# Patient Record
Sex: Male | Born: 1935
Health system: Southern US, Community
[De-identification: ages and names within clinical notes are randomized; demographics above are authoritative.]

## PROBLEM LIST (undated history)

## (undated) DIAGNOSIS — K219 Gastro-esophageal reflux disease without esophagitis: Secondary | ICD-10-CM

## (undated) DIAGNOSIS — R569 Unspecified convulsions: Secondary | ICD-10-CM

## (undated) DIAGNOSIS — M1990 Unspecified osteoarthritis, unspecified site: Secondary | ICD-10-CM

## (undated) DIAGNOSIS — R011 Cardiac murmur, unspecified: Secondary | ICD-10-CM

## (undated) DIAGNOSIS — I4891 Unspecified atrial fibrillation: Secondary | ICD-10-CM

## (undated) DIAGNOSIS — E785 Hyperlipidemia, unspecified: Secondary | ICD-10-CM

## (undated) DIAGNOSIS — I639 Cerebral infarction, unspecified: Secondary | ICD-10-CM

## (undated) HISTORY — PX: CARPAL TUNNEL RELEASE: SHX101

## (undated) HISTORY — DX: Unspecified atrial fibrillation: I48.91

## (undated) HISTORY — PX: TONSILLECTOMY: SUR1361

## (undated) HISTORY — DX: Unspecified convulsions: R56.9

## (undated) HISTORY — DX: Cardiac murmur, unspecified: R01.1

---

## 2000-10-10 ENCOUNTER — Ambulatory Visit (HOSPITAL_COMMUNITY): Admission: RE | Admit: 2000-10-10 | Discharge: 2000-10-10 | Payer: Self-pay | Admitting: *Deleted

## 2001-12-21 ENCOUNTER — Ambulatory Visit (HOSPITAL_COMMUNITY): Admission: RE | Admit: 2001-12-21 | Discharge: 2001-12-21 | Payer: Self-pay | Admitting: Family Medicine

## 2001-12-21 ENCOUNTER — Encounter: Payer: Self-pay | Admitting: Family Medicine

## 2001-12-22 ENCOUNTER — Ambulatory Visit: Admission: RE | Admit: 2001-12-22 | Discharge: 2001-12-22 | Payer: Self-pay | Admitting: Family Medicine

## 2002-01-02 ENCOUNTER — Encounter: Payer: Self-pay | Admitting: Family Medicine

## 2002-01-02 ENCOUNTER — Ambulatory Visit (HOSPITAL_COMMUNITY): Admission: RE | Admit: 2002-01-02 | Discharge: 2002-01-02 | Payer: Self-pay | Admitting: Family Medicine

## 2002-10-10 ENCOUNTER — Ambulatory Visit (HOSPITAL_COMMUNITY): Admission: RE | Admit: 2002-10-10 | Discharge: 2002-10-10 | Payer: Self-pay | Admitting: *Deleted

## 2002-10-10 ENCOUNTER — Encounter: Payer: Self-pay | Admitting: *Deleted

## 2004-02-06 ENCOUNTER — Encounter: Admission: RE | Admit: 2004-02-06 | Discharge: 2004-02-06 | Payer: Self-pay | Admitting: Neurology

## 2007-03-08 ENCOUNTER — Encounter (INDEPENDENT_AMBULATORY_CARE_PROVIDER_SITE_OTHER): Payer: Self-pay | Admitting: Urology

## 2008-04-22 ENCOUNTER — Encounter: Admission: RE | Admit: 2008-04-22 | Discharge: 2008-04-22 | Payer: Self-pay | Admitting: *Deleted

## 2010-12-25 NOTE — Op Note (Signed)
   NAME:  Nicholas Gaines, CORP NO.:  [id number]   MEDICAL RECORD NO.:  [id number]                   PATIENT TYPE:  AMB   LOCATION:  ENDO                                 FACILITY:  [facility]   PHYSICIAN:  Georgiana Spinner, M.D.                 DATE OF BIRTH:  [birth year]   DATE OF PROCEDURE:  10/10/2002  DATE OF DISCHARGE:                                 OPERATIVE REPORT   PROCEDURE:  Upper endoscopy.   ENDOSCOPIST:  Georgiana Spinner, M.D.   ANESTHESIA:  Demerol 40 mg, Versed 4 mg.   DESCRIPTION OF PROCEDURE:  With the patient mildly sedated in the left  lateral decubitus position, we attempted to do an endoscopy but we could get  the endoscope to pass through the upper esophageal sphincter.  Therefore,  after a number of tries we decided to stop the procedure and proceed with  upper GI.                                               Georgiana Spinner, M.D.    GMO/MEDQ  D:  10/10/2002  T:  10/10/2002  Job:  [id number]   cc:   Meredith Staggers, M.D.  [address]  [place]  [place] [address]  Fax: [phone]

## 2010-12-25 NOTE — Procedures (Signed)
[facility]  Patient:    Nicholas Gaines, Nicholas Gaines                       MRN: [id number] Proc. Date: 10/10/00 Adm. Date:  [id number] Attending:  Sabino Gasser CC:         Meredith Staggers, M.D.   Procedure Report  PROCEDURE:  Colonoscopy.  INDICATIONS:  [name] is a patient known to me in the past who is a former patient of Dr. Laurita Quint that is becoming a patient of Dr. Nettie Elm. He came to see me to tell me that he had been told of having recurrent iron deficiency anemia, therefore presumed heme-positive stools and because he has never had a colonoscopy, we suggested that he have one.  ANESTHESIA:  Demerol 40 mg, Versed 4 mg IV in divided doses.  DESCRIPTION OF PROCEDURE:  With the patient mildly sedated in the left lateral decubitus position, the Olympus videoscopic colonoscope was inserted into the rectum and passed under direct vision to the cecum after a normal rectal exam. The cecum was identified the ileocecal valve and appendiceal orifice, both of which were photographed.  We entered into the terminal ileum which also appeared normal and was photographed.  From this point the colonoscope was slowly withdrawn, taking circumferential views of the entire colonic mucosa stopping only in the rectum which appeared normal and the rectum showed internal hemorrhoids on retroflexed view.  The endoscope was straightened and withdrawn.  The patients vital signs and pulse oximeter readings were stable. The patient tolerated the procedure well without apparent complications.  FINDINGS: 1. Diverticulum of the sigmoid colon, mild. 2. Internal hemorrhoids, mild.  PLAN:  Follow-up with me will be as needed and with Dr. Dayton Scrape. DD:  10/10/00 TD:  10/11/00 Job: [id number]

## 2015-05-26 ENCOUNTER — Ambulatory Visit (INDEPENDENT_AMBULATORY_CARE_PROVIDER_SITE_OTHER): Payer: Medicare Other

## 2015-05-26 ENCOUNTER — Encounter: Payer: Self-pay | Admitting: Podiatry

## 2015-05-26 ENCOUNTER — Ambulatory Visit (INDEPENDENT_AMBULATORY_CARE_PROVIDER_SITE_OTHER): Payer: Medicare Other | Admitting: Podiatry

## 2015-05-26 VITALS — BP 102/75 | HR 66 | Resp 14 | Ht 66.0 in | Wt 160.0 lb

## 2015-05-26 DIAGNOSIS — M79671 Pain in right foot: Secondary | ICD-10-CM | POA: Diagnosis not present

## 2015-05-26 DIAGNOSIS — M1990 Unspecified osteoarthritis, unspecified site: Secondary | ICD-10-CM

## 2015-05-26 DIAGNOSIS — M19079 Primary osteoarthritis, unspecified ankle and foot: Secondary | ICD-10-CM

## 2015-05-26 NOTE — Progress Notes (Signed)
   Subjective:    Patient ID: Nicholas Gaines, male    DOB: 05/04/1929, 81 y.o.   MRN: [id number]  HPI  81 year old male presents the office today as referral from his insurance company , primary care doctor to evaluate a mass in the top of his right big toe. He states that he has had gout this toe several years ago after gout he noticed this mass for mallet toe. He states has remained unchanged from approximately 5 years and does not cause any pain. He is able to wear regular shoe without any difficulty. He denies any swelling . He denies any drainage.  No recent injury or trauma.No other complaints at this time.   Review of Systems  Musculoskeletal:       Joint pain Back pain   Hematological: Bruises/bleeds easily.  All other systems reviewed and are negative.      Objective:   Physical Exam General: AAO x3, NAD  Dermatological: Skin is warm, dry and supple bilateral. Nails x 10 are well manicured; remaining integument appears unremarkable at this time. There are no open sores, no preulcerative lesions, no rash or signs of infection present.  Vascular: Dorsalis Pedis artery and Posterior Tibial artery pedal pulses are 2/4 bilateral with immedate capillary fill time. Pedal hair growth present. No varicosities and no lower extremity edema present bilateral. There is no pain with calf compression, swelling, warmth, erythema.   Neruologic: Grossly intact via light touch bilateral. Vibratory intact via tuning fork bilateral. Protective threshold with Semmes Wienstein monofilament intact to all pedal sites bilateral. Patellar and Achilles deep tendon reflexes 2+ bilateral. No Babinski or clonus noted bilateral.   Musculoskeletal:  On the dorsal aspect of the right hallux IPJ there appears to be an arthritic exostosis palpable. There is no range of motion of the hallux IPJ however MPJ range of motion is intact. Hallux sits in the slightly contracted position. To smaller degree there is a small  bump on the left hallux IPJ as well.  There is no pain associated with the lesions. There is no areas of fluctuation or crepitation. No surrounding erythema, ascending cellulitis,  Malodor. No pain, crepitus, or limitation noted with foot and ankle range of motion bilateral. Muscular strength 5/5 in all groups tested bilateral.  Gait: Unassisted, Nonantalgic.       Assessment & Plan:   81 year old male with right hallux IPJ arthritis -X-rays were obtained and reviewed with the patient.  -Treatment options discussed including all alternatives, risks, and complications -Etiology of symptoms were discussed -At this point the lesion is in present for greater than 5 years does not cause any pain. Likely result of arthritis. We'll continue to monitor. If the area becomes more symptomatic or changed to call the office immediately.  Ovid CurdMatthew Ronasia Isola, DPM

## 2015-05-27 ENCOUNTER — Encounter: Payer: Self-pay | Admitting: Podiatry

## 2015-12-04 DIAGNOSIS — K219 Gastro-esophageal reflux disease without esophagitis: Secondary | ICD-10-CM | POA: Diagnosis not present

## 2015-12-04 DIAGNOSIS — E559 Vitamin D deficiency, unspecified: Secondary | ICD-10-CM | POA: Diagnosis not present

## 2015-12-04 DIAGNOSIS — E782 Mixed hyperlipidemia: Secondary | ICD-10-CM | POA: Diagnosis not present

## 2015-12-04 DIAGNOSIS — M1A0711 Idiopathic chronic gout, right ankle and foot, with tophus (tophi): Secondary | ICD-10-CM | POA: Diagnosis not present

## 2015-12-04 DIAGNOSIS — Z1389 Encounter for screening for other disorder: Secondary | ICD-10-CM | POA: Diagnosis not present

## 2015-12-04 DIAGNOSIS — N400 Enlarged prostate without lower urinary tract symptoms: Secondary | ICD-10-CM | POA: Diagnosis not present

## 2015-12-04 DIAGNOSIS — R7309 Other abnormal glucose: Secondary | ICD-10-CM | POA: Diagnosis not present

## 2015-12-04 DIAGNOSIS — Z23 Encounter for immunization: Secondary | ICD-10-CM | POA: Diagnosis not present

## 2015-12-04 DIAGNOSIS — R7303 Prediabetes: Secondary | ICD-10-CM | POA: Diagnosis not present

## 2015-12-04 DIAGNOSIS — Z0000 Encounter for general adult medical examination without abnormal findings: Secondary | ICD-10-CM | POA: Diagnosis not present

## 2015-12-04 DIAGNOSIS — Z8673 Personal history of transient ischemic attack (TIA), and cerebral infarction without residual deficits: Secondary | ICD-10-CM | POA: Diagnosis not present

## 2015-12-16 DIAGNOSIS — H6121 Impacted cerumen, right ear: Secondary | ICD-10-CM | POA: Diagnosis not present

## 2016-03-23 DIAGNOSIS — H524 Presbyopia: Secondary | ICD-10-CM | POA: Diagnosis not present

## 2016-03-23 DIAGNOSIS — H353121 Nonexudative age-related macular degeneration, left eye, early dry stage: Secondary | ICD-10-CM | POA: Diagnosis not present

## 2016-06-02 DIAGNOSIS — Z23 Encounter for immunization: Secondary | ICD-10-CM | POA: Diagnosis not present

## 2016-06-18 ENCOUNTER — Other Ambulatory Visit: Payer: Self-pay | Admitting: Family Medicine

## 2016-06-18 DIAGNOSIS — Z8673 Personal history of transient ischemic attack (TIA), and cerebral infarction without residual deficits: Secondary | ICD-10-CM

## 2016-06-18 DIAGNOSIS — I69392 Facial weakness following cerebral infarction: Secondary | ICD-10-CM | POA: Diagnosis not present

## 2016-06-18 DIAGNOSIS — R011 Cardiac murmur, unspecified: Secondary | ICD-10-CM | POA: Diagnosis not present

## 2016-06-18 DIAGNOSIS — R479 Unspecified speech disturbances: Secondary | ICD-10-CM

## 2016-06-22 ENCOUNTER — Ambulatory Visit
Admission: RE | Admit: 2016-06-22 | Discharge: 2016-06-22 | Disposition: A | Discharge status: Home / Self Care | Payer: Medicare Other | Source: Ambulatory Visit | Attending: Family Medicine | Admitting: Family Medicine

## 2016-06-22 DIAGNOSIS — Z8673 Personal history of transient ischemic attack (TIA), and cerebral infarction without residual deficits: Secondary | ICD-10-CM

## 2016-06-22 DIAGNOSIS — R410 Disorientation, unspecified: Secondary | ICD-10-CM | POA: Diagnosis not present

## 2016-06-22 DIAGNOSIS — R479 Unspecified speech disturbances: Secondary | ICD-10-CM

## 2016-06-22 DIAGNOSIS — R4781 Slurred speech: Secondary | ICD-10-CM | POA: Diagnosis not present

## 2016-06-24 ENCOUNTER — Other Ambulatory Visit: Payer: Self-pay

## 2016-06-28 DIAGNOSIS — R011 Cardiac murmur, unspecified: Secondary | ICD-10-CM | POA: Diagnosis not present

## 2016-07-12 ENCOUNTER — Other Ambulatory Visit: Payer: Self-pay | Admitting: Family Medicine

## 2016-07-19 ENCOUNTER — Other Ambulatory Visit (HOSPITAL_COMMUNITY): Payer: Self-pay | Admitting: Family Medicine

## 2016-07-19 DIAGNOSIS — R931 Abnormal findings on diagnostic imaging of heart and coronary circulation: Secondary | ICD-10-CM

## 2016-07-19 DIAGNOSIS — I5189 Other ill-defined heart diseases: Secondary | ICD-10-CM

## 2016-07-28 ENCOUNTER — Ambulatory Visit (HOSPITAL_COMMUNITY)
Admission: RE | Admit: 2016-07-28 | Discharge: 2016-07-28 | Disposition: A | Discharge status: Home / Self Care | Payer: Medicare Other | Source: Ambulatory Visit | Attending: Family Medicine | Admitting: Family Medicine

## 2016-07-28 DIAGNOSIS — K449 Diaphragmatic hernia without obstruction or gangrene: Secondary | ICD-10-CM | POA: Insufficient documentation

## 2016-07-28 DIAGNOSIS — I5189 Other ill-defined heart diseases: Secondary | ICD-10-CM

## 2016-07-28 DIAGNOSIS — R931 Abnormal findings on diagnostic imaging of heart and coronary circulation: Secondary | ICD-10-CM | POA: Insufficient documentation

## 2016-07-28 DIAGNOSIS — R222 Localized swelling, mass and lump, trunk: Secondary | ICD-10-CM | POA: Diagnosis not present

## 2016-07-28 DIAGNOSIS — I340 Nonrheumatic mitral (valve) insufficiency: Secondary | ICD-10-CM | POA: Diagnosis not present

## 2016-07-28 LAB — CREATININE, SERUM
Creatinine, Ser: 1.25 mg/dL — ABNORMAL HIGH (ref 0.61–1.24)
GFR calc Af Amer: 59 mL/min — ABNORMAL LOW
GFR calc non Af Amer: 51 mL/min — ABNORMAL LOW

## 2016-07-28 MED ORDER — GADOBENATE DIMEGLUMINE 529 MG/ML IV SOLN
24.0000 mL | Freq: Once | INTRAVENOUS | Status: AC | PRN
  Administered 2016-07-28: 24 mL via INTRAVENOUS

## 2016-08-01 DIAGNOSIS — R05 Cough: Secondary | ICD-10-CM | POA: Diagnosis not present

## 2016-08-01 DIAGNOSIS — J209 Acute bronchitis, unspecified: Secondary | ICD-10-CM | POA: Diagnosis not present

## 2016-08-02 ENCOUNTER — Emergency Department (HOSPITAL_COMMUNITY): Payer: Medicare Other

## 2016-08-02 ENCOUNTER — Observation Stay (HOSPITAL_COMMUNITY)
Admission: EM | Admit: 2016-08-02 | Discharge: 2016-08-03 | Disposition: A | Discharge status: Home / Self Care | Payer: Medicare Other | Attending: Internal Medicine | Admitting: Internal Medicine

## 2016-08-02 ENCOUNTER — Encounter (HOSPITAL_COMMUNITY): Payer: Self-pay | Admitting: Emergency Medicine

## 2016-08-02 DIAGNOSIS — Z8673 Personal history of transient ischemic attack (TIA), and cerebral infarction without residual deficits: Secondary | ICD-10-CM | POA: Diagnosis not present

## 2016-08-02 DIAGNOSIS — F29 Unspecified psychosis not due to a substance or known physiological condition: Secondary | ICD-10-CM | POA: Diagnosis not present

## 2016-08-02 DIAGNOSIS — M19012 Primary osteoarthritis, left shoulder: Secondary | ICD-10-CM | POA: Diagnosis not present

## 2016-08-02 DIAGNOSIS — J40 Bronchitis, not specified as acute or chronic: Secondary | ICD-10-CM | POA: Diagnosis present

## 2016-08-02 DIAGNOSIS — R05 Cough: Secondary | ICD-10-CM | POA: Diagnosis not present

## 2016-08-02 DIAGNOSIS — J209 Acute bronchitis, unspecified: Secondary | ICD-10-CM | POA: Insufficient documentation

## 2016-08-02 DIAGNOSIS — G9340 Encephalopathy, unspecified: Principal | ICD-10-CM | POA: Diagnosis present

## 2016-08-02 DIAGNOSIS — F23 Brief psychotic disorder: Secondary | ICD-10-CM | POA: Diagnosis not present

## 2016-08-02 DIAGNOSIS — M19011 Primary osteoarthritis, right shoulder: Secondary | ICD-10-CM | POA: Insufficient documentation

## 2016-08-02 DIAGNOSIS — M5134 Other intervertebral disc degeneration, thoracic region: Secondary | ICD-10-CM | POA: Diagnosis not present

## 2016-08-02 DIAGNOSIS — I639 Cerebral infarction, unspecified: Secondary | ICD-10-CM | POA: Diagnosis present

## 2016-08-02 DIAGNOSIS — E785 Hyperlipidemia, unspecified: Secondary | ICD-10-CM | POA: Diagnosis not present

## 2016-08-02 DIAGNOSIS — R569 Unspecified convulsions: Secondary | ICD-10-CM | POA: Insufficient documentation

## 2016-08-02 DIAGNOSIS — E782 Mixed hyperlipidemia: Secondary | ICD-10-CM | POA: Diagnosis present

## 2016-08-02 DIAGNOSIS — M5136 Other intervertebral disc degeneration, lumbar region: Secondary | ICD-10-CM | POA: Diagnosis not present

## 2016-08-02 DIAGNOSIS — R479 Unspecified speech disturbances: Secondary | ICD-10-CM | POA: Diagnosis not present

## 2016-08-02 DIAGNOSIS — K449 Diaphragmatic hernia without obstruction or gangrene: Secondary | ICD-10-CM | POA: Diagnosis not present

## 2016-08-02 DIAGNOSIS — K219 Gastro-esophageal reflux disease without esophagitis: Secondary | ICD-10-CM | POA: Diagnosis present

## 2016-08-02 DIAGNOSIS — F4489 Other dissociative and conversion disorders: Secondary | ICD-10-CM | POA: Diagnosis not present

## 2016-08-02 DIAGNOSIS — Z7982 Long term (current) use of aspirin: Secondary | ICD-10-CM | POA: Insufficient documentation

## 2016-08-02 DIAGNOSIS — G459 Transient cerebral ischemic attack, unspecified: Secondary | ICD-10-CM | POA: Diagnosis present

## 2016-08-02 DIAGNOSIS — Z79899 Other long term (current) drug therapy: Secondary | ICD-10-CM | POA: Diagnosis not present

## 2016-08-02 DIAGNOSIS — R4701 Aphasia: Secondary | ICD-10-CM | POA: Diagnosis not present

## 2016-08-02 HISTORY — DX: Hyperlipidemia, unspecified: E78.5

## 2016-08-02 HISTORY — DX: Unspecified osteoarthritis, unspecified site: M19.90

## 2016-08-02 HISTORY — DX: Cerebral infarction, unspecified: I63.9

## 2016-08-02 HISTORY — DX: Gastro-esophageal reflux disease without esophagitis: K21.9

## 2016-08-02 LAB — I-STAT CG4 LACTIC ACID, ED
LACTIC ACID, VENOUS: 1.09 mmol/L (ref 0.5–1.9)
Lactic Acid, Venous: 0.88 mmol/L (ref 0.5–1.9)

## 2016-08-02 LAB — I-STAT TROPONIN, ED: TROPONIN I, POC: 0.01 ng/mL (ref 0.00–0.08)

## 2016-08-02 LAB — COMPREHENSIVE METABOLIC PANEL
ALBUMIN: 3.7 g/dL (ref 3.5–5.0)
ALT: 15 U/L — AB (ref 17–63)
AST: 24 U/L (ref 15–41)
Alkaline Phosphatase: 113 U/L (ref 38–126)
Anion gap: 6 (ref 5–15)
BUN: 15 mg/dL (ref 6–20)
CHLORIDE: 103 mmol/L (ref 101–111)
CO2: 28 mmol/L (ref 22–32)
CREATININE: 1.22 mg/dL (ref 0.61–1.24)
Calcium: 8.8 mg/dL — ABNORMAL LOW (ref 8.9–10.3)
GFR calc Af Amer: >60 mL/min (ref >60)
GFR, EST NON AFRICAN AMERICAN: 52 mL/min — AB (ref >60)
GLUCOSE: 113 mg/dL — AB (ref 65–99)
Potassium: 3.5 mmol/L (ref 3.5–5.1)
Sodium: 137 mmol/L (ref 135–145)
Total Bilirubin: 0.6 mg/dL (ref 0.3–1.2)
Total Protein: 6.7 g/dL (ref 6.5–8.1)

## 2016-08-02 LAB — URINALYSIS, ROUTINE W REFLEX MICROSCOPIC
BILIRUBIN URINE: NEGATIVE
Glucose, UA: NEGATIVE mg/dL
Hgb urine dipstick: NEGATIVE
Ketones, ur: NEGATIVE mg/dL
Leukocytes, UA: NEGATIVE
NITRITE: NEGATIVE
PH: 5 (ref 5.0–8.0)
Protein, ur: NEGATIVE mg/dL
SPECIFIC GRAVITY, URINE: 1.006 (ref 1.005–1.030)

## 2016-08-02 LAB — I-STAT CHEM 8, ED
BUN: 19 mg/dL (ref 6–20)
CHLORIDE: 102 mmol/L (ref 101–111)
CREATININE: 1.1 mg/dL (ref 0.61–1.24)
Calcium, Ion: 1.16 mmol/L (ref 1.15–1.40)
Glucose, Bld: 110 mg/dL — ABNORMAL HIGH (ref 65–99)
HEMATOCRIT: 33 % — AB (ref 39.0–52.0)
Hemoglobin: 11.2 g/dL — ABNORMAL LOW (ref 13.0–17.0)
POTASSIUM: 4 mmol/L (ref 3.5–5.1)
SODIUM: 140 mmol/L (ref 135–145)
TCO2: 27 mmol/L (ref 0–100)

## 2016-08-02 LAB — DIFFERENTIAL
BASOS ABS: 0 10*3/uL (ref 0.0–0.1)
BASOS PCT: 0 %
Eosinophils Absolute: 0 10*3/uL (ref 0.0–0.7)
Eosinophils Relative: 0 %
Lymphocytes Relative: 18 %
Lymphs Abs: 2.4 10*3/uL (ref 0.7–4.0)
MONOS PCT: 8 %
Monocytes Absolute: 1 10*3/uL (ref 0.1–1.0)
NEUTROS ABS: 9.5 10*3/uL — AB (ref 1.7–7.7)
NEUTROS PCT: 74 %

## 2016-08-02 LAB — INFLUENZA PANEL BY PCR (TYPE A & B)
INFLBPCR: NEGATIVE
Influenza A By PCR: NEGATIVE

## 2016-08-02 LAB — CBC
HEMATOCRIT: 36 % — AB (ref 39.0–52.0)
HEMOGLOBIN: 12 g/dL — AB (ref 13.0–17.0)
MCH: 30.2 pg (ref 26.0–34.0)
MCHC: 33.3 g/dL (ref 30.0–36.0)
MCV: 90.5 fL (ref 78.0–100.0)
Platelets: 167 10*3/uL (ref 150–400)
RBC: 3.98 MIL/uL — ABNORMAL LOW (ref 4.22–5.81)
RDW: 13.6 % (ref 11.5–15.5)
WBC: 13 10*3/uL — AB (ref 4.0–10.5)

## 2016-08-02 LAB — PROTIME-INR
INR: 1.07
Prothrombin Time: 13.9 seconds (ref 11.4–15.2)

## 2016-08-02 LAB — CBG MONITORING, ED: Glucose-Capillary: 110 mg/dL — ABNORMAL HIGH (ref 65–99)

## 2016-08-02 LAB — APTT: APTT: 37 s — AB (ref 24–36)

## 2016-08-02 MED ORDER — SENNOSIDES-DOCUSATE SODIUM 8.6-50 MG PO TABS: 1.0000 | ORAL_TABLET | Freq: Every evening | ORAL | Status: DC | PRN

## 2016-08-02 MED ORDER — HYDROCODONE-HOMATROPINE 5-1.5 MG/5ML PO SYRP: 5.0000 mL | ORAL_SOLUTION | Freq: Four times a day (QID) | ORAL | Status: DC | PRN

## 2016-08-02 MED ORDER — EZETIMIBE 10 MG PO TABS
10.0000 mg | ORAL_TABLET | Freq: Every day | ORAL | Status: DC
  Administered 2016-08-03: 10 mg via ORAL
  Filled 2016-08-02: qty 1

## 2016-08-02 MED ORDER — DM-GUAIFENESIN ER 30-600 MG PO TB12
1.0000 | ORAL_TABLET | Freq: Two times a day (BID) | ORAL | Status: DC
  Administered 2016-08-03 (×2): 1 via ORAL
  Filled 2016-08-02 (×2): qty 1

## 2016-08-02 MED ORDER — VITAMIN B-12 1000 MCG PO TABS
2500.0000 ug | ORAL_TABLET | Freq: Every day | ORAL | Status: DC
  Administered 2016-08-03: 2500 ug via ORAL
  Filled 2016-08-02: qty 3

## 2016-08-02 MED ORDER — ALBUTEROL SULFATE (2.5 MG/3ML) 0.083% IN NEBU: 2.5000 mg | INHALATION_SOLUTION | RESPIRATORY_TRACT | Status: DC | PRN

## 2016-08-02 MED ORDER — AZITHROMYCIN 250 MG PO TABS
250.0000 mg | ORAL_TABLET | Freq: Every day | ORAL | Status: DC
  Administered 2016-08-03: 250 mg via ORAL
  Filled 2016-08-02: qty 1

## 2016-08-02 MED ORDER — ONDANSETRON HCL 4 MG/2ML IJ SOLN: 4.0000 mg | Freq: Three times a day (TID) | INTRAMUSCULAR | Status: DC | PRN

## 2016-08-02 MED ORDER — CLOPIDOGREL BISULFATE 75 MG PO TABS
75.0000 mg | ORAL_TABLET | Freq: Every day | ORAL | Status: DC
  Administered 2016-08-03: 75 mg via ORAL
  Filled 2016-08-02: qty 1

## 2016-08-02 MED ORDER — LORAZEPAM 2 MG/ML IJ SOLN: 1.0000 mg | INTRAMUSCULAR | Status: DC | PRN

## 2016-08-02 MED ORDER — SAW PALMETTO 450 MG PO CAPS: 450.0000 | ORAL_CAPSULE | Freq: Every day | ORAL | Status: DC

## 2016-08-02 MED ORDER — VITAMIN D 1000 UNITS PO TABS
1000.0000 [IU] | ORAL_TABLET | Freq: Every day | ORAL | Status: DC
  Administered 2016-08-03: 1000 [IU] via ORAL
  Filled 2016-08-02: qty 1

## 2016-08-02 MED ORDER — FAMOTIDINE 20 MG PO TABS
20.0000 mg | ORAL_TABLET | Freq: Two times a day (BID) | ORAL | Status: DC
  Administered 2016-08-03 (×2): 20 mg via ORAL
  Filled 2016-08-02 (×2): qty 1

## 2016-08-02 MED ORDER — FERROUS SULFATE 325 (65 FE) MG PO TABS
325.0000 mg | ORAL_TABLET | Freq: Every day | ORAL | Status: DC
Start: 2016-08-03 — End: 2016-08-03
  Administered 2016-08-03: 325 mg via ORAL
  Filled 2016-08-02: qty 1

## 2016-08-02 MED ORDER — ASPIRIN 325 MG PO TABS
325.0000 mg | ORAL_TABLET | Freq: Every day | ORAL | Status: DC
  Administered 2016-08-03: 325 mg via ORAL
  Filled 2016-08-02: qty 1

## 2016-08-02 MED ORDER — ZOLPIDEM TARTRATE 5 MG PO TABS: 5.0000 mg | ORAL_TABLET | Freq: Every evening | ORAL | Status: DC | PRN

## 2016-08-02 MED ORDER — LYCOPENE 5 MG PO CAPS: 1.0000 | ORAL_CAPSULE | Freq: Every day | ORAL | Status: DC

## 2016-08-02 MED ORDER — ENOXAPARIN SODIUM 40 MG/0.4ML ~~LOC~~ SOLN
40.0000 mg | Freq: Every day | SUBCUTANEOUS | Status: DC
  Administered 2016-08-03: 40 mg via SUBCUTANEOUS
  Filled 2016-08-02: qty 0.4

## 2016-08-02 MED ORDER — ACETAMINOPHEN 325 MG PO TABS: 650.0000 mg | ORAL_TABLET | Freq: Four times a day (QID) | ORAL | Status: DC | PRN

## 2016-08-02 MED ORDER — STROKE: EARLY STAGES OF RECOVERY BOOK
Freq: Once | Status: AC
  Administered 2016-08-03: 01:00:00
  Filled 2016-08-02: qty 1

## 2016-08-02 NOTE — ED Notes (Signed)
Pt returned from X Ray.

## 2016-08-02 NOTE — ED Provider Notes (Signed)
[facility]-EMERGENCY DEPT Provider Note   CSN: [id number] Arrival date & time: 08/02/16  1913     History   Chief Complaint Chief Complaint  Patient presents with  . Altered Mental Status    HPI Lucendia Herrlichllen Shirer is a 82 y.o. male.  HPI   Patient is an 82 year old male presenting with fever, cough and episodic confusion. Patient had 2 episodes in the last 3 days where he's become discretely confused. Patient says it's similar to his last TIA in November. (diangosed by PCP)  Family reports that he's been having on and off fevers, shortness of breath, cough. Recently started on antibiotics.  One of the episodes occurred today his daughter called him and he was unable to speak. Patient reports later that he remebers trying to speak and not being able to.  Patient is back to baseline now.  Pt had outpatient work up for TIA with his PCP that showed MRI with old stroke (2002) and is gettinga loop recorder for ? Paroxysmal afib    Past Medical History:  Diagnosis Date  . Arthritis   . Stroke [facility](HCC)    tia    There are no active problems to display for this patient.   Past Surgical History:  Procedure Laterality Date  . TONSILLECTOMY         Home Medications    Prior to Admission medications   Medication Sig Start Date End Date Taking? Authorizing Provider  aspirin 325 MG tablet Take 325 mg by mouth daily.   Yes Historical Provider, MD  azithromycin (ZITHROMAX) 250 MG tablet Take 250 mg by mouth daily.   Yes Historical Provider, MD  clopidogrel (PLAVIX) 75 MG tablet Take 75 mg by mouth daily.  05/22/15  Yes Historical Provider, MD  Cyanocobalamin (B-12) 2500 MCG TABS Take by mouth.   Yes Historical Provider, MD  HYDROMET 5-1.5 MG/5ML syrup Take 5 mLs by mouth 4 (four) times daily as needed for cough. 08/01/16  Yes Historical Provider, MD  IRON, FERROUS GLUCONATE, PO Take 65 g by mouth daily.   Yes Historical Provider, MD  LYCOPENE PO Take 1 tablet by mouth daily.   Yes  Historical Provider, MD  ranitidine (ZANTAC) 150 MG tablet Take 150 mg by mouth 2 (two) times daily.  05/22/15  Yes Historical Provider, MD  Saw Palmetto 450 MG CAPS Take 450 capsules by mouth daily.   Yes Historical Provider, MD  ZETIA 10 MG tablet Take 10 mg by mouth daily.  05/22/15  Yes Historical Provider, MD    Family History No family history on file.  Social History Social History  Substance Use Topics  . Smoking status: Never Smoker  . Smokeless tobacco: Never Used  . Alcohol use No     Allergies   Rocephin [ceftriaxone sodium in dextrose]   Review of Systems Review of Systems  Constitutional: Positive for fatigue and fever.  Respiratory: Positive for cough and shortness of breath.   Cardiovascular: Negative for chest pain.  Gastrointestinal: Negative for abdominal pain.  Neurological: Positive for speech difficulty and weakness.  All other systems reviewed and are negative.    Physical Exam Updated Vital Signs BP 135/82   Pulse 68   Temp 98.5 F (36.9 C) (Oral)   Resp 18   Ht 5\' 6"  (1.676 m)   Wt 155 lb (70.3 kg)   SpO2 98%   BMI 25.02 kg/m   Physical Exam  Constitutional: He is oriented to person, place, and time. He appears well-nourished.  HENT:  Head: Normocephalic.  Eyes: Conjunctivae are normal.  Cardiovascular: Normal rate, regular rhythm and normal heart sounds.   Pulmonary/Chest: Effort normal. He has no wheezes.  Mild tachypnea.  Neurological: He is oriented to person, place, and time. No cranial nerve deficit. Coordination normal.  Equal strength bilaterally upper and lower extremities negative pronator drift. Normal sensation bilaterally. Speech comprehensible, no slurring. Facial nerve tested and appears grossly normal. Alert and oriented 3.   Skin: Skin is warm and dry. He is not diaphoretic.  Psychiatric: He has a normal mood and affect. His behavior is normal.     ED Treatments / Results  Labs (all labs ordered are listed, but  only abnormal results are displayed) Labs Reviewed  APTT - Abnormal; Notable for the following:       Result Value   aPTT 37 (*)    All other components within normal limits  CBC - Abnormal; Notable for the following:    WBC 13.0 (*)    RBC 3.98 (*)    Hemoglobin 12.0 (*)    HCT 36.0 (*)    All other components within normal limits  DIFFERENTIAL - Abnormal; Notable for the following:    Neutro Abs 9.5 (*)    All other components within normal limits  COMPREHENSIVE METABOLIC PANEL - Abnormal; Notable for the following:    Glucose, Bld 113 (*)    Calcium 8.8 (*)    ALT 15 (*)    GFR calc non Af Amer 52 (*)    All other components within normal limits  URINALYSIS, ROUTINE W REFLEX MICROSCOPIC - Abnormal; Notable for the following:    Color, Urine STRAW (*)    All other components within normal limits  I-STAT CHEM 8, ED - Abnormal; Notable for the following:    Glucose, Bld 110 (*)    Hemoglobin 11.2 (*)    HCT 33.0 (*)    All other components within normal limits  CULTURE, BLOOD (ROUTINE X 2)  CULTURE, BLOOD (ROUTINE X 2)  URINE CULTURE  PROTIME-INR  INFLUENZA PANEL BY PCR (TYPE A & B, H1N1)  I-STAT TROPOININ, ED  CBG MONITORING, ED  I-STAT CG4 LACTIC ACID, ED    EKG  EKG Interpretation  Date/Time:  Monday August 02 2016 19:14:09 EST Ventricular Rate:  72 PR Interval:    QRS Duration: 98 QT Interval:  407 QTC Calculation: 446 R Axis:   -22 Text Interpretation:  Sinus rhythm Atrial premature complexes Probable left atrial enlargement Borderline left axis deviation RSR' in V1 or V2, probably normal variant Premature atrial complexes Confirmed by Kandis MannanMACKUEN, COURTNEY ([id number]) on 08/02/2016 9:14:01 PM       Radiology Dg Chest 2 View  Result Date: 08/02/2016 CLINICAL DATA:  Cough x3 weeks EXAM: CHEST  2 VIEW COMPARISON:  08/30/2012 FINDINGS: Stable large hiatal hernia. Top normal size cardiac silhouette. Mild uncoiling of the thoracic aorta. No pneumonic consolidation,  CHF nor effusion. Mild diffuse interstitial prominence may reflect bronchitic change. Osteoarthritic change is noted about both glenohumeral joints with periarticular soft tissue calcifications seen bilaterally. Multilevel degenerative disc disease of the visualized thoracic and lumbar spine consistent spondylosis. IMPRESSION: Stable large hiatal hernia. Mild diffuse interstitial prominence may reflect bronchitic change. Electronically Signed   By: Tollie Ethavid  Kwon M.D.   On: 08/02/2016 21:33    Procedures Procedures (including critical care time)  Medications Ordered in ED Medications - No data to display   Initial Impression / Assessment and Plan / ED Course  I have reviewed  the triage vital signs and the nursing notes.  Pertinent labs & imaging results that were available during my care of the patient were reviewed by me and considered in my medical decision making (see chart for details).  Clinical Course     Patient is an 82 year old male presenting with fever, cough and episodic confusion. Patient had 2 episodes where he became confused.   Although it is most likely this is from infection, patient does say it feels like his last TIA, and I'm concerned because they were so discrete in nature and have since resolved. Patient's family concernd give that was episodic, not constant. Given this I would consider admission him for TIA. Inability to speak not classic for confusion associated with elderly infection.   Urine, flu and CXR pending. ? Flu.    Discussed with nuerology.  They will weigh in for TIA vs confusion from infection. No source of infection found at time of admission.     Final Clinical Impressions(s) / ED Diagnoses   Final diagnoses:  None    New Prescriptions New Prescriptions   No medications on file     Courteney Randall An, MD 08/03/16 [phone]

## 2016-08-02 NOTE — ED Notes (Signed)
Initial NIH Score: 0 Last Neuro Check: 0 No change in neuro status Swallow Screen: Passed Ambulation status: Stand-by assist [place]any P. RN. [phone]

## 2016-08-02 NOTE — ED Notes (Signed)
Attempted to call report x1.  No answer on 43M.

## 2016-08-02 NOTE — Progress Notes (Signed)
PHARMACIST - PHYSICIAN ORDER COMMUNICATION  CONCERNING: P&T Medication Policy on Herbal Medications  DESCRIPTION:  This patient's order for:  Lycopene and Saw Palmetto  has been noted.  These products are classified as herbal" or natural products. Due to a lack of definitive safety studies or FDA approval, nonstandard manufacturing practices, plus the potential risk of unknown drug-drug interactions while on inpatient medications, the Pharmacy and Therapeutics Committee does not permit the use of "herbal" or natural products of this type within [facility].   ACTION TAKEN: The pharmacy department is unable to verify these orders at this time and your patient has been informed of this safety policy. Please reevaluate patient's clinical condition at discharge and address if the herbal or natural product(s) should be resumed at that time.

## 2016-08-02 NOTE — ED Triage Notes (Addendum)
Pt arrives from home via [facility] c/o intermittent confusion for 2 days with fever and cough.  Pt reports seeing PCM recently for cough, diagnosed with bronchitis.  Pt denies pain, LOC, recent falls.  No focal deficits noted at this time. Pt's family reports pt LSN 1600 today, aphasia with dysarthria noted at 1800.  All s/s resolved at this time. Dr. Corlis LeakMacKuen made aware/

## 2016-08-02 NOTE — ED Notes (Signed)
Pt and family made aware of bed assignment 

## 2016-08-02 NOTE — ED Notes (Signed)
Hospitalist and Neurologist at bedside at this time.  Swallow screen deferred.

## 2016-08-02 NOTE — H&P (Addendum)
History and Physical    Nicholas Gaines ZOX:[id number] DOB: 03/22/1929 DOA: 08/02/2016  Referring MD/NP/PA:   PCP: Sissy HoffSWAYNE,DAVID W, MD   Patient coming from:  The patient is coming from home.  At baseline, pt is partially dependent for most of ADL.   Chief Complaint: Cough, fever, AMS, staring, and difficulty speaking  HPI: Nicholas Herrlichllen Starliper is a 82 y.o. male with medical history significant of TIA, stroke, hyperlipidemia, GERD, arthritis, who presents with cough, altered mental status and difficulty speaking.  Per pt's daughrer, patient has been coughing in the past 2 days he coughs up light green colored mucus come but no chest pain or shortness of breath. He also has sore throat and runny nose. Patient was seen in clinic, and diagnosed as bronchitis, was given prescription of Azigthromycine yesterday. His daughter states that the patient had 2 episode of confusion, disorientation and staring, which was associated with difficulty speaking. Episode lasted for about 30-60 minutes, then resolved spontaneously. Patient did not have unilateral weakness, numbness, vision change or hearing loss. Patient does not have chest pain, shortness of breath. He had one episode of fever with temperature of 102.2 yesterday, which has resolved. He also had nausea and vomited once, no abdominal pain or diarrhea. Denies symptoms of UTI or rashes. No leg edema. Per patient's daughter, pt had outpatient work up for TIA, and had MRI with old stroke (2002), planning to have loop recorder for ? Paroxysmal afib.   ED Course: pt was found to have WBC 13.0, lactate is 3.88, INR 1.07, creatinine 1.10, temperature normal, oxygen saturation 98% on room air, chest x-ray showed bronchitic change and a large hiatal hernia, CT head negative for acute intracranial abnormalities. Patient is placed on telemetry bed for observation. Neurology, Dr. Nicholas LoseEshraghi was consulted.  Review of Systems:   General: has fevers, chills, no changes in  body weight, has poor appetite, has fatigue HEENT: no blurry vision, hearing changes or sore throat Respiratory: no dyspnea, has coughing, no wheezing CV: no chest pain, no palpitations GI: had nausea, vomiting, no abdominal pain, diarrhea, constipation GU: no dysuria, burning on urination, increased urinary frequency, hematuria  Ext: no leg edema Neuro: no unilateral weakness, numbness, or tingling, no vision change or hearing loss Skin: no rash, no skin tear. MSK: No muscle spasm, no deformity, no limitation of range of movement in spin Heme: No easy bruising.  Travel history: No recent long distant travel.  Allergy:  Allergies  Allergen Reactions  . Rocephin [Ceftriaxone Sodium In Dextrose] Other (See Comments)    C-Diff    Past Medical History:  Diagnosis Date  . Arthritis   . GERD (gastroesophageal reflux disease)   . HLD (hyperlipidemia)   . Stroke [facility](HCC)    tia    Past Surgical History:  Procedure Laterality Date  . TONSILLECTOMY      Social History:  reports that he has never smoked. He has never used smokeless tobacco. He reports that he does not drink alcohol or use drugs.  Family History:  Family History  Problem Relation Age of Onset  . Hypertension Mother   . Parkinson's disease Mother   . Heart attack Mother   . Stroke Sister   . Stroke Brother      Prior to Admission medications   Medication Sig Start Date End Date Taking? Authorizing Provider  acetaminophen (TYLENOL) 500 MG tablet Take 1,000 mg by mouth every 6 (six) hours as needed for fever.    Yes Historical Provider, MD  aspirin  325 MG tablet Take 325 mg by mouth daily.   Yes Historical Provider, MD  azithromycin (ZITHROMAX) 250 MG tablet Take 250 mg by mouth daily. Started 12/24, for 5 days ending 12/28   Yes Historical Provider, MD  Cholecalciferol 1000 units capsule Take 1,000 Units by mouth daily.   Yes Historical Provider, MD  clopidogrel (PLAVIX) 75 MG tablet Take 75 mg by mouth daily.   05/22/15  Yes Historical Provider, MD  Cyanocobalamin (B-12) 2500 MCG TABS Take 2,500 mcg by mouth daily.    Yes Historical Provider, MD  ferrous sulfate 325 (65 FE) MG EC tablet Take 325 mg by mouth daily with breakfast.   Yes Historical Provider, MD  HYDROMET 5-1.5 MG/5ML syrup Take 5 mLs by mouth 4 (four) times daily as needed for cough. 08/01/16  Yes Historical Provider, MD  ibuprofen (ADVIL,MOTRIN) 200 MG tablet Take 600 mg by mouth every 6 (six) hours as needed for fever.   Yes Historical Provider, MD  LYCOPENE PO Take 1 tablet by mouth daily.   Yes Historical Provider, MD  ranitidine (ZANTAC) 150 MG tablet Take 150 mg by mouth 2 (two) times daily.  05/22/15  Yes Historical Provider, MD  Saw Palmetto 450 MG CAPS Take 450 capsules by mouth daily.   Yes Historical Provider, MD  ZETIA 10 MG tablet Take 10 mg by mouth daily.  05/22/15  Yes Historical Provider, MD    Physical Exam: Vitals:   08/02/16 2200 08/02/16 2230 08/02/16 2245 08/02/16 2246  BP: 136/82 143/87 151/80   Pulse: 71 (!) 56 68   Resp: 15 11 17    Temp:    98 F (36.7 C)  TempSrc:      SpO2: 97% 99% 97%   Weight:      Height:       General: Not in acute distress HEENT:       Eyes: PERRL, EOMI, no scleral icterus.       ENT: No discharge from the ears and nose, no pharynx injection, no tonsillar enlargement.        Neck: No JVD, no bruit, no mass felt. Heme: No neck lymph node enlargement. Cardiac: S1/S2, RRR, No murmurs, No gallops or rubs. Respiratory: No rales, wheezing, rhonchi or rubs. GI: Soft, nondistended, nontender, no rebound pain, no organomegaly, BS present. GU: No hematuria Ext: No pitting leg edema bilaterally. 2+DP/PT pulse bilaterally. Musculoskeletal: No joint deformities, No joint redness or warmth, no limitation of ROM in spin. Skin: No rashes.  Neuro: Alert, oriented X3, cranial nerves II-XII grossly intact, moves all extremities normally. Muscle strength 5/5 in all extremities, sensation to  light touch intact. Brachial reflex 2+ bilaterally.  Negative Babinski's sign. Normal finger to nose test. Psych: Patient is not psychotic, no suicidal or hemocidal ideation.  Labs on Admission: I have personally reviewed following labs and imaging studies  CBC:  Recent Labs Lab 08/02/16 1933 08/02/16 2015  WBC 13.0*  --   NEUTROABS 9.5*  --   HGB 12.0* 11.2*  HCT 36.0* 33.0*  MCV 90.5  --   PLT 167  --    Basic Metabolic Panel:  Recent Labs Lab 07/28/16 1510 08/02/16 1933 08/02/16 2015  NA  --  137 140  K  --  3.5 4.0  CL  --  103 102  CO2  --  28  --   GLUCOSE  --  113* 110*  BUN  --  15 19  CREATININE 1.25* 1.22 1.10  CALCIUM  --  8.8*  --    GFR: Estimated Creatinine Clearance: 44.3 mL/min (by C-G formula based on SCr of 1.1 mg/dL). Liver Function Tests:  Recent Labs Lab 08/02/16 1933  AST 24  ALT 15*  ALKPHOS 113  BILITOT 0.6  PROT 6.7  ALBUMIN 3.7   No results for input(s): LIPASE, AMYLASE in the last 168 hours. No results for input(s): AMMONIA in the last 168 hours. Coagulation Profile:  Recent Labs Lab 08/02/16 1933  INR 1.07   Cardiac Enzymes: No results for input(s): CKTOTAL, CKMB, CKMBINDEX, TROPONINI in the last 168 hours. BNP (last 3 results) No results for input(s): PROBNP in the last 8760 hours. HbA1C: No results for input(s): HGBA1C in the last 72 hours. CBG:  Recent Labs Lab 08/02/16 2023  GLUCAP 110*   Lipid Profile: No results for input(s): CHOL, HDL, LDLCALC, TRIG, CHOLHDL, LDLDIRECT in the last 72 hours. Thyroid Function Tests: No results for input(s): TSH, T4TOTAL, FREET4, T3FREE, THYROIDAB in the last 72 hours. Anemia Panel: No results for input(s): VITAMINB12, FOLATE, FERRITIN, TIBC, IRON, RETICCTPCT in the last 72 hours. Urine analysis:    Component Value Date/Time   COLORURINE STRAW (A) 08/02/2016 2039   APPEARANCEUR CLEAR 08/02/2016 2039   LABSPEC 1.006 08/02/2016 2039   PHURINE 5.0 08/02/2016 2039    GLUCOSEU NEGATIVE 08/02/2016 2039   HGBUR NEGATIVE 08/02/2016 2039   BILIRUBINUR NEGATIVE 08/02/2016 2039   KETONESUR NEGATIVE 08/02/2016 2039   PROTEINUR NEGATIVE 08/02/2016 2039   NITRITE NEGATIVE 08/02/2016 2039   LEUKOCYTESUR NEGATIVE 08/02/2016 2039   Sepsis Labs: @LABRCNTIP (procalcitonin:4,lacticidven:4) )No results found for this or any previous visit (from the past 240 hour(s)).   Radiological Exams on Admission: Dg Chest 2 View  Result Date: 08/02/2016 CLINICAL DATA:  Cough x3 weeks EXAM: CHEST  2 VIEW COMPARISON:  08/30/2012 FINDINGS: Stable large hiatal hernia. Top normal size cardiac silhouette. Mild uncoiling of the thoracic aorta. No pneumonic consolidation, CHF nor effusion. Mild diffuse interstitial prominence may reflect bronchitic change. Osteoarthritic change is noted about both glenohumeral joints with periarticular soft tissue calcifications seen bilaterally. Multilevel degenerative disc disease of the visualized thoracic and lumbar spine consistent spondylosis. IMPRESSION: Stable large hiatal hernia. Mild diffuse interstitial prominence may reflect bronchitic change. Electronically Signed   By: Tollie Eth M.D.   On: 08/02/2016 21:33   Ct Head Wo Contrast  Result Date: 08/02/2016 CLINICAL DATA:  Aphasia EXAM: CT HEAD WITHOUT CONTRAST TECHNIQUE: Contiguous axial images were obtained from the base of the skull through the vertex without intravenous contrast. COMPARISON:  Brain MRI 06/22/2016 FINDINGS: Brain: No mass lesion, intraparenchymal hemorrhage or extra-axial collection. No evidence of acute cortical infarct. There is periventricular hypoattenuation compatible with chronic microvascular disease. Vascular: No hyperdense vessel or unexpected calcification. Skull: Normal visualized skull base, calvarium and extracranial soft tissues. Sinuses/Orbits: No sinus fluid levels or advanced mucosal thickening. No mastoid effusion. Normal orbits. IMPRESSION: Chronic microvascular  ischemia without acute intracranial abnormality. Electronically Signed   By: Deatra Robinson M.D.   On: 08/02/2016 22:24     EKG: Independently reviewed.  Sinus rhythm, PAC, LAE, poor R-wave progression   Assessment/Plan Principal Problem:   Acute encephalopathy Active Problems:   Stroke (HCC)   Difficulty speaking   Bronchitis   HLD (hyperlipidemia)   GERD (gastroesophageal reflux disease)   Transient cerebral ischemia   Acute encephalopathy and Difficulty speaking: Etiology is not clear. CT head was negative for acute intracranial abnormalities. Neurology was consulted. Per Dr. Nicholas Lose, pt's symptoms are suggestive of complex partial  seizures and not TIAs.  No need to repeat MRI since recent MRI did not show any specific lesion.   -will place on tele bed for obs -Seizure precaution -When necessary Ativan for seizure - Dr. Nicholas Lose recommended routine EEG.  If normal, may consider more prolonged inpatient EEG and/or outpatient 72 hour ambulatory EEG. - will load pt with 1 g of keppra since Dr. Nicholas Lose "I would seriously consider initiating an AED since he has had 3 events already even if his EEG work up is negative".  Hx of stroke and TIA: -continue aspirin, Zetia  Bronchitis: Patient is not septic. Hemodynamically stable. Lactate is normal. -continue azithromycin - Mucinex for cough  - prn Albuterol Nebs for SOB - Urine S. pneumococcal antigen - Follow up blood culture x2, sputum culture and Flu pcr  HLD: Last LDL was not on record -Continue home medications: Zetia  GERD: -Pepcid  DVT ppx: SQ Lovenox Code Status: Full code Family Communication: Yes, patient's Daughter and son-in-law at bed side Disposition Plan:  Anticipate discharge back to previous home environment Consults called:  Neurology, Dr. Nicholas Lose Admission status: Obs / tele     Date of Service 08/02/2016    Lorretta Harp [facility] Pager [phone]  If 7PM-7AM, please contact  night-coverage [email or web address] Password [email or web address] 08/02/2016, 11:42 PM

## 2016-08-02 NOTE — Consult Note (Signed)
Reason for Consult: ?TIAs Referring Physician:  Internal medicine hospitalist  Nicholas Gaines is an 82 y.o. male.  HPI: Daughter gives history as well.  He has had one episode in 06/2016 and 2 episodes over the last 2 days consistent of discrete periods of staring, confusion, disorientation, and unintelligible speech.  He is usually back to normal by 1/2 hour.  He has no distinct recollection of the events themselves, but only slightly before and after them.  No weakness or numbness or incoordination or gait disturbances.  No headaches.  He has had some mild low grade fevers thought to be possible bronchitis, although he is not coughing right now.    CT Brain reviewed personally and is without blood or hypodensity.  MRI Brain from 06/2016 was reviewed personally and shows atrophy and periventricular small vessel ischemic disease.  Nothing acute.  No tumors.    Past Medical History:  Diagnosis Date  . Arthritis   . GERD (gastroesophageal reflux disease)   . HLD (hyperlipidemia)   . Stroke [facility])    tia    Past Surgical History:  Procedure Laterality Date  . TONSILLECTOMY      No family history on file.  Social History:  reports that he has never smoked. He has never used smokeless tobacco. He reports that he does not drink alcohol or use drugs.  Allergies:  Allergies  Allergen Reactions  . Rocephin [Ceftriaxone Sodium In Dextrose] Other (See Comments)    C-Diff    Prior to Admission medications   Medication Sig Start Date End Date Taking? Authorizing Provider  acetaminophen (TYLENOL) 500 MG tablet Take 1,000 mg by mouth every 6 (six) hours as needed for fever.    Yes Historical Provider, MD  aspirin 325 MG tablet Take 325 mg by mouth daily.   Yes Historical Provider, MD  azithromycin (ZITHROMAX) 250 MG tablet Take 250 mg by mouth daily. Started 12/24, for 5 days ending 12/28   Yes Historical Provider, MD  Cholecalciferol 1000 units capsule Take 1,000 Units by mouth daily.   Yes  Historical Provider, MD  clopidogrel (PLAVIX) 75 MG tablet Take 75 mg by mouth daily.  05/22/15  Yes Historical Provider, MD  Cyanocobalamin (B-12) 2500 MCG TABS Take 2,500 mcg by mouth daily.    Yes Historical Provider, MD  ferrous sulfate 325 (65 FE) MG EC tablet Take 325 mg by mouth daily with breakfast.   Yes Historical Provider, MD  HYDROMET 5-1.5 MG/5ML syrup Take 5 mLs by mouth 4 (four) times daily as needed for cough. 08/01/16  Yes Historical Provider, MD  ibuprofen (ADVIL,MOTRIN) 200 MG tablet Take 600 mg by mouth every 6 (six) hours as needed for fever.   Yes Historical Provider, MD  LYCOPENE PO Take 1 tablet by mouth daily.   Yes Historical Provider, MD  ranitidine (ZANTAC) 150 MG tablet Take 150 mg by mouth 2 (two) times daily.  05/22/15  Yes Historical Provider, MD  Saw Palmetto 450 MG CAPS Take 450 capsules by mouth daily.   Yes Historical Provider, MD  ZETIA 10 MG tablet Take 10 mg by mouth daily.  05/22/15  Yes Historical Provider, MD    Medications: Prior to Admission:  (Not in a hospital admission)  Results for orders placed or performed during the hospital encounter of 08/02/16 (from the past 48 hour(s))  Protime-INR     Status: None   Collection Time: 08/02/16  7:33 PM  Result Value Ref Range   Prothrombin Time 13.9 11.4 - 15.2  seconds   INR 1.07   APTT     Status: Abnormal   Collection Time: 08/02/16  7:33 PM  Result Value Ref Range   aPTT 37 (H) 24 - 36 seconds    Comment:        IF BASELINE aPTT IS ELEVATED, SUGGEST PATIENT RISK ASSESSMENT BE USED TO DETERMINE APPROPRIATE ANTICOAGULANT THERAPY.   CBC     Status: Abnormal   Collection Time: 08/02/16  7:33 PM  Result Value Ref Range   WBC 13.0 (H) 4.0 - 10.5 K/uL   RBC 3.98 (L) 4.22 - 5.81 MIL/uL   Hemoglobin 12.0 (L) 13.0 - 17.0 g/dL   HCT 36.0 (L) 39.0 - 52.0 %   MCV 90.5 78.0 - 100.0 fL   MCH 30.2 26.0 - 34.0 pg   MCHC 33.3 30.0 - 36.0 g/dL   RDW 13.6 11.5 - 15.5 %   Platelets 167 150 - 400 K/uL   Differential     Status: Abnormal   Collection Time: 08/02/16  7:33 PM  Result Value Ref Range   Neutrophils Relative % 74 %   Neutro Abs 9.5 (H) 1.7 - 7.7 K/uL   Lymphocytes Relative 18 %   Lymphs Abs 2.4 0.7 - 4.0 K/uL   Monocytes Relative 8 %   Monocytes Absolute 1.0 0.1 - 1.0 K/uL   Eosinophils Relative 0 %   Eosinophils Absolute 0.0 0.0 - 0.7 K/uL   Basophils Relative 0 %   Basophils Absolute 0.0 0.0 - 0.1 K/uL  Comprehensive metabolic panel     Status: Abnormal   Collection Time: 08/02/16  7:33 PM  Result Value Ref Range   Sodium 137 135 - 145 mmol/L   Potassium 3.5 3.5 - 5.1 mmol/L   Chloride 103 101 - 111 mmol/L   CO2 28 22 - 32 mmol/L   Glucose, Bld 113 (H) 65 - 99 mg/dL   BUN 15 6 - 20 mg/dL   Creatinine, Ser 1.22 0.61 - 1.24 mg/dL   Calcium 8.8 (L) 8.9 - 10.3 mg/dL   Total Protein 6.7 6.5 - 8.1 g/dL   Albumin 3.7 3.5 - 5.0 g/dL   AST 24 15 - 41 U/L   ALT 15 (L) 17 - 63 U/L   Alkaline Phosphatase 113 38 - 126 U/L   Total Bilirubin 0.6 0.3 - 1.2 mg/dL   GFR calc non Af Amer 52 (L) >60 mL/min   GFR calc Af Amer >60 >60 mL/min    Comment: (NOTE) The eGFR has been calculated using the CKD EPI equation. This calculation has not been validated in all clinical situations. eGFR's persistently <60 mL/min signify possible Chronic Kidney Disease.    Anion gap 6 5 - 15  I-stat troponin, ED     Status: None   Collection Time: 08/02/16  8:12 PM  Result Value Ref Range   Troponin i, poc 0.01 0.00 - 0.08 ng/mL   Comment 3            Comment: Due to the release kinetics of cTnI, a negative result within the first hours of the onset of symptoms does not rule out myocardial infarction with certainty. If myocardial infarction is still suspected, repeat the test at appropriate intervals.   I-Stat Chem 8, ED     Status: Abnormal   Collection Time: 08/02/16  8:15 PM  Result Value Ref Range   Sodium 140 135 - 145 mmol/L   Potassium 4.0 3.5 - 5.1 mmol/L   Chloride 102 101 -  111 mmol/L   BUN 19 6 - 20 mg/dL   Creatinine, Ser 1.10 0.61 - 1.24 mg/dL   Glucose, Bld 110 (H) 65 - 99 mg/dL   Calcium, Ion 1.16 1.15 - 1.40 mmol/L   TCO2 27 0 - 100 mmol/L   Hemoglobin 11.2 (L) 13.0 - 17.0 g/dL   HCT 33.0 (L) 39.0 - 52.0 %  I-Stat CG4 Lactic Acid, ED     Status: None   Collection Time: 08/02/16  8:15 PM  Result Value Ref Range   Lactic Acid, Venous 0.88 0.5 - 1.9 mmol/L  Urinalysis, Routine w reflex microscopic     Status: Abnormal   Collection Time: 08/02/16  8:39 PM  Result Value Ref Range   Color, Urine STRAW (A) YELLOW   APPearance CLEAR CLEAR   Specific Gravity, Urine 1.006 1.005 - 1.030   pH 5.0 5.0 - 8.0   Glucose, UA NEGATIVE NEGATIVE mg/dL   Hgb urine dipstick NEGATIVE NEGATIVE   Bilirubin Urine NEGATIVE NEGATIVE   Ketones, ur NEGATIVE NEGATIVE mg/dL   Protein, ur NEGATIVE NEGATIVE mg/dL   Nitrite NEGATIVE NEGATIVE   Leukocytes, UA NEGATIVE NEGATIVE    Dg Chest 2 View  Result Date: 08/02/2016 CLINICAL DATA:  Cough x3 weeks EXAM: CHEST  2 VIEW COMPARISON:  08/30/2012 FINDINGS: Stable large hiatal hernia. Top normal size cardiac silhouette. Mild uncoiling of the thoracic aorta. No pneumonic consolidation, CHF nor effusion. Mild diffuse interstitial prominence may reflect bronchitic change. Osteoarthritic change is noted about both glenohumeral joints with periarticular soft tissue calcifications seen bilaterally. Multilevel degenerative disc disease of the visualized thoracic and lumbar spine consistent spondylosis. IMPRESSION: Stable large hiatal hernia. Mild diffuse interstitial prominence may reflect bronchitic change. Electronically Signed   By: Ashley Royalty M.D.   On: 08/02/2016 21:33   Ct Head Wo Contrast  Result Date: 08/02/2016 CLINICAL DATA:  Aphasia EXAM: CT HEAD WITHOUT CONTRAST TECHNIQUE: Contiguous axial images were obtained from the base of the skull through the vertex without intravenous contrast. COMPARISON:  Brain MRI 06/22/2016 FINDINGS:  Brain: No mass lesion, intraparenchymal hemorrhage or extra-axial collection. No evidence of acute cortical infarct. There is periventricular hypoattenuation compatible with chronic microvascular disease. Vascular: No hyperdense vessel or unexpected calcification. Skull: Normal visualized skull base, calvarium and extracranial soft tissues. Sinuses/Orbits: No sinus fluid levels or advanced mucosal thickening. No mastoid effusion. Normal orbits. IMPRESSION: Chronic microvascular ischemia without acute intracranial abnormality. Electronically Signed   By: Ulyses Jarred M.D.   On: 08/02/2016 22:24    ROS Blood pressure 136/82, pulse 71, temperature 98.5 F (36.9 C), temperature source Oral, resp. rate 15, height _0  (1.676 m), weight 70.3 kg (155 lb), SpO2 97 %. Neurologic Examination: Awake, alert, fully oriented. Language- fluent.  Comprehension, naming, repetition- normal. Face symmetric. Tongue midline. Strength- 5/5 bilateral Coord- intact No babinski. No hoffman's. Sensation-intact.     Assessment/Plan:  Events are suggestive of complex partial seizures and not TIAs.  Recent MRI did not show any specific lesion to cause seizures, although cortical atrophy is a risk factor.  No need to repeat MRI since so recent.    Recommend routine EEG.  If normal, may consider more prolonged inpatient EEG and/or outpatient 72 hour ambulatory EEG.  I would seriously consider initiating an AED since he has had 3 events already even if his EEG work up is negative.  We will re-visit that later in the hospitalization.     Rogue Jury, MD 08/02/2016, 10:36 PM

## 2016-08-03 ENCOUNTER — Observation Stay (HOSPITAL_BASED_OUTPATIENT_CLINIC_OR_DEPARTMENT_OTHER)
Admit: 2016-08-03 | Discharge: 2016-08-03 | Disposition: A | Discharge status: Home / Self Care | Payer: Medicare Other | Attending: Neurology | Admitting: Neurology

## 2016-08-03 DIAGNOSIS — G9340 Encephalopathy, unspecified: Secondary | ICD-10-CM

## 2016-08-03 DIAGNOSIS — F05 Delirium due to known physiological condition: Secondary | ICD-10-CM

## 2016-08-03 LAB — LIPID PANEL
Cholesterol: 120 mg/dL (ref 0–200)
HDL: 37 mg/dL — ABNORMAL LOW (ref >40)
LDL CALC: 69 mg/dL (ref 0–99)
Total CHOL/HDL Ratio: 3.2 RATIO
Triglycerides: 71 mg/dL (ref <150)
VLDL: 14 mg/dL (ref 0–40)

## 2016-08-03 LAB — EXPECTORATED SPUTUM ASSESSMENT W REFEX TO RESP CULTURE

## 2016-08-03 LAB — EXPECTORATED SPUTUM ASSESSMENT W GRAM STAIN, RFLX TO RESP C

## 2016-08-03 LAB — HEMOGLOBIN A1C
HEMOGLOBIN A1C: 5.6 % (ref 4.8–5.6)
Mean Plasma Glucose: 114 mg/dL

## 2016-08-03 LAB — STREP PNEUMONIAE URINARY ANTIGEN: Strep Pneumo Urinary Antigen: NEGATIVE

## 2016-08-03 MED ORDER — SODIUM CHLORIDE 0.9 % IV SOLN
1000.0000 mg | Freq: Once | INTRAVENOUS | Status: AC
  Administered 2016-08-03: 1000 mg via INTRAVENOUS
  Filled 2016-08-03: qty 10

## 2016-08-03 MED ORDER — OXCARBAZEPINE 300 MG PO TABS: 300.0000 mg | ORAL_TABLET | Freq: Two times a day (BID) | ORAL | 0 refills | Status: DC

## 2016-08-03 NOTE — Discharge Summary (Signed)
Physician Discharge Summary  [name] ZOX:[id number] DOB: June 05, 1929 DOA: 08/02/2016  PCP: Sissy Hoff, MD  Admit date: 08/02/2016 Discharge date: 08/03/2016  Admitted From: Home Disposition:  Home   Recommendations for Outpatient Follow-up:  1. Follow up with PCP in 1-2 weeks 2. Follow up with [facility] in 1 week, may need ambulatory EEG as outpatient  3. Start oxcarbazepine twice daily for seizure prophylaxis  4. Advised not to drive until seizure free for at least 6 months   Home Health: No Equipment/Devices: None   Discharge Condition: Stable CODE STATUS: Full  Diet recommendation: Heart healthy   Brief/Interim Summary: Nicholas Thompsonis a 82 y.o.malewith medical history significant of TIA, stroke, hyperlipidemia, GERD, arthritis, who presents with cough, altered mental status and difficulty speaking. He complained of productive cough and was diagnosed with bronchitis, given Azithromycin. He also had 2 episodes of confusion, disorientation and staring, which was associated with difficulty speaking. Episode lasted for about 30-60 minutes, then resolved spontaneously. Neurology was consulted for his encephalopathy and difficulty speaking. This is likely suggestive of complex partial seizure and not TIA. EEG was completed which was normal. Neurology recommended outpatient ambulatory EEG as well as starting oxcarbazepine twice daily.   Discharge Diagnoses:  Principal Problem:   Acute encephalopathy Active Problems:   Stroke (HCC)   Difficulty speaking   Bronchitis   HLD (hyperlipidemia)   GERD (gastroesophageal reflux disease)   Transient cerebral ischemia   Acute encephalopathy and difficulty speaking -CT head was negative for acute intracranial abnormalities. Neurology was consulted. Per Dr. Nicholas Lose, pt'ssymptomsare suggestive of complex partial seizures and not TIAs. No need to repeat MRI since recent MRI did not show any specific lesion.   -EEG normal. Follow up with neurology for outpatient ambulatory EEG  -Given 1g Keppra, start oxcarbazepine BID  -Seizure precaution  Hx of stroke and TIA -Continue aspirin, plavix,Zetia  Acute bronchitis -Continue azithromycin -Mucinex for cough  -Flu negative   HLD -Continue Zetia  GERD -Pepcid  Discharge Instructions  Discharge Instructions    Diet - low sodium heart healthy    Complete by:  As directed    Driving Restrictions    Complete by:  As directed    You must be seizure-free for 6 months before driving   Increase activity slowly    Complete by:  As directed      Allergies as of 08/03/2016      Reactions   Rocephin [ceftriaxone Sodium In Dextrose] Other (See Comments)   C-Diff      Medication List    TAKE these medications   acetaminophen 500 MG tablet Commonly known as:  TYLENOL Take 1,000 mg by mouth every 6 (six) hours as needed for fever.   aspirin 325 MG tablet Take 325 mg by mouth daily.   azithromycin 250 MG tablet Commonly known as:  ZITHROMAX Take 250 mg by mouth daily. Started 12/24, for 5 days ending 12/28   B-12 2500 MCG Tabs Take 2,500 mcg by mouth daily.   Cholecalciferol 1000 units capsule Take 1,000 Units by mouth daily.   clopidogrel 75 MG tablet Commonly known as:  PLAVIX Take 75 mg by mouth daily.   ferrous sulfate 325 (65 FE) MG EC tablet Take 325 mg by mouth daily with breakfast.   HYDROMET 5-1.5 MG/5ML syrup Generic drug:  HYDROcodone-homatropine Take 5 mLs by mouth 4 (four) times daily as needed for cough.   ibuprofen 200 MG tablet Commonly known as:  ADVIL,MOTRIN Take 600 mg by mouth  every 6 (six) hours as needed for fever.   LYCOPENE PO Take 1 tablet by mouth daily.   Oxcarbazepine 300 MG tablet Commonly known as:  TRILEPTAL Take 1 tablet (300 mg total) by mouth 2 (two) times daily.   ranitidine 150 MG tablet Commonly known as:  ZANTAC Take 150 mg by mouth 2 (two) times daily.   Saw Palmetto 450  MG Caps Take 450 capsules by mouth daily.   ZETIA 10 MG tablet Generic drug:  ezetimibe Take 10 mg by mouth daily.      Follow-up Information    Sissy Hoff, MD Follow up.   Specialty:  Family Medicine Contact information: [id number] [place] [address] [phone]        [facility]. Schedule an appointment as soon as possible for a visit in 1 week(s).   Contact information: [address]     [address] [place] [address] [phone]         Allergies  Allergen Reactions  . Rocephin [Ceftriaxone Sodium In Dextrose] Other (See Comments)    C-Diff    Consultations:  Neurology    Procedures/Studies: Dg Chest 2 View  Result Date: 08/02/2016 CLINICAL DATA:  Cough x3 weeks EXAM: CHEST  2 VIEW COMPARISON:  08/30/2012 FINDINGS: Stable large hiatal hernia. Top normal size cardiac silhouette. Mild uncoiling of the thoracic aorta. No pneumonic consolidation, CHF nor effusion. Mild diffuse interstitial prominence may reflect bronchitic change. Osteoarthritic change is noted about both glenohumeral joints with periarticular soft tissue calcifications seen bilaterally. Multilevel degenerative disc disease of the visualized thoracic and lumbar spine consistent spondylosis. IMPRESSION: Stable large hiatal hernia. Mild diffuse interstitial prominence may reflect bronchitic change. Electronically Signed   By: Tollie Eth M.D.   On: 08/02/2016 21:33   Ct Head Wo Contrast  Result Date: 08/02/2016 CLINICAL DATA:  Aphasia EXAM: CT HEAD WITHOUT CONTRAST TECHNIQUE: Contiguous axial images were obtained from the base of the skull through the vertex without intravenous contrast. COMPARISON:  Brain MRI 06/22/2016 FINDINGS: Brain: No mass lesion, intraparenchymal hemorrhage or extra-axial collection. No evidence of acute cortical infarct. There is periventricular hypoattenuation compatible with chronic microvascular disease.  Vascular: No hyperdense vessel or unexpected calcification. Skull: Normal visualized skull base, calvarium and extracranial soft tissues. Sinuses/Orbits: No sinus fluid levels or advanced mucosal thickening. No mastoid effusion. Normal orbits. IMPRESSION: Chronic microvascular ischemia without acute intracranial abnormality. Electronically Signed   By: Deatra Robinson M.D.   On: 08/02/2016 22:24   Mr Cardiac Morphology W Wo Contrast  Result Date: 07/28/2016 CLINICAL DATA:  Possible LA mass seen on echo EXAM: CARDIAC MRI TECHNIQUE: The patient was scanned on a 1.5 Tesla GE magnet. A dedicated cardiac coil was used. Functional imaging was done using Fiesta sequences. 2,3, and 4 chamber views were done to assess for RWMA's. Modified Simpson's rule using a short axis stack was used to calculate an ejection fraction on a dedicated work [employer]. The patient received 24 cc of Multihance. After 10 minutes inversion recovery sequences were used to assess for infiltration and scar tissue. CONTRAST:  24 cc Multihance FINDINGS: There was moderate biatrial enlargement. There was no ASD/VSD. There was no pericardial effusion. The LAA had no thrombus. There was no LA mass seen There was a large hiatal Hernia compressing the posterior and lateral aspects of the LA. There was no evidence of myxoma. The LV was normal in size and function EF 65%. The RV was normal in  size and function There was no significant MR. The aortic valve had partial fusion of the right and left cusps with likely mild aortic stenosis vs sclerosis. IMPRESSION: 1) No LA mass appreciated 2) Large hiatal hernia compressing the posterior and lateral aspects of the LA 3) Normal RV and LV function latter EF 65% 4) Restricted aortic leaflet motion planimeter valve area 2.0 cm2 likely aortic sclerosis accounting for murmur 5) No cardiac source of embolus appreciated Given patients age and biatrial enlargement and history of CVA patient may  benefit from ILR (implantable loop recorder) to r/o PAF Charlton HawsPeter Nishan Electronically Signed   By: Charlton HawsPeter  Nishan M.D.   On: 07/28/2016 17:33    EEG Impression: This awake and drowsy EEG is normal.    Clinical Correlation: A normal EEG does not exclude a clinical diagnosis of epilepsy.  If further clinical questions remain, prolonged EEG may be helpful.  Clinical correlation is advised.   Discharge Exam: Vitals:   08/03/16 0913 08/03/16 1412  BP: (!) 147/78 118/71  Pulse: 72 71  Resp: 20 20  Temp: 98.4 F (36.9 C) 98.4 F (36.9 C)   Vitals:   08/03/16 0411 08/03/16 0708 08/03/16 0913 08/03/16 1412  BP: (!) 141/73 (!) 158/82 (!) 147/78 118/71  Pulse: 65 70 72 71  Resp: 20 20 20 20   Temp: 98.6 F (37 C) 98.4 F (36.9 C) 98.4 F (36.9 C) 98.4 F (36.9 C)  TempSrc: Oral Oral Oral Oral  SpO2: 94% 99% 98% 99%  Weight:      Height:        General: Pt is alert, awake, not in acute distress Cardiovascular: RRR, S1/S2 +, no rubs, no gallops Respiratory: CTA bilaterally, no wheezing, no rhonchi Abdominal: Soft, NT, ND, bowel sounds + Extremities: no edema, no cyanosis Neuro: nonfocal     The results of significant diagnostics from this hospitalization (including imaging, microbiology, ancillary and laboratory) are listed below for reference.     Microbiology: Recent Results (from the past 240 hour(s))  Culture, expectorated sputum-assessment     Status: None   Collection Time: 08/03/16  5:07 AM  Result Value Ref Range Status   Specimen Description EXPECTORATED SPUTUM  Final   Special Requests NONE  Final   Sputum evaluation THIS SPECIMEN IS ACCEPTABLE FOR SPUTUM CULTURE  Final   Report Status 08/03/2016 FINAL  Final  Culture, respiratory (NON-Expectorated)     Status: None (Preliminary result)   Collection Time: 08/03/16  5:07 AM  Result Value Ref Range Status   Specimen Description EXPECTORATED SPUTUM  Final   Special Requests NONE Reflexed from [id number]  Final   Gram  Stain   Final    ABUNDANT WBC PRESENT, PREDOMINANTLY PMN FEW SQUAMOUS EPITHELIAL CELLS PRESENT MODERATE GRAM POSITIVE COCCI IN PAIRS AND CHAINS IN CLUSTERS RARE GRAM POSITIVE RODS    Culture PENDING  Incomplete   Report Status PENDING  Incomplete     Labs: BNP (last 3 results) No results for input(s): BNP in the last 8760 hours. Basic Metabolic Panel:  Recent Labs Lab 07/28/16 1510 08/02/16 1933 08/02/16 2015  NA  --  137 140  K  --  3.5 4.0  CL  --  103 102  CO2  --  28  --   GLUCOSE  --  113* 110*  BUN  --  15 19  CREATININE 1.25* 1.22 1.10  CALCIUM  --  8.8*  --    Liver Function Tests:  Recent Labs Lab 08/02/16 1933  AST 24  ALT 15*  ALKPHOS 113  BILITOT 0.6  PROT 6.7  ALBUMIN 3.7   No results for input(s): LIPASE, AMYLASE in the last 168 hours. No results for input(s): AMMONIA in the last 168 hours. CBC:  Recent Labs Lab 08/02/16 1933 08/02/16 2015  WBC 13.0*  --   NEUTROABS 9.5*  --   HGB 12.0* 11.2*  HCT 36.0* 33.0*  MCV 90.5  --   PLT 167  --    Cardiac Enzymes: No results for input(s): CKTOTAL, CKMB, CKMBINDEX, TROPONINI in the last 168 hours. BNP: Invalid input(s): POCBNP CBG:  Recent Labs Lab 08/02/16 2023  GLUCAP 110*   D-Dimer No results for input(s): DDIMER in the last 72 hours. Hgb A1c No results for input(s): HGBA1C in the last 72 hours. Lipid Profile  Recent Labs  08/03/16 0529  CHOL 120  HDL 37*  LDLCALC 69  TRIG 71  CHOLHDL 3.2   Thyroid function studies No results for input(s): TSH, T4TOTAL, T3FREE, THYROIDAB in the last 72 hours.  Invalid input(s): FREET3 Anemia work up No results for input(s): VITAMINB12, FOLATE, FERRITIN, TIBC, IRON, RETICCTPCT in the last 72 hours. Urinalysis    Component Value Date/Time   COLORURINE STRAW (A) 08/02/2016 2039   APPEARANCEUR CLEAR 08/02/2016 2039   LABSPEC 1.006 08/02/2016 2039   PHURINE 5.0 08/02/2016 2039   GLUCOSEU NEGATIVE 08/02/2016 2039   HGBUR NEGATIVE  08/02/2016 2039   BILIRUBINUR NEGATIVE 08/02/2016 2039   KETONESUR NEGATIVE 08/02/2016 2039   PROTEINUR NEGATIVE 08/02/2016 2039   NITRITE NEGATIVE 08/02/2016 2039   LEUKOCYTESUR NEGATIVE 08/02/2016 2039   Sepsis Labs Invalid input(s): PROCALCITONIN,  WBC,  LACTICIDVEN Microbiology Recent Results (from the past 240 hour(s))  Culture, expectorated sputum-assessment     Status: None   Collection Time: 08/03/16  5:07 AM  Result Value Ref Range Status   Specimen Description EXPECTORATED SPUTUM  Final   Special Requests NONE  Final   Sputum evaluation THIS SPECIMEN IS ACCEPTABLE FOR SPUTUM CULTURE  Final   Report Status 08/03/2016 FINAL  Final  Culture, respiratory (NON-Expectorated)     Status: None (Preliminary result)   Collection Time: 08/03/16  5:07 AM  Result Value Ref Range Status   Specimen Description EXPECTORATED SPUTUM  Final   Special Requests NONE Reflexed from [id number]  Final   Gram Stain   Final    ABUNDANT WBC PRESENT, PREDOMINANTLY PMN FEW SQUAMOUS EPITHELIAL CELLS PRESENT MODERATE GRAM POSITIVE COCCI IN PAIRS AND CHAINS IN CLUSTERS RARE GRAM POSITIVE RODS    Culture PENDING  Incomplete   Report Status PENDING  Incomplete     Time coordinating discharge: Over 30 minutes  SIGNED:  Noralee StainJennifer Rielly Corlett, DO [facility] Pager [phone]  If 7PM-7AM, please contact night-coverage [email or web address] Password [email or web address] 08/03/2016, 3:00 PM

## 2016-08-03 NOTE — Discharge Instructions (Signed)
Transient Ischemic Attack °A transient ischemic attack (TIA) is a "warning stroke" that causes stroke-like symptoms. A TIA does not cause lasting damage to the brain. The symptoms of a TIA can happen fast and do not last long. It is important to know the symptoms of a TIA and what to do. This can help prevent stroke or death. °Follow these instructions at home: °· Take medicines only as told by your doctor. Make sure you understand all of the instructions. °· You may need to take aspirin or warfarin medicine. Warfarin needs to be taken exactly as told. °¨ Taking too much or too little warfarin is dangerous. Blood tests must be done as often as told by your doctor. A PT blood test measures how long it takes for blood to clot. Your PT is used to calculate another value called an INR. Your PT and INR help your doctor adjust your warfarin dosage. He or she will make sure you are taking the right amount. °¨ Food can cause problems with warfarin and affect the results of your blood tests. This is true for foods high in vitamin K. Eat the same amount of foods high in vitamin K each day. Foods high in vitamin K include spinach, kale, broccoli, cabbage, collard and turnip greens, Brussels sprouts, peas, cauliflower, seaweed, and parsley. Other foods high in vitamin K include beef and pork liver, green tea, and soybean oil. Eat the same amount of foods high in vitamin K each day. Avoid big changes in your diet. Tell your doctor before changing your diet. Talk to a food specialist (dietitian) if you have questions. °¨ Many medicines can cause problems with warfarin and affect your PT and INR. Tell your doctor about all medicines you take. This includes vitamins and dietary pills (supplements). Do not take or stop taking any prescribed or over-the-counter medicines unless your doctor tells you to. °¨ Warfarin can cause more bruising or bleeding. Hold pressure over any cuts for longer than normal. Talk to your doctor about other  side effects of warfarin. °¨ Avoid sports or activities that may cause injury or bleeding. °¨ Be careful when you shave, floss, or use sharp objects. °¨ Avoid or drink very little alcohol while taking warfarin. Tell your doctor if you change how much alcohol you drink. °¨ Tell your dentist and other doctors that you take warfarin before any procedures. °· Follow your diet program as told, if you are given one. °· Keep a healthy weight. °· Stay active. Try to get at least 30 minutes of activity on all or most days. °· Do not use any tobacco products, including cigarettes, chewing tobacco, or electronic cigarettes. If you need help quitting, ask your doctor. °· Limit alcohol intake to no more than 1 drink per day for nonpregnant women and 2 drinks per day for men. One drink equals 12 ounces of beer, 5 ounces of wine, or 1½ ounces of hard liquor. °· Do not abuse drugs. °· Keep your home safe so you do not fall. You can do this by: °¨ Putting grab bars in the bedroom and bathroom. °¨ Raising toilet seats. °¨ Putting a seat in the shower. °· Keep all follow-up visits as told by your doctor. This is important. °Contact a doctor if: °· Your personality changes. °· You have trouble swallowing. °· You have double vision. °· You are dizzy. °· You have a fever. °Get help right away if: °These symptoms may be an emergency. Do not wait to see if   the symptoms will go away. Get medical help right away. Call your local emergency services (911 in the [place] Do not drive yourself to the hospital.  You have sudden weakness or lose feeling (go numb), especially on one side of the body. This can affect your:  Face.  Arm.  Leg.  You have sudden trouble walking.  You have sudden trouble moving your arms or legs.  You have sudden confusion.  You have trouble talking.  You have trouble understanding.  You have sudden trouble seeing in one or both eyes.  You lose your balance.  Your movements are not smooth.  You  have a sudden, very bad headache with no known cause.  You have new chest pain.  Your heartbeat is unsteady.  You are partly or totally unaware of what is going on around you. This information is not intended to replace advice given to you by your health care provider. Make sure you discuss any questions you have with your health care provider. Document Released: 05/04/2008 Document Revised: 03/29/2016 Document Reviewed: 10/31/2013 Elsevier Interactive Patient Education  2017 [employer]  Epilepsy Epilepsy is a condition in which a person has repeated seizures over time. A seizure is a sudden burst of abnormal electrical and chemical activity in the brain. Seizures can cause a change in attention, behavior, or the ability to remain awake and alert (altered mental status). Epilepsy increases a person's risk of falls, accidents, and injury. It can also lead to complications, including: Depression. Poor memory. Sudden unexplained death in epilepsy (SUDEP). This complication is rare, and its cause is not known. Most people with epilepsy lead normal lives. What are the causes? This condition may be caused by: A head injury. An injury that happens at birth. A high fever during childhood. A stroke. Bleeding that goes into or around the brain. Certain medicines and drugs. Having too little oxygen for a long period of time. Abnormal brain development. Certain infections, such as meningitis and encephalitis. Brain tumors. Conditions that are passed along from parent to child (are hereditary). What are the signs or symptoms? Symptoms of a seizure vary greatly from person to person. They include: Convulsions. Stiffening of the body. Involuntary movements of the arms or legs. Loss of consciousness. Breathing problems. Falling suddenly. Confusion. Head nodding. Eye blinking or fluttering. Lip smacking. Drooling. Rapid eye movements. Grunting. Loss of bladder control and bowel  control. Staring. Unresponsiveness. Some people have symptoms right before a seizure happens (aura) and right after a seizure happens. Symptoms of an aura include: Fear or anxiety. Nausea. Feeling like the room is spinning (vertigo). A feeling of having seen or heard something before (deja vu). Odd tastes or smells. Changes in vision, such as seeing flashing lights or spots. Symptoms that follow a seizure include: Confusion. Sleepiness. Headache. How is this diagnosed? This condition is diagnosed based on: Your symptoms. Your medical history. A physical exam. A neurological exam. A neurological exam is similar to a physical exam. It involves checking your strength, reflexes, coordination, and sensations. Tests, such as: An electroencephalogram (EEG). This is a painless test that creates a diagram of your brain waves. An MRI of the brain. A CT scan of the brain. A lumbar puncture, also called a spinal tap. Blood tests to check for signs of infection or abnormal blood chemistry. How is this treated? There is no cure for this condition, but treatment can help control seizures. Treatment may involve: Taking medicines to control seizures. These include medicines to prevent seizures  and medicines to stop seizures as they occur. Having a device called a vagus nerve stimulator implanted in the chest. The device sends electrical impulses to the vagus nerve and to the brain to prevent seizures. This treatment may be recommended if medicines do not help. Brain surgery. There are several kinds of surgeries that may be done to stop seizures from happening or to reduce how often seizures happen. Having regular blood tests. You may need to have blood tests regularly to check that you are getting the right amount of medicine. Once this condition has been diagnosed, it is important to begin treatment as soon as possible. For some people, epilepsy eventually goes away. Follow these instructions at  home: Medicines Take over-the-counter and prescription medicines only as told by your health care provider. Avoid any substances that may prevent your medicine from working properly, such as alcohol. Activity Get enough rest. Lack of sleep can make seizures more likely to occur. Follow instructions from your health care provider about driving, swimming, and doing any other activities that would be dangerous if you had a seizure. Educating others  Teach friends and family what to do if you have a seizure. They should: Lay you on the ground to prevent a fall. Cushion your head and body. Loosen any tight clothing around your neck. Turn you on your side. If vomiting occurs, this helps keep your airway clear. Stay with you until you recover. Not hold you down. Holding you down will not stop the seizure. Not put anything in your mouth. Know whether or not you need emergency care. General instructions Avoid anything that has ever triggered a seizure for you. Keep a seizure diary. Record what you remember about each seizure, especially anything that might have triggered the seizure. Keep all follow-up visits as told by your health care provider. This is important. Contact a health care provider if: Your seizure pattern changes. You have symptoms of infection or another illness. This might increase your risk of having a seizure. Get help right away if: You have a seizure that does not stop after 5 minutes. You have several seizures in a row without a complete recovery in between seizures. You have a seizure that makes it harder to breathe. You have a seizure that is different from previous seizures. You have a seizure that leaves you unable to speak or use a part of your body. You did not wake up immediately after a seizure. This information is not intended to replace advice given to you by your health care provider. Make sure you discuss any questions you have with your health care  provider. Document Released: 07/26/2005 Document Revised: 02/21/2016 Document Reviewed: 02/03/2016 Elsevier Interactive Patient Education  2017 [employer]

## 2016-08-03 NOTE — Progress Notes (Signed)
Patient given discharge instructions.  Family at bedside.  All questions and concerns addressed.  Patient left unit by wheelchair.

## 2016-08-03 NOTE — Evaluation (Signed)
Speech Language Pathology Evaluation Patient Details Name: Nicholas Herrlichllen Gariepy MRN: [id number] DOB: 04/09/1929 Today's Date: 08/03/2016 Time: [id number] SLP Time Calculation (min) (ACUTE ONLY): 21 min  Problem List:  Patient Active Problem List   Diagnosis Date Noted  . Acute encephalopathy 08/02/2016  . Difficulty speaking 08/02/2016  . Bronchitis 08/02/2016  . HLD (hyperlipidemia) 08/02/2016  . Stroke (HCC)   . GERD (gastroesophageal reflux disease)   . Gastroesophageal reflux disease without esophagitis   . Transient cerebral ischemia    Past Medical History:  Past Medical History:  Diagnosis Date  . Arthritis   . GERD (gastroesophageal reflux disease)   . HLD (hyperlipidemia)   . Stroke [facility](HCC)    tia   Past Surgical History:  Past Surgical History:  Procedure Laterality Date  . TONSILLECTOMY     HPI:  Nicholas Gaines is a 82 y.o. male with medical history significant of TIA, stroke, hyperlipidemia, GERD, arthritis, who presents with cough, altered mental status and difficulty speaking. CT chronic microvascular ischemia without acute intracranial abnormality.   Assessment / Plan / Recommendation Clinical Impression  Pt demonstrated difficulty with 5 word recall on MOCA assessment and mild impairments recalling details of his current medical situation. He lives alone with daughter frequently checking in and daughter stays with pt when he is sick. He is able to manage finances and medicine and with intermittent supervision and support from daughter, feel he is safe to discharge home (no ST rec'd).    SLP Assessment  Patient does not need any further Speech Lanaguage Pathology Services    Follow Up Recommendations  None    Frequency and Duration           SLP Evaluation Cognition  Overall Cognitive Status: Within Functional Limits for tasks assessed (slight memory deficit) Arousal/Alertness: Awake/alert Orientation Level: Oriented X4 Attention: Sustained Sustained  Attention: Appears intact Memory: Impaired (4 word recall) Memory Impairment: Retrieval deficit;Storage deficit;Decreased recall of new information Awareness: Appears intact Problem Solving: Appears intact Safety/Judgment: Appears intact       Comprehension  Auditory Comprehension Overall Auditory Comprehension: Appears within functional limits for tasks assessed Visual Recognition/Discrimination Discrimination: Not tested Reading Comprehension Reading Status: Not tested    Expression Expression Primary Mode of Expression: Verbal Verbal Expression Overall Verbal Expression: Appears within functional limits for tasks assessed Pragmatics: No impairment Written Expression Dominant Hand: Right Written Expression: Not tested   Oral / Motor  Oral Motor/Sensory Function Overall Oral Motor/Sensory Function: Mild impairment Facial ROM: Reduced right Facial Symmetry: Abnormal symmetry right Motor Speech Overall Motor Speech: Appears within functional limits for tasks assessed Respiration: Within functional limits Phonation: Normal Resonance: Within functional limits Articulation: Within functional limitis Intelligibility: Intelligible Motor Planning: Witnin functional limits   GO          Functional Assessment Tool Used: skilled clinical judgement Functional Limitations: Spoken language expressive Spoken Language Expression Current Status [phone](G9162): 0 percent impaired, limited or restricted Spoken Language Expression Goal Status (G6659(G9163): 0 percent impaired, limited or restricted Spoken Language Expression Discharge Status [phone](G9164): 0 percent impaired, limited or restricted         Royce MacadamiaLitaker, Tonny Isensee Willis 08/03/2016, 3:13 PM  Breck CoonsLisa Willis Suriah Peragine M.Ed [employer]-SLP Pager [phone]

## 2016-08-03 NOTE — Procedures (Signed)
ELECTROENCEPHALOGRAM REPORT  Date of Study: 08/03/2016  Patient's Name: Lucendia Herrlichllen Varnadore MRN: [id number] Date of Birth: 13-Feb-1929  Referring Provider: Lorretta HarpXilin Niu, MD  Clinical History: 82 year old man with episodes of staring and confusion  Medications:  azithromycin (ZITHROMAX) tablet 250 mg   cholecalciferol (VITAMIN D) tablet 1,000 Units  clopidogrel (PLAVIX) tablet 75 mg    ezetimibe (ZETIA) tablet 10 mg   famotidine (PEPCID) tablet 20 mg   ferrous sulfate tablet 325 mg   LORazepam (ATIVAN) injection 1 mg   zolpidem (AMBIEN) tablet 5 mg   acetaminophen (TYLENOL) 500 MG tablet   aspirin 325 MG tablet   azithromycin (ZITHROMAX) 250 MG tablet   Cholecalciferol 1000 units capsule   clopidogrel (PLAVIX) 75 MG tablet   Cyanocobalamin (B-12) 2500 MCG TABS   ferrous sulfate 325 (65 FE) MG EC tablet   HYDROMET 5-1.5 MG/5ML syrup   ibuprofen (ADVIL,MOTRIN) 200 MG tablet   LYCOPENE PO   ranitidine (ZANTAC) 150 MG tablet   Saw Palmetto 450 MG CAPS   ZETIA 10 MG tablet   Technical Summary: A multichannel digital EEG recording measured by the international 10-20 system with electrodes applied with paste and impedances below 5000 ohms performed in our laboratory with EKG monitoring in an awake and drowsy patient.  Hyperventilation and photic stimulation were not performed.  The digital EEG was referentially recorded, reformatted, and digitally filtered in a variety of bipolar and referential montages for optimal display.    Description: The patient is mostly drowsy during the recording.  During maximal wakefulness, there is a symmetric, medium voltage 8 Hz posterior dominant rhythm that attenuates with eye opening.  The record is symmetric.  During drowsiness and sleep, there is an increase in theta slowing of the background.  Stage 2 sleep was not seen.  There were no epileptiform discharges or electrographic seizures seen.    EKG lead was unremarkable.  Impression: This awake and  drowsy EEG is normal.    Clinical Correlation: A normal EEG does not exclude a clinical diagnosis of epilepsy.  If further clinical questions remain, prolonged EEG may be helpful.  Clinical correlation is advised.   Shon MilletAdam Jaffe, DO

## 2016-08-03 NOTE — Care Management Note (Signed)
Case Management Note  Patient Details  Name: Nicholas Gaines MRN: [id number] Date of Birth: 1928-12-05  Subjective/Objective:                    Action/Plan: Pt discharging home with self care. PT recommending outpatient therapy. CM met with the patient and his family and they are refusing outpt services. They have a family member that can provide therapy for the patient at home.   Expected Discharge Date:                  Expected Discharge Plan:  Home/Self Care  In-House Referral:     Discharge planning Services  CM Consult  Post Acute Care Choice:    Choice offered to:     DME Arranged:    DME Agency:     HH Arranged:    [place] Agency:     Status of Service:  Completed, signed off  If discussed at [employer] of Stay Meetings, dates discussed:    Additional Comments:  Pollie Friar, RN 08/03/2016, 3:40 PM

## 2016-08-03 NOTE — Evaluation (Addendum)
Physical Therapy Evaluation Patient Details Name: Nicholas Gaines MRN: [id number] DOB: 01-21-1929 Today's Date: 08/03/2016   History of Present Illness  HPI: Nicholas Gaines is a 82 y.o. male with medical history significant of TIA, stroke, hyperlipidemia, GERD, arthritis, who presents with cough, altered mental status and difficulty speaking.  Clinical Impression   Pt admitted with above. From a functional mobility standpoint, Nicholas Gaines is at or close to baseline; Given decr gait speed, and age, Outpatient PT is a good idea for a more thorough look at gait and balance; Noted he had difficulty recognizing a Nicholas Gaines during session; spoke with RN -- could be worth considering ST consult;  Recommend pt walk the hallways independently while in-hospital, and consider Outpatient PT follow up. Will sign off     Follow Up Recommendations Outpatient PT    Equipment Recommendations  None recommended by PT    Recommendations for Other Services Speech consult     Precautions / Restrictions Precautions Precautions: None      Mobility  Bed Mobility Overal bed mobility: Modified Independent             General bed mobility comments: flat bed, increased time, used rail  Transfers Overall transfer level: Modified independent Equipment used: None             General transfer comment: increased time  Ambulation/Gait Ambulation/Gait assistance: Modified independent (Device/Increase time) Ambulation Distance (Feet): 100 Feet Assistive device: None Gait Pattern/deviations: Step-through pattern;Decreased step length - right;Decreased step length - left;Decreased stride length Gait velocity: Decr   General Gait Details: Noting trunk stiffness with amb, slightly decr arm swing; no gross loss of balance; pt tells me he is close to baseline  Stairs Stairs: Yes Stairs assistance: Supervision Stair Management: One rail Right;Alternating pattern;Forwards Number of Stairs: 5 General  stair comments: noted one slight  loss of balance from which he recovered without physical assist  Wheelchair Mobility    Modified Rankin (Stroke Patients Only) Modified Rankin (Stroke Patients Only) Pre-Morbid Rankin Score: No symptoms Modified Rankin: No significant disability     Balance Overall balance assessment: Needs assistance   Sitting balance-Leahy Scale: Good       Standing balance-Leahy Scale: Fair                               Pertinent Vitals/Pain Pain Assessment: No/denies pain    Home Living Family/patient expects to be discharged to:: Private residence Living Arrangements: Alone Available Help at Discharge: Family;Available PRN/intermittently Type of Home: House Home Access: Stairs to enter Entrance Stairs-Rails: Right Entrance Stairs-Number of Steps: 5 Home Layout: One level Home Equipment: None      Prior Function Level of Independence: Independent         Comments: pt drives, does his own housekeeping and meal prep, likes to garden     Hand Dominance   Dominant Hand: Right    Extremity/Trunk Assessment   Upper Extremity Assessment Upper Extremity Assessment: Overall WFL for tasks assessed    Lower Extremity Assessment Lower Extremity Assessment: Overall WFL for tasks assessed    Cervical / Trunk Assessment Cervical / Trunk Assessment:  (Noting stiffness in trunk with gait)  Communication   Communication: No difficulties  Cognition Arousal/Alertness: Awake/alert Behavior During Therapy: WFL for tasks assessed/performed Overall Cognitive Status: Within Functional Limits for tasks assessed  General Comments General comments (skin integrity, edema, etc.): We discussed signs and symptoms of stroke    Exercises     Assessment/Plan    PT Assessment All further PT needs can be met in the next venue of care  PT Problem List Decreased range of motion;Decreased balance;Decreased knowledge of  use of DME;Other (comment) (Decr gait speed)          PT Treatment Interventions      PT Goals (Current goals can be found in the Care Plan section)  Acute Rehab PT Goals Patient Stated Goal: return home PT Goal Formulation: All assessment and education complete, DC therapy    Frequency     Barriers to discharge        Co-evaluation               End of Session   Activity Tolerance: Patient tolerated treatment well Patient left: in chair;with call bell/phone within reach;with family/visitor present Nurse Communication: Mobility status;Other (comment) (request SLP eval)    Functional Assessment Tool Used: Clinical Judgement Functional Limitation: Mobility: Walking and moving around Mobility: Walking and Moving Around Current Status [phone]): At least 1 percent but less than 20 percent impaired, limited or restricted Mobility: Walking and Moving Around Goal Status [phone]): At least 1 percent but less than 20 percent impaired, limited or restricted Mobility: Walking and Moving Around Discharge Status [phone]): At least 1 percent but less than 20 percent impaired, limited or restricted    Time: 1100-1116 PT Time Calculation (min) (ACUTE ONLY): 16 min   Charges:         08/03/16 1100  PT General Charges  $$ ACUTE PT VISIT 1 Procedure  PT Evaluation  $PT Eval Low Complexity 1 Procedure      PT G Codes:   PT G-Codes **NOT FOR INPATIENT CLASS** Functional Assessment Tool Used: Clinical Judgement Functional Limitation: Mobility: Walking and moving around Mobility: Walking and Moving Around Current Status (B1660): At least 1 percent but less than 20 percent impaired, limited or restricted Mobility: Walking and Moving Around Goal Status [phone]): At least 1 percent but less than 20 percent impaired, limited or restricted Mobility: Walking and Moving Around Discharge Status [phone]): At least 1 percent but less than 20 percent impaired, limited or restricted    Colletta Maryland 08/03/2016, 11:38 AM  Roney Marion, [place] Pager [phone] Office [phone]

## 2016-08-03 NOTE — Progress Notes (Signed)
EEG Completed; Results Pending  

## 2016-08-03 NOTE — Evaluation (Signed)
Occupational Therapy Evaluation Patient Details Name: Nicholas Gaines MRN: [id number] DOB: 09/07/1928 Today's Date: 08/03/2016    History of Present Illness HPI: Nicholas Herrlichllen Buckner is a 82 y.o. male with medical history significant of TIA, stroke, hyperlipidemia, GERD, arthritis, who presents with cough, altered mental status and difficulty speaking.   Clinical Impression   Pt is performing ADL and mobility at a modified independent level. No OT needs. Educated pt in fall prevention and home safety.     Follow Up Recommendations  No OT follow up    Equipment Recommendations  None recommended by OT    Recommendations for Other Services       Precautions / Restrictions Precautions Precautions: None      Mobility Bed Mobility Overal bed mobility: Modified Independent             General bed mobility comments: flat bed, increased time, used rail  Transfers Overall transfer level: Modified independent Equipment used: None             General transfer comment: increased time    Balance Overall balance assessment: Needs assistance   Sitting balance-Leahy Scale: Good       Standing balance-Leahy Scale: Fair                              ADL Overall ADL's : Modified independent                                             Vision     Perception     Praxis      Pertinent Vitals/Pain Pain Assessment: No/denies pain     Hand Dominance Right   Extremity/Trunk Assessment Upper Extremity Assessment Upper Extremity Assessment: Overall WFL for tasks assessed   Lower Extremity Assessment Lower Extremity Assessment: Defer to PT evaluation       Communication Communication Communication: No difficulties   Cognition Arousal/Alertness: Awake/alert Behavior During Therapy: WFL for tasks assessed/performed Overall Cognitive Status: Within Functional Limits for tasks assessed                     General Comments        Exercises       Shoulder Instructions      Home Living Family/patient expects to be discharged to:: Private residence Living Arrangements: Alone Available Help at Discharge: Family;Available PRN/intermittently Type of Home: House Home Access: Stairs to enter Enter[employer] CorporationEntrance Stairs-Number of Steps: 5 Entrance Stairs-Rails: Left Home Layout: One level     Bathroom Shower/Tub: Tub/shower unit;Door   Foot LockerBathroom Toilet: Standard     Home Equipment: None          Prior Functioning/Environment Level of Independence: Independent        Comments: pt drives, does his own housekeeping and meal prep, likes to garden        OT Problem List: Decreased activity tolerance   OT Treatment/Interventions:      OT Goals(Current goals can be found in the care plan section) Acute Rehab OT Goals Patient Stated Goal: return home  OT Frequency:     Barriers to D/C:            Co-evaluation              End of Session Equipment Utilized During Treatment: Gait belt Nurse Communication:  Mobility status (did not use chair alarm)  Activity Tolerance: Patient tolerated treatment well Patient left: in chair;with call bell/phone within reach   Time: 1028-1101 OT Time Calculation (min): 33 min Charges:  OT General Charges $OT Visit: 1 Procedure OT Evaluation $OT Eval Low Complexity: 1 Procedure G-Codes: OT G-codes **NOT FOR INPATIENT CLASS** Functional Assessment Tool Used: clinical judgement Functional Limitation: Self care Self Care Current Status (Z6109(G8987): At least 1 percent but less than 20 percent impaired, limited or restricted Self Care Goal Status (U0454(G8988): At least 1 percent but less than 20 percent impaired, limited or restricted Self Care Discharge Status [phone](G8989): At least 1 percent but less than 20 percent impaired, limited or restricted  Evern BioMayberry, Ashantia Amaral Lynn 08/03/2016, 11:08 AM  [phone]

## 2016-08-03 NOTE — Care Management Obs Status (Signed)
MEDICARE OBSERVATION STATUS NOTIFICATION   Patient Details  Name: Nicholas Gaines MRN: [id number] Date of Birth: 12/15/1928   Medicare Observation Status Notification Given:  Yes    Kermit BaloKelli F Carroll Lingelbach, RN 08/03/2016, 3:16 PM

## 2016-08-03 NOTE — Progress Notes (Signed)
PROGRESS NOTE    Nicholas Gaines  ZOX:[id number] DOB: 08/20/1928 DOA: 08/02/2016 PCP: Sissy HoffSWAYNE,DAVID W, MD     Brief Narrative:  Nicholas Gaines is a 82 y.o. male with medical history significant of TIA, stroke, hyperlipidemia, GERD, arthritis, who presents with cough, altered mental status and difficulty speaking. He complained of productive cough and was diagnosed with bronchitis, given Azithromycin. He also had 2 episodes of confusion, disorientation and staring, which was associated with difficulty speaking. Episode lasted for about 30-60 minutes, then resolved spontaneously. Neurology was consulted for his encephalopathy and difficulty speaking. This is likely suggestive of complex partial seizure and not TIA.   Assessment & Plan:   Principal Problem:   Acute encephalopathy Active Problems:   Stroke (HCC)   Difficulty speaking   Bronchitis   HLD (hyperlipidemia)   GERD (gastroesophageal reflux disease)   Transient cerebral ischemia   Acute encephalopathy and difficulty speaking -CT head was negative for acute intracranial abnormalities. Neurology was consulted. Per Dr. Nicholas LoseEshraghi, pt's symptoms are suggestive of complex partial seizures and not TIAs. No need to repeat MRI since recent MRI did not show any specific lesion.  -EEG normal. May consider prolonged inpatient EEG vs outpatient ambulatory EEG  -Given 1g Keppra, neurology to decide on long-term AED needs  -Seizure precaution  Hx of stroke and TIA -Continue aspirin, plavix, Zetia  Acute bronchitis -Continue azithromycin -Mucinex for cough  -Flu negative   HLD -Continue Zetia  GERD -Pepcid   DVT prophylaxis: lovenox Code Status: Full Family Communication: grandson and granddaughter in Social workerlaw at bedside Disposition Plan: home, pending further work up and Warehouse managerplan   Consultants:   Neurology  Procedures:   None  Antimicrobials:   None     Subjective: Patient reports that his symptom of confusion and  speech difficulty, aphasia has now resolved. He has no other complaints this morning and feels about his baseline. No CP SOB NVD   Objective: Vitals:   08/02/16 2345 08/03/16 0207 08/03/16 0411 08/03/16 0708  BP: (!) 164/88 126/63 (!) 141/73 (!) 158/82  Pulse: 77 70 65 70  Resp: 16 20 20 20   Temp: 98.4 F (36.9 C) 98.4 F (36.9 C) 98.6 F (37 C) 98.4 F (36.9 C)  TempSrc: Oral Oral Oral Oral  SpO2: 99% 97% 94% 99%  Weight: 71.2 kg (157 lb)     Height: 5\' 6"  (1.676 m)       Intake/Output Summary (Last 24 hours) at 08/03/16 0741 Last data filed at 08/03/16 0313  Gross per 24 hour  Intake                0 ml  Output              800 ml  Net             -800 ml   Filed Weights   08/02/16 1920 08/02/16 2024 08/02/16 2345  Weight: 70.3 kg (155 lb) 70.3 kg (155 lb) 71.2 kg (157 lb)    Examination:  General exam: Appears calm and comfortable  Respiratory system: Clear to auscultation. Respiratory effort normal. Cardiovascular system: S1 & S2 heard, RRR. No JVD, murmurs, rubs, gallops or clicks. No pedal edema. Gastrointestinal system: Abdomen is nondistended, soft and nontender. No organomegaly or masses felt. Normal bowel sounds heard. Central nervous system: Alert and oriented. No focal neurological deficits. Extremities: Symmetric 5 x 5 power. Skin: No rashes, lesions or ulcers Psychiatry: Judgement and insight appear normal. Mood & affect appropriate.   Data  Reviewed: I have personally reviewed following labs and imaging studies  CBC:  Recent Labs Lab 08/02/16 1933 08/02/16 2015  WBC 13.0*  --   NEUTROABS 9.5*  --   HGB 12.0* 11.2*  HCT 36.0* 33.0*  MCV 90.5  --   PLT 167  --    Basic Metabolic Panel:  Recent Labs Lab 07/28/16 1510 08/02/16 1933 08/02/16 2015  NA  --  137 140  K  --  3.5 4.0  CL  --  103 102  CO2  --  28  --   GLUCOSE  --  113* 110*  BUN  --  15 19  CREATININE 1.25* 1.22 1.10  CALCIUM  --  8.8*  --    GFR: Estimated Creatinine  Clearance: 44.3 mL/min (by C-G formula based on SCr of 1.1 mg/dL). Liver Function Tests:  Recent Labs Lab 08/02/16 1933  AST 24  ALT 15*  ALKPHOS 113  BILITOT 0.6  PROT 6.7  ALBUMIN 3.7   No results for input(s): LIPASE, AMYLASE in the last 168 hours. No results for input(s): AMMONIA in the last 168 hours. Coagulation Profile:  Recent Labs Lab 08/02/16 1933  INR 1.07   Cardiac Enzymes: No results for input(s): CKTOTAL, CKMB, CKMBINDEX, TROPONINI in the last 168 hours. BNP (last 3 results) No results for input(s): PROBNP in the last 8760 hours. HbA1C: No results for input(s): HGBA1C in the last 72 hours. CBG:  Recent Labs Lab 08/02/16 2023  GLUCAP 110*   Lipid Profile:  Recent Labs  08/03/16 0529  CHOL 120  HDL 37*  LDLCALC 69  TRIG 71  CHOLHDL 3.2   Thyroid Function Tests: No results for input(s): TSH, T4TOTAL, FREET4, T3FREE, THYROIDAB in the last 72 hours. Anemia Panel: No results for input(s): VITAMINB12, FOLATE, FERRITIN, TIBC, IRON, RETICCTPCT in the last 72 hours. Sepsis Labs:  Recent Labs Lab 08/02/16 2015 08/02/16 2309  LATICACIDVEN 0.88 1.09    Recent Results (from the past 240 hour(s))  Culture, expectorated sputum-assessment     Status: None   Collection Time: 08/03/16  5:07 AM  Result Value Ref Range Status   Specimen Description EXPECTORATED SPUTUM  Final   Special Requests NONE  Final   Sputum evaluation THIS SPECIMEN IS ACCEPTABLE FOR SPUTUM CULTURE  Final   Report Status 08/03/2016 FINAL  Final       Radiology Studies: Dg Chest 2 View  Result Date: 08/02/2016 CLINICAL DATA:  Cough x3 weeks EXAM: CHEST  2 VIEW COMPARISON:  08/30/2012 FINDINGS: Stable large hiatal hernia. Top normal size cardiac silhouette. Mild uncoiling of the thoracic aorta. No pneumonic consolidation, CHF nor effusion. Mild diffuse interstitial prominence may reflect bronchitic change. Osteoarthritic change is noted about both glenohumeral joints with  periarticular soft tissue calcifications seen bilaterally. Multilevel degenerative disc disease of the visualized thoracic and lumbar spine consistent spondylosis. IMPRESSION: Stable large hiatal hernia. Mild diffuse interstitial prominence may reflect bronchitic change. Electronically Signed   By: Tollie Ethavid  Kwon M.D.   On: 08/02/2016 21:33   Ct Head Wo Contrast  Result Date: 08/02/2016 CLINICAL DATA:  Aphasia EXAM: CT HEAD WITHOUT CONTRAST TECHNIQUE: Contiguous axial images were obtained from the base of the skull through the vertex without intravenous contrast. COMPARISON:  Brain MRI 06/22/2016 FINDINGS: Brain: No mass lesion, intraparenchymal hemorrhage or extra-axial collection. No evidence of acute cortical infarct. There is periventricular hypoattenuation compatible with chronic microvascular disease. Vascular: No hyperdense vessel or unexpected calcification. Skull: Normal visualized skull base, calvarium and extracranial soft  tissues. Sinuses/Orbits: No sinus fluid levels or advanced mucosal thickening. No mastoid effusion. Normal orbits. IMPRESSION: Chronic microvascular ischemia without acute intracranial abnormality. Electronically Signed   By: Deatra Robinson M.D.   On: 08/02/2016 22:24      Scheduled Meds: . aspirin  325 mg Oral Daily  . azithromycin  250 mg Oral Daily  . cholecalciferol  1,000 Units Oral Daily  . clopidogrel  75 mg Oral Daily  . dextromethorphan-guaiFENesin  1 tablet Oral BID  . enoxaparin (LOVENOX) injection  40 mg Subcutaneous Daily  . ezetimibe  10 mg Oral Daily  . famotidine  20 mg Oral BID  . ferrous sulfate  325 mg Oral Q breakfast  . vitamin B-12  2,500 mcg Oral Daily   Continuous Infusions:   LOS: 0 days    Time spent: 40 minutes   Noralee Stain, DO [facility] [email or web address] Password [facility] 08/03/2016, 7:41 AM

## 2016-08-03 NOTE — Progress Notes (Signed)
EEG performed today was normal.   Assessment/Recommendations: 1. A 72-hour ambulatory EEG would be the best next step in evaluation for possible recurrent complex partial seizures.  2. Alternatively, initiating empiric oxcarbazepine may be the best course of action clinically. If ambulatory EEG is negative and episodes continue on oxcarbazepine, then incipient dementia with cognitive fluctuations would be higher on the DDx.  3. Oxcarbazepine can be started here as follows: 300 mg po BID. 4. Ambulatory EEG can be pursued as an outpatient.  5. Follow up with [facility] is recommended.  6. The patient should be advised not to drive until seizure free for at least 6 months.   Electronically signed: Dr. Caryl PinaEric Pepper Kerrick

## 2016-08-04 LAB — URINE CULTURE

## 2016-08-05 LAB — CULTURE, RESPIRATORY W GRAM STAIN: Culture: NORMAL

## 2016-08-05 LAB — CULTURE, RESPIRATORY

## 2016-08-07 LAB — CULTURE, BLOOD (ROUTINE X 2)
Culture: NO GROWTH
Culture: NO GROWTH

## 2016-08-11 DIAGNOSIS — K219 Gastro-esophageal reflux disease without esophagitis: Secondary | ICD-10-CM | POA: Diagnosis not present

## 2016-08-11 DIAGNOSIS — E782 Mixed hyperlipidemia: Secondary | ICD-10-CM | POA: Diagnosis not present

## 2016-08-11 DIAGNOSIS — I358 Other nonrheumatic aortic valve disorders: Secondary | ICD-10-CM | POA: Diagnosis not present

## 2016-08-11 DIAGNOSIS — I6930 Unspecified sequelae of cerebral infarction: Secondary | ICD-10-CM | POA: Diagnosis not present

## 2016-08-11 DIAGNOSIS — J209 Acute bronchitis, unspecified: Secondary | ICD-10-CM | POA: Diagnosis not present

## 2016-08-11 DIAGNOSIS — R569 Unspecified convulsions: Secondary | ICD-10-CM | POA: Diagnosis not present

## 2016-08-13 ENCOUNTER — Ambulatory Visit (INDEPENDENT_AMBULATORY_CARE_PROVIDER_SITE_OTHER): Payer: Medicare Other | Admitting: Neurology

## 2016-08-13 ENCOUNTER — Telehealth: Payer: Self-pay

## 2016-08-13 ENCOUNTER — Encounter: Payer: Self-pay | Admitting: Neurology

## 2016-08-13 VITALS — BP 115/71 | HR 80 | Ht 66.0 in | Wt 164.2 lb

## 2016-08-13 DIAGNOSIS — R404 Transient alteration of awareness: Secondary | ICD-10-CM

## 2016-08-13 DIAGNOSIS — R569 Unspecified convulsions: Secondary | ICD-10-CM

## 2016-08-13 MED ORDER — LEVETIRACETAM 500 MG PO TABS: 500.0000 mg | ORAL_TABLET | Freq: Two times a day (BID) | ORAL | 11 refills | Status: DC

## 2016-08-13 NOTE — Progress Notes (Signed)
[facility]    Provider:  Dr Lucia GaskinsAhern Referring Provider: Tally JoeSwayne, David, MD Primary Care Physician:  Sissy HoffSWAYNE,DAVID W, MD  CC:  TIAs vs seizures  HPI:  Nicholas Gaines is a 83 y.o. male here as a referral from Dr. Azucena CecilSwayne for TIA vs seizures. Past medical history of atrial fibrillation, heart murmur, stroke in 2002, esophageal stricture, hyperlipidemia, TIA, prediabetic. He is on aspirin 325 and Plavix 75. Patient has had several episodes of staring, confusion, disorientation and unintelligible speech. Usually back to baseline within a half hour. No distinct recollection of the events and only slightly before and after them. No weakness or numbness or incoordination or gait disturbances. No headaches. Episodes happened when he was feeling well but also in the setting of fevers and cough and respiratory infection. The first episode was in November, he was fishing and he tried to talk and couldn't get his words out, lasted 15 minutes, he could talk when he got home. Daughter is here and provides much information, she saw him later that evening and it was fine. He denies any other associated symptoms such as weakness or confusion or vision changes. His friend in the car noticed that there was something wrong, no LOC, no urination or abnormal movements, he was in his usual state of health at the time no new medications or anything new but he was starting to feel ill at the time. Next episode was on Christmas eve in the setting of illness, daughter was calling him and he did not answer and he was sitting in the car with his wet clothes on from vomiting and he was asleep in the car, he was weak, he was running a fever of 102 and he was not taken to the hospital. On Christmas day he didn't recognize family, he was speaking in "word salad" and he was aimlessly wandering around the house and looking into space and not saying anything, couldn;t tell his daughter's name. No events since being discharged  from the hospital. No current focal neurologic deficits, other associated symptoms or modifiable factors. He has not had any more incidents since starting AEDs, was started on trileptal in the hospital.   Reviewed notes, labs and imaging from outside physicians, which showed:   LDL 69, BUN 19, creatinine 1.1, A1c 5.6 08/03/2016. TSH 1.9 11/14/2015.  Patient presented to the emergency room on 07/03/2016 with fever, cough and episodic confusion. He said 2 episodes in the last several days where he had become discretely confused. Patient had a TIA in November diagnosed by primary care. He had been having on and off fevers, shortness of breath, cough and was recently started on antibiotics. Patient was unable to speak during his TIA, he remembered trying to speak and not being able to. He had a outpatient workup for TIA with his PCP that showed MRI with old stroke in 2002 and is getting loose recorder for paroxysmal A. fib. Episodes of confusion were described as disorientation and staring with associated difficulty speaking. Episodes lasted 30-60 minutes then resolved spontaneously. Patient did not have any weakness, numbness, vision or hearing loss. Episodes of happened in the setting of temperature and cough but also happen when patient was feeling well. On admission he was found to have elevated white blood cells 13, CT of the head was negative for acute intracranial abnormalities and neurology was consulted. Neurology felt that these were suggestive of complex partial seizures and not TIAs. Routine EEG was recommended for outpatient 72 hour ambulatory EEG.  Personally reviewed images and agree with the following: MRI brain 06/22/2016: 1. No acute intracranial abnormality. 2. Mild chronic microvascular ischemic changes and parenchymal volume loss for age. Few scattered punctate foci of chronic microhemorrhage are nonspecific but also likely related to microvascular ischemic changes.  Review of  Systems: Patient complains of symptoms per HPI as well as the following symptoms: Fatigue, cough, confusion, seizure. Pertinent negatives per HPI. All others negative.   Social History   Social History  . Marital status: Widowed    Spouse name: N/A  . Number of children: 1  . Years of education: 65   Occupational History  . Retired    Social History Main Topics  . Smoking status: Never Smoker  . Smokeless tobacco: Never Used  . Alcohol use No  . Drug use: No  . Sexual activity: Not on file   Other Topics Concern  . Not on file   Social History Narrative   Lives alone   Caffeine use: none    Family History  Problem Relation Age of Onset  . Hypertension Mother   . Parkinson's disease Mother   . Heart attack Mother   . Stroke Sister   . Cancer Sister   . Stroke Brother     Past Medical History:  Diagnosis Date  . A-fib (HCC)   . Arthritis   . GERD (gastroesophageal reflux disease)   . Heart murmur   . HLD (hyperlipidemia)   . Stroke [facility])    tia    Past Surgical History:  Procedure Laterality Date  . CARPAL TUNNEL RELEASE     About 10 yr ago (08/13/16)  . TONSILLECTOMY      Current Outpatient Prescriptions  Medication Sig Dispense Refill  . acetaminophen (TYLENOL) 500 MG tablet Take 1,000 mg by mouth every 6 (six) hours as needed for fever.     Marland Kitchen aspirin 325 MG tablet Take 325 mg by mouth daily.    . Cholecalciferol 1000 units capsule Take 1,000 Units by mouth daily.    . clopidogrel (PLAVIX) 75 MG tablet Take 75 mg by mouth daily.   0  . Cyanocobalamin (B-12) 2500 MCG TABS Take 2,500 mcg by mouth daily.     . ferrous sulfate 325 (65 FE) MG EC tablet Take 325 mg by mouth daily with breakfast.    . HYDROMET 5-1.5 MG/5ML syrup Take 5 mLs by mouth 4 (four) times daily as needed for cough.  0  . ibuprofen (ADVIL,MOTRIN) 200 MG tablet Take 600 mg by mouth every 6 (six) hours as needed for fever.    Marland Kitchen LYCOPENE PO Take 1 tablet by mouth daily.    . ranitidine  (ZANTAC) 150 MG tablet Take 150 mg by mouth 2 (two) times daily.   0  . Saw Palmetto 450 MG CAPS Take 450 capsules by mouth daily.    Marland Kitchen ZETIA 10 MG tablet Take 10 mg by mouth daily.   0  . levETIRAcetam (KEPPRA) 500 MG tablet Take 1 tablet (500 mg total) by mouth 2 (two) times daily. 60 tablet 11   No current facility-administered medications for this visit.     Allergies as of 08/13/2016 - Review Complete 08/13/2016  Allergen Reaction Noted  . Rocephin [ceftriaxone sodium in dextrose] Other (See Comments) 08/02/2016    Vitals: BP 115/71 (BP Location: Right Arm, Patient Position: Sitting, Cuff Size: Normal)   Pulse 80   Ht 5\' 6"  (1.676 m)   Wt 164 lb 3.2 oz (74.5 kg)  BMI 26.50 kg/m  Last Weight:  Wt Readings from Last 1 Encounters:  08/13/16 164 lb 3.2 oz (74.5 kg)   Last Height:   Ht Readings from Last 1 Encounters:  08/13/16 5\' 6"  (1.676 m)   Physical exam: Exam: Gen: NAD, conversant, well nourised, obese, well groomed                     CV: RRR, no MRG. No Carotid Bruits. No peripheral edema, warm, nontender Eyes: Conjunctivae clear without exudates or hemorrhage  Neuro: Detailed Neurologic Exam  Speech:    Speech is normal; fluent and spontaneous with normal comprehension.  Cognition:    The patient is oriented to person, place, and time;     recent memory impaired and remote memory intact;     language fluent;     normal attention, concentration,     fund of knowledge impaired Cranial Nerves:    The pupils are equal, round, and reactive to light. Attempted fundoscopic exam could not visualize. Visual fields are full to finger confrontation. Extraocular movements are intact. Trigeminal sensation is intact and the muscles of mastication are normal. The face is symmetric. The palate elevates in the midline. Hearing intact. Voice is normal. Shoulder shrug is normal. The tongue has normal motion without fasciculations.   Coordination:    Normal finger to nose and  heel to shin.   Gait:    Slight shuffling and stooped posture  Motor Observation:    No asymmetry, no atrophy, and no involuntary movements noted. Tone:    Normal muscle tone.      Strength:    Strength is V/V in the upper and lower limbs.      Sensation: intact to LT     Reflex Exam:  DTR's:    Deep tendon reflexes in the upper and lower extremities are brisk bilaterally.   Toes:    The toes are downgoing bilaterally.   Clonus:    Clonus is absent.       Assessment/Plan:  83 y.o. male here as a referral from Dr. Azucena Cecil for TIA vs seizures. Past medical history of atrial fibrillation, heart murmur, stroke in 2002, esophageal stricture, hyperlipidemia, TIA, prediabetic. He is on aspirin 325 and Plavix 75. Patient has had several episodes of staring, confusion, disorientation and unintelligible speech.   - Was started on Trileptal inpatient due to dx of complex partial seizures. No events since then. Will change AED to Keppra 500mg  bid. - CBC and CMP today - Patient is unable to drive, operate heavy machinery, perform activities at heights or participate in water activities until 6 months seizure free - Discussed seizure precautions - EEG inpatient was normal, will order a 72 hour ambulatory eeg - He is scheduled for loop recorder placement and TEE  Cc:  Sissy Hoff, MD  Naomie Dean, MD  [facility] [address] [place], [place] [address]  Phone [phone] Fax [phone]

## 2016-08-13 NOTE — Telephone Encounter (Signed)
SENT NOTES TO SCHEDULING 

## 2016-08-13 NOTE — Patient Instructions (Addendum)
Remember to drink plenty of fluid, eat healthy meals and do not skip any meals. Try to eat protein with a every meal and eat a healthy snack such as fruit or nuts in between meals. Try to keep a regular sleep-wake schedule and try to exercise daily, particularly in the form of walking, 20-30 minutes a day, if you can.   As far as your medications are concerned, I would like to suggest: Stop Oxcarbazepine start Keppra 500mg  twice daily  As far as diagnostic testing: prolonged EEG for 72 hours, lab  I would like to see you back in 4 months, sooner if we need to. Please call us with any interim questions, concerns, problems, updates or refill requests.   Our phone number is [phone]. We also have an after hours call service for urgent matters and there is a physician on-call for urgent questions. For any emergencies you know to call 911 or go to the nearest emergency room  Levetiracetam tablets What is this medicine? LEVETIRACETAM (lee ve tye RA se tam) is an antiepileptic drug. It is used with other medicines to treat certain types of seizures. This medicine may be used for other purposes; ask your health care provider or pharmacist if you have questions. COMMON BRAND NAME(S): Keppra, Roweepra What should I tell my health care provider before I take this medicine? They need to know if you have any of these conditions: -kidney disease -suicidal thoughts, plans, or attempt; a previous suicide attempt by you or a family member -an unusual or allergic reaction to levetiracetam, other medicines, foods, dyes, or preservatives -pregnant or trying to get pregnant -breast-feeding How should I use this medicine? Take this medicine by mouth with a glass of water. Follow the directions on the prescription label. Swallow the tablets whole. Do not crush or chew this medicine. You may take this medicine with or without food. Take your doses at regular intervals. Do not take your medicine more often than  directed. Do not stop taking this medicine or any of your seizure medicines unless instructed by your doctor or health care professional. Stopping your medicine suddenly can increase your seizures or their severity. A special MedGuide will be given to you by the pharmacist with each prescription and refill. Be sure to read this information carefully each time. Contact your pediatrician or health care professional regarding the use of this medication in children. While this drug may be prescribed for children as young as 534 years of age for selected conditions, precautions do apply. Overdosage: If you think you have taken too much of this medicine contact a poison control center or emergency room at once. NOTE: This medicine is only for you. Do not share this medicine with others. What if I miss a dose? If you miss a dose, take it as soon as you can. If it is almost time for your next dose, take only that dose. Do not take double or extra doses. What may interact with this medicine? This medicine may interact with the following medications: -carbamazepine -colesevelam -probenecid -sevelamer This list may not describe all possible interactions. Give your health care provider a list of all the medicines, herbs, non-prescription drugs, or dietary supplements you use. Also tell them if you smoke, drink alcohol, or use illegal drugs. Some items may interact with your medicine. What should I watch for while using this medicine? Visit your doctor or health care professional for a regular check on your progress. Wear a medical identification bracelet or chain  to say you have epilepsy, and carry a card that lists all your medications. It is important to take this medicine exactly as instructed by your health care professional. When first starting treatment, your dose may need to be adjusted. It may take weeks or months before your dose is stable. You should contact your doctor or health care professional if  your seizures get worse or if you have any new types of seizures. You may get drowsy or dizzy. Do not drive, use machinery, or do anything that needs mental alertness until you know how this medicine affects you. Do not stand or sit up quickly, especially if you are an older patient. This reduces the risk of dizzy or fainting spells. Alcohol may interfere with the effect of this medicine. Avoid alcoholic drinks. The use of this medicine may increase the chance of suicidal thoughts or actions. Pay special attention to how you are responding while on this medicine. Any worsening of mood, or thoughts of suicide or dying should be reported to your health care professional right away. Women who become pregnant while using this medicine may enroll in the [place] American Antiepileptic Drug Pregnancy Registry by calling (717)[phone]. This registry collects information about the safety of antiepileptic drug use during pregnancy. What side effects may I notice from receiving this medicine? Side effects that you should report to your doctor or health care professional as soon as possible: -allergic reactions like skin rash, itching or hives, swelling of the face, lips, or tongue -breathing problems -dark urine -general ill feeling or flu-like symptoms -problems with balance, talking, walking -unusually weak or tired -worsening of mood, thoughts or actions of suicide or dying -yellowing of the eyes or skin Side effects that usually do not require medical attention (report to your doctor or health care professional if they continue or are bothersome): -diarrhea -dizzy, drowsy -headache -loss of appetite This list may not describe all possible side effects. Call your doctor for medical advice about side effects. You may report side effects to FDA at 1-800-FDA-1088. Where should I keep my medicine? Keep out of reach of children. Store at room temperature between 15 and 30 degrees C (59 and 86 degrees F).  Throw away any unused medicine after the expiration date. NOTE: This sheet is a summary. It may not cover all possible information. If you have questions about this medicine, talk to your doctor, pharmacist, or health care provider.  2017 Elsevier/Gold Standard (2015-08-28 09:43:54)

## 2016-08-14 DIAGNOSIS — R404 Transient alteration of awareness: Secondary | ICD-10-CM | POA: Insufficient documentation

## 2016-08-14 LAB — CBC
HEMOGLOBIN: 11.7 g/dL — AB (ref 13.0–17.7)
Hematocrit: 35 % — ABNORMAL LOW (ref 37.5–51.0)
MCH: 30 pg (ref 26.6–33.0)
MCHC: 33.4 g/dL (ref 31.5–35.7)
MCV: 90 fL (ref 79–97)
Platelets: 264 10*3/uL (ref 150–379)
RBC: 3.9 x10E6/uL — ABNORMAL LOW (ref 4.14–5.80)
RDW: 13.7 % (ref 12.3–15.4)
WBC: 12.7 10*3/uL — ABNORMAL HIGH (ref 3.4–10.8)

## 2016-08-14 LAB — COMPREHENSIVE METABOLIC PANEL
A/G RATIO: 1.5 (ref 1.2–2.2)
ALBUMIN: 4.1 g/dL (ref 3.5–4.7)
ALK PHOS: 131 IU/L — AB (ref 39–117)
ALT: 14 IU/L (ref 0–44)
AST: 23 IU/L (ref 0–40)
BILIRUBIN TOTAL: 0.4 mg/dL (ref 0.0–1.2)
BUN / CREAT RATIO: 14 (ref 10–24)
BUN: 16 mg/dL (ref 8–27)
CHLORIDE: 95 mmol/L — AB (ref 96–106)
CO2: 26 mmol/L (ref 18–29)
Calcium: 9.6 mg/dL (ref 8.6–10.2)
Creatinine, Ser: 1.15 mg/dL (ref 0.76–1.27)
GFR calc Af Amer: 67 mL/min/{1.73_m2} (ref >59)
GFR calc non Af Amer: 58 mL/min/{1.73_m2} — ABNORMAL LOW (ref >59)
Globulin, Total: 2.8 g/dL (ref 1.5–4.5)
Glucose: 100 mg/dL — ABNORMAL HIGH (ref 65–99)
POTASSIUM: 4.8 mmol/L (ref 3.5–5.2)
SODIUM: 137 mmol/L (ref 134–144)
Total Protein: 6.9 g/dL (ref 6.0–8.5)

## 2016-08-16 ENCOUNTER — Telehealth: Payer: Self-pay | Admitting: *Deleted

## 2016-08-16 NOTE — Telephone Encounter (Signed)
-----   Message from Anson FretAntonia B Ahern, MD sent at 08/14/2016  2:55 PM EST ----- Labs are stable, no significant changes. thanks

## 2016-08-16 NOTE — Telephone Encounter (Signed)
LVM about lab results per AA,MD note. Gave [facility] phone number if they have further questions or concerns.

## 2016-08-30 DIAGNOSIS — R5383 Other fatigue: Secondary | ICD-10-CM | POA: Diagnosis not present

## 2016-08-30 DIAGNOSIS — R52 Pain, unspecified: Secondary | ICD-10-CM | POA: Diagnosis not present

## 2016-09-03 NOTE — Progress Notes (Signed)
Cardiology Office Note   Date:  09/15/2016   ID:  Nicholas Gaines, DOB 08/12/1928, MRN [id number]  PCP:  Sissy HoffSWAYNE,DAVID W, MD  Cardiologist:   Charlton HawsPeter Nishan, MD   No chief complaint on file.     History of Present Illness: Nicholas Herrlichllen Rockhold is a 83 y.o. male who presents for evaluation of aortic valve disease Recent d/c from [facility] For change MS and ? Seizures. Told to take DAT with plavix and started on  Trileptal with plans for further Outpatient EEG monitoring as inpatient was negative. Apparently an outside echo suggested LA mass I  Believe this was done at Dr York Spanielilley's office I did his f/u MRI which showed no LA mass just a huge hiatal  Hernia with likely aortic sclerosis accounting for murmur and no stenosis EF 65% and no source of embolus There was moderate bi-atrial enlargement .  [facility] records history of A-fib but not anticoagulated   CT/MRI head showed chronic microvascular ischemic changes   Back in ER 09/09/16 with "word salad"  Neurology had changed him to Keppra for partial Complex seizures Dr Lucia GaskinsAhern CT and CTA negative Keppra increased and d/c home Patient Not in afib at time of ER evaluation   Labs reviewed:  LDL 104 Cr 1.02 TSH normal A1c5.9  On zetia not tolerant to statins   Past Medical History:  Diagnosis Date  . A-fib (HCC)   . Arthritis   . GERD (gastroesophageal reflux disease)   . Heart murmur   . HLD (hyperlipidemia)   . Stroke [facility](HCC)    tia    Past Surgical History:  Procedure Laterality Date  . CARPAL TUNNEL RELEASE     About 10 yr ago (08/13/16)  . TONSILLECTOMY       Current Outpatient Prescriptions  Medication Sig Dispense Refill  . acetaminophen (TYLENOL) 500 MG tablet Take 1,000 mg by mouth every 6 (six) hours as needed for fever.     Marland Kitchen. aspirin 325 MG tablet Take 325 mg by mouth daily.    . Cholecalciferol 1000 units capsule Take 1,000 Units by mouth daily.    . clopidogrel (PLAVIX) 75 MG tablet Take 75 mg by mouth daily.   0  . Cyanocobalamin  (B-12) 2500 MCG TABS Take 2,500 mcg by mouth daily.     . ferrous sulfate 325 (65 FE) MG EC tablet Take 325 mg by mouth daily with breakfast.    . ibuprofen (ADVIL,MOTRIN) 200 MG tablet Take 600 mg by mouth every 6 (six) hours as needed for fever.    . levETIRAcetam (KEPPRA) 750 MG tablet Take 1 tablet (750 mg total) by mouth 2 (two) times daily. 60 tablet 0  . LYCOPENE PO Take 1 tablet by mouth daily.    . Oxcarbazepine (TRILEPTAL) 300 MG tablet Take 300 mg by mouth 2 (two) times daily.  0  . ranitidine (ZANTAC) 150 MG tablet Take 150 mg by mouth 2 (two) times daily.   0  . Saw Palmetto 450 MG CAPS Take 450 mg by mouth daily.     Marland Kitchen. ZETIA 10 MG tablet Take 10 mg by mouth daily.   0   No current facility-administered medications for this visit.     Allergies:   Rocephin [ceftriaxone sodium in dextrose]; Atorvastatin; Pravastatin; and Rosuvastatin calcium    Social History:  The patient  reports that he has never smoked. He has never used smokeless tobacco. He reports that he does not drink alcohol or use drugs.   Family  History:  The patient's family history includes CVA in his father; Heart attack in his mother; Hypertension in his mother; Lymphoma in his sister; Parkinson's disease in his mother; Stroke in his brother, paternal grandfather, paternal grandmother, and sister.    ROS:  Please see the history of present illness.   Otherwise, review of systems are positive for none.   All other systems are reviewed and negative.    PHYSICAL EXAM: VS:  There were no vitals taken for this visit. , BMI There is no height or weight on file to calculate BMI. Affect appropriate Healthy:  appears stated age HEENT: normal Neck supple with no adenopathy JVP normal no bruits no thyromegaly Lungs clear with no wheezing and good diaphragmatic motion Heart:  S1/S2 no murmur, no rub, gallop or click PMI normal Abdomen: benighn, BS positve, no tenderness, no AAA no bruit.  No HSM or HJR Distal  pulses intact with no bruits No edema Neuro non-focal Skin warm and dry No muscular weakness    EKG:  08/05/16  SR PAC otherwise normal    Recent Labs: 09/09/2016: ALT 14; BUN 14; Creatinine, Ser 1.10; Hemoglobin 12.2; Platelets 179; Potassium 4.0; Sodium 142    Lipid Panel    Component Value Date/Time   CHOL 120 08/03/2016 0529   TRIG 71 08/03/2016 0529   HDL 37 (L) 08/03/2016 0529   CHOLHDL 3.2 08/03/2016 0529   VLDL 14 08/03/2016 0529   LDLCALC 69 08/03/2016 0529      Wt Readings from Last 3 Encounters:  08/13/16 164 lb 3.2 oz (74.5 kg)  08/02/16 157 lb (71.2 kg)  05/26/15 160 lb (72.6 kg)      Other studies Reviewed: Additional studies/ records that were reviewed today include: EEG, CT, MRI multiple ER notes Neuro notes Dr Lucia Gaskins and echo Dr Donnie Aho .    ASSESSMENT AND PLAN:  1. Neurologic:  Concern that with history of previous stroke, Atrial enlargement and age Could be having PAF. Discussed personally with Dr Johney Frame who came to see patient today Feel ILR is advisable to r/o PAF as source of TIA or CNS injury that could ppt seizures 2. Seizures:  keppra increased Stereotypical episodes do suggest this Needs repeat EEG ? With sleep deprivation.  3. Echo: done at Dr Tillley's murmur is not bad AS no LA mass Likely confused with large hiatal hernia  After lengthy discussion and coordinating with Dr Johney Frame arrange ILR for tomorrow Risk of infection discussed willing to proceed    Current medicines are reviewed at length with the patient today.  The patient does not have concerns regarding medicines.  The following changes have been made:  no change  Labs/ tests ordered today include: ILR  No orders of the defined types were placed in this encounter.    Disposition:   FU with me in 6 months and ILR to be monitored by EP      Signed, Charlton Haws, MD  09/15/2016 9:56 AM    [facility] [address] [place], [place], [place]   [address] Phone: [phone]; Fax: [phone]

## 2016-09-09 ENCOUNTER — Telehealth: Payer: Self-pay | Admitting: Neurology

## 2016-09-09 ENCOUNTER — Encounter (HOSPITAL_COMMUNITY): Payer: Self-pay | Admitting: Emergency Medicine

## 2016-09-09 ENCOUNTER — Emergency Department (HOSPITAL_COMMUNITY)
Admission: EM | Admit: 2016-09-09 | Discharge: 2016-09-09 | Disposition: A | Discharge status: Home / Self Care | Payer: Medicare Other | Attending: Emergency Medicine | Admitting: Emergency Medicine

## 2016-09-09 ENCOUNTER — Emergency Department (HOSPITAL_COMMUNITY): Payer: Medicare Other

## 2016-09-09 ENCOUNTER — Encounter: Payer: Self-pay | Admitting: *Deleted

## 2016-09-09 DIAGNOSIS — R7303 Prediabetes: Secondary | ICD-10-CM | POA: Insufficient documentation

## 2016-09-09 DIAGNOSIS — I69392 Facial weakness following cerebral infarction: Secondary | ICD-10-CM | POA: Insufficient documentation

## 2016-09-09 DIAGNOSIS — Z8673 Personal history of transient ischemic attack (TIA), and cerebral infarction without residual deficits: Secondary | ICD-10-CM | POA: Insufficient documentation

## 2016-09-09 DIAGNOSIS — R4182 Altered mental status, unspecified: Secondary | ICD-10-CM | POA: Diagnosis present

## 2016-09-09 DIAGNOSIS — G40219 Localization-related (focal) (partial) symptomatic epilepsy and epileptic syndromes with complex partial seizures, intractable, without status epilepticus: Secondary | ICD-10-CM

## 2016-09-09 DIAGNOSIS — G40209 Localization-related (focal) (partial) symptomatic epilepsy and epileptic syndromes with complex partial seizures, not intractable, without status epilepticus: Secondary | ICD-10-CM | POA: Diagnosis not present

## 2016-09-09 DIAGNOSIS — I10 Essential (primary) hypertension: Secondary | ICD-10-CM | POA: Insufficient documentation

## 2016-09-09 DIAGNOSIS — I359 Nonrheumatic aortic valve disorder, unspecified: Secondary | ICD-10-CM | POA: Insufficient documentation

## 2016-09-09 DIAGNOSIS — D649 Anemia, unspecified: Secondary | ICD-10-CM | POA: Insufficient documentation

## 2016-09-09 DIAGNOSIS — Z7982 Long term (current) use of aspirin: Secondary | ICD-10-CM | POA: Diagnosis not present

## 2016-09-09 DIAGNOSIS — R011 Cardiac murmur, unspecified: Secondary | ICD-10-CM | POA: Insufficient documentation

## 2016-09-09 DIAGNOSIS — R410 Disorientation, unspecified: Secondary | ICD-10-CM | POA: Diagnosis not present

## 2016-09-09 DIAGNOSIS — I358 Other nonrheumatic aortic valve disorders: Secondary | ICD-10-CM | POA: Insufficient documentation

## 2016-09-09 DIAGNOSIS — Z79899 Other long term (current) drug therapy: Secondary | ICD-10-CM | POA: Diagnosis not present

## 2016-09-09 DIAGNOSIS — I679 Cerebrovascular disease, unspecified: Secondary | ICD-10-CM | POA: Diagnosis not present

## 2016-09-09 DIAGNOSIS — N400 Enlarged prostate without lower urinary tract symptoms: Secondary | ICD-10-CM | POA: Insufficient documentation

## 2016-09-09 DIAGNOSIS — G40802 Other epilepsy, not intractable, without status epilepticus: Secondary | ICD-10-CM | POA: Diagnosis not present

## 2016-09-09 DIAGNOSIS — I6930 Unspecified sequelae of cerebral infarction: Secondary | ICD-10-CM | POA: Insufficient documentation

## 2016-09-09 DIAGNOSIS — E559 Vitamin D deficiency, unspecified: Secondary | ICD-10-CM | POA: Insufficient documentation

## 2016-09-09 LAB — COMPREHENSIVE METABOLIC PANEL
ALK PHOS: 93 U/L (ref 38–126)
ALT: 14 U/L — ABNORMAL LOW (ref 17–63)
AST: 23 U/L (ref 15–41)
Albumin: 4 g/dL (ref 3.5–5.0)
Anion gap: 9 (ref 5–15)
BILIRUBIN TOTAL: 0.6 mg/dL (ref 0.3–1.2)
BUN: 14 mg/dL (ref 6–20)
CALCIUM: 9.5 mg/dL (ref 8.9–10.3)
CO2: 27 mmol/L (ref 22–32)
Chloride: 106 mmol/L (ref 101–111)
Creatinine, Ser: 1.1 mg/dL (ref 0.61–1.24)
GFR calc Af Amer: >60 mL/min (ref >60)
GFR calc non Af Amer: 59 mL/min — ABNORMAL LOW (ref >60)
GLUCOSE: 105 mg/dL — AB (ref 65–99)
Potassium: 4 mmol/L (ref 3.5–5.1)
Sodium: 142 mmol/L (ref 135–145)
TOTAL PROTEIN: 6.9 g/dL (ref 6.5–8.1)

## 2016-09-09 LAB — CBC
HCT: 37.6 % — ABNORMAL LOW (ref 39.0–52.0)
Hemoglobin: 12.2 g/dL — ABNORMAL LOW (ref 13.0–17.0)
MCH: 29.5 pg (ref 26.0–34.0)
MCHC: 32.4 g/dL (ref 30.0–36.0)
MCV: 91 fL (ref 78.0–100.0)
Platelets: 179 K/uL (ref 150–400)
RBC: 4.13 MIL/uL — ABNORMAL LOW (ref 4.22–5.81)
RDW: 13.3 % (ref 11.5–15.5)
WBC: 6.3 K/uL (ref 4.0–10.5)

## 2016-09-09 MED ORDER — IOPAMIDOL (ISOVUE-370) INJECTION 76%
INTRAVENOUS | Status: AC
  Administered 2016-09-09: 50 mL
  Filled 2016-09-09: qty 50

## 2016-09-09 MED ORDER — LEVETIRACETAM 750 MG PO TABS: 750.0000 mg | ORAL_TABLET | Freq: Two times a day (BID) | ORAL | 0 refills | Status: DC

## 2016-09-09 NOTE — Telephone Encounter (Signed)
Pt's daughter called said he is having slurred speech this morning. She was wanting to know if she should take him to ED or if provider would want to see him, she is wanting TIA or seizure r/o. Rn was skyped and relayed he should be taken to ED asap and they could f/u with clinic after he's seen if they want him to f/u with neurology. Msg relayed, daughter said she is taking him to ED.

## 2016-09-09 NOTE — Telephone Encounter (Signed)
Dr Ahern- FYI 

## 2016-09-09 NOTE — ED Provider Notes (Signed)
[facility]-EMERGENCY DEPT Provider Note   CSN: [id number] Arrival date & time: 09/09/16  1048     History   Chief Complaint Chief Complaint  Patient presents with  . Altered Mental Status    HPI Nicholas Gaines is a 83 y.o. male.  HPI Patient presents to the emergency department with "word salad" for approximately 2-1/2 hours.  He has had several other episodes like this is been told that he has partial complex seizures.  He was initially started on Trileptal which was switched to Keppra by his neurologist Dr.Ahern.  This resolved on arrival to the emergency department.  No weakness of the arms or legs.  No fevers or chills.  Patient had an MRI in November which was normal.  He has not had an echocardiogram or a carotid Doppler done to this point.  This was not thought to be TIA by his neurology team.  Given his prolonged symptoms days brought to the ER for further evaluation.  Currently is without any complaint   Past Medical History:  Diagnosis Date  . A-fib (HCC)   . Arthritis   . GERD (gastroesophageal reflux disease)   . Heart murmur   . HLD (hyperlipidemia)   . Stroke [facility])    tia    Patient Active Problem List   Diagnosis Date Noted  . Anemia 09/09/2016  . Aortic valve calcification 09/09/2016  . Aortic valve sclerosis 09/09/2016  . BPH (benign prostatic hyperplasia) 09/09/2016  . Cardiac murmur 09/09/2016  . Essential (primary) hypertension 09/09/2016  . Facial weakness status post cerebrovascular accident 09/09/2016  . H/O: stroke with residual effects 09/09/2016  . History of stroke 09/09/2016  . Prediabetes 09/09/2016  . Vitamin D deficiency 09/09/2016  . Alteration of consciousness 08/14/2016  . Acute encephalopathy 08/02/2016  . Difficulty speaking 08/02/2016  . Bronchitis 08/02/2016  . Mixed hyperlipidemia 08/02/2016  . Stroke (HCC)   . GERD (gastroesophageal reflux disease)   . Gastroesophageal reflux disease without esophagitis   . Transient cerebral  ischemia     Past Surgical History:  Procedure Laterality Date  . CARPAL TUNNEL RELEASE     About 10 yr ago (08/13/16)  . TONSILLECTOMY         Home Medications    Prior to Admission medications   Medication Sig Start Date End Date Taking? Authorizing Provider  acetaminophen (TYLENOL) 500 MG tablet Take 1,000 mg by mouth every 6 (six) hours as needed for fever.    Yes Historical Provider, MD  aspirin 325 MG tablet Take 325 mg by mouth daily.   Yes Historical Provider, MD  Cholecalciferol 1000 units capsule Take 1,000 Units by mouth daily.   Yes Historical Provider, MD  clopidogrel (PLAVIX) 75 MG tablet Take 75 mg by mouth daily.  05/22/15  Yes Historical Provider, MD  Cyanocobalamin (B-12) 2500 MCG TABS Take 2,500 mcg by mouth daily.    Yes Historical Provider, MD  ferrous sulfate 325 (65 FE) MG EC tablet Take 325 mg by mouth daily with breakfast.   Yes Historical Provider, MD  ibuprofen (ADVIL,MOTRIN) 200 MG tablet Take 600 mg by mouth every 6 (six) hours as needed for fever.   Yes Historical Provider, MD  levETIRAcetam (KEPPRA) 500 MG tablet Take 1 tablet (500 mg total) by mouth 2 (two) times daily. 08/13/16  Yes Anson Fret, MD  LYCOPENE PO Take 1 tablet by mouth daily.   Yes Historical Provider, MD  Oxcarbazepine (TRILEPTAL) 300 MG tablet Take 300 mg by mouth 2 (two)  times daily. 08/03/16  Yes Historical Provider, MD  ranitidine (ZANTAC) 150 MG tablet Take 150 mg by mouth 2 (two) times daily.  05/22/15  Yes Historical Provider, MD  Saw Palmetto 450 MG CAPS Take 450 mg by mouth daily.    Yes Historical Provider, MD  ZETIA 10 MG tablet Take 10 mg by mouth daily.  05/22/15  Yes Historical Provider, MD    Family History Family History  Problem Relation Age of Onset  . Hypertension Mother   . Parkinson's disease Mother   . Heart attack Mother   . Stroke Sister   . Lymphoma Sister   . Stroke Brother   . CVA Father   . Stroke Paternal Grandmother     in her early 3770's  .  Stroke Paternal Grandfather     in his early 6770's  . Diabetes Neg Hx     Social History Social History  Substance Use Topics  . Smoking status: Never Smoker  . Smokeless tobacco: Never Used  . Alcohol use No     Allergies   Rocephin [ceftriaxone sodium in dextrose]; Atorvastatin; Pravastatin; and Rosuvastatin calcium   Review of Systems Review of Systems  All other systems reviewed and are negative.    Physical Exam Updated Vital Signs BP (!) 181/107   Pulse 68   Temp 98 F (36.7 C)   Resp 20   SpO2 98%   Physical Exam  Constitutional: He is oriented to person, place, and time. He appears well-developed and well-nourished.  HENT:  Head: Normocephalic and atraumatic.  Eyes: EOM are normal. Pupils are equal, round, and reactive to light.  Neck: Normal range of motion.  Cardiovascular: Normal rate, regular rhythm, normal heart sounds and intact distal pulses.   Pulmonary/Chest: Effort normal and breath sounds normal. No respiratory distress.  Abdominal: Soft. He exhibits no distension. There is no tenderness.  Musculoskeletal: Normal range of motion.  Neurological: He is alert and oriented to person, place, and time.  5/5 strength in major muscle groups of  bilateral upper and lower extremities. Speech normal. No facial asymetry.   Skin: Skin is warm and dry.  Psychiatric: He has a normal mood and affect. Judgment normal.  Nursing note and vitals reviewed.    ED Treatments / Results  Labs (all labs ordered are listed, but only abnormal results are displayed) Labs Reviewed  COMPREHENSIVE METABOLIC PANEL - Abnormal; Notable for the following:       Result Value   Glucose, Bld 105 (*)    ALT 14 (*)    GFR calc non Af Amer 59 (*)    All other components within normal limits  CBC - Abnormal; Notable for the following:    RBC 4.13 (*)    Hemoglobin 12.2 (*)    HCT 37.6 (*)    All other components within normal limits  CBG MONITORING, ED    EKG  EKG  Interpretation None       Radiology Ct Head Wo Contrast  Result Date: 09/09/2016 CLINICAL DATA:  Increased confusion and difficulty with speech. EXAM: CT HEAD WITHOUT CONTRAST TECHNIQUE: Contiguous axial images were obtained from the base of the skull through the vertex without intravenous contrast. COMPARISON:  Head CT dated 08/02/2016 and brain MRI dated 06/22/2016. FINDINGS: Brain: There is generalized age related parenchymal atrophy with commensurate dilatation of the ventricles and sulci. Chronic small vessel ischemic changes noted within the periventricular and subcortical white matter regions bilaterally. Additional small old lacunar infarcts noted within  each basal ganglia region. There is no mass, hemorrhage, edema or other evidence of acute parenchymal abnormality. No extra-axial hemorrhage. Vascular: There are chronic calcified atherosclerotic changes of the large vessels at the skull base. No unexpected hyperdense vessel. Skull: Normal. Negative for fracture or focal lesion. Sinuses/Orbits: No acute finding. Other: None. IMPRESSION: 1. No acute findings.  No intracranial mass, hemorrhage or edema. 2. Atrophy and chronic ischemic changes as detailed above. Electronically Signed   By: Bary Richard M.D.   On: 09/09/2016 14:07    Procedures Procedures (including critical care time)  Medications Ordered in ED Medications - No data to display   Initial Impression / Assessment and Plan / ED Course  I have reviewed the triage vital signs and the nursing notes.  Pertinent labs & imaging results that were available during my care of the patient were reviewed by me and considered in my medical decision making (see chart for details).     CT head negative.  I spoke with neurology is recommending CT angiogram head to evaluate for possible stenosis.  Is felt that this is negative he can be discharged safely to home to follow-up with his neurologist.  He is asymptomatic at this time.  I think  is very reasonable.  If CT angios negative we'll increase his Keppra to 750 mg by mouth twice a day.  I will obtain the family.  Final Clinical Impressions(s) / ED Diagnoses   Final diagnoses:  None    New Prescriptions New Prescriptions   No medications on file     Azalia Bilis, MD 09/09/16 [phone]

## 2016-09-09 NOTE — ED Triage Notes (Signed)
Pt arrives with his sister via pov, per pts sister pt started having slurred speech around 10am, however, pt reports around 0830 this am he had trouble finding his words. Pt is a/o, speech clear, grip strength equal bilaterally. Stroke assessment negative in triage.

## 2016-09-09 NOTE — ED Notes (Signed)
Patient transported to CT 

## 2016-09-09 NOTE — Discharge Instructions (Signed)
Your CT scan of your brain showed a small aneurysm.  Please follow up with your family doctor for recheck of your blood pressure.  Get rechecked immediately if you have any new or worrisome symptoms.

## 2016-09-09 NOTE — Telephone Encounter (Signed)
Definitely ED thanks

## 2016-09-15 ENCOUNTER — Ambulatory Visit (INDEPENDENT_AMBULATORY_CARE_PROVIDER_SITE_OTHER): Payer: Medicare Other | Admitting: Internal Medicine

## 2016-09-15 ENCOUNTER — Encounter: Payer: Self-pay | Admitting: Cardiovascular Disease

## 2016-09-15 ENCOUNTER — Ambulatory Visit (INDEPENDENT_AMBULATORY_CARE_PROVIDER_SITE_OTHER): Payer: Medicare Other | Admitting: Cardiovascular Disease

## 2016-09-15 VITALS — BP 120/80 | HR 59 | Ht 67.0 in | Wt 162.4 lb

## 2016-09-15 DIAGNOSIS — Z7689 Persons encountering health services in other specified circumstances: Secondary | ICD-10-CM | POA: Diagnosis not present

## 2016-09-15 DIAGNOSIS — R002 Palpitations: Secondary | ICD-10-CM

## 2016-09-15 DIAGNOSIS — Z8673 Personal history of transient ischemic attack (TIA), and cerebral infarction without residual deficits: Secondary | ICD-10-CM

## 2016-09-15 NOTE — Patient Instructions (Addendum)
Medication Instructions:  Your physician recommends that you continue on your current medications as directed. Please refer to the Current Medication list given to you today.  Labwork: NONE  Testing/Procedures: Your physician has requested that you have a loop recorder (LINQ) with Dr. Johney FrameAllred tomorrow at 6:30 am.   Follow-Up: Your physician wants you to follow-up in: 6 months with Dr. Eden EmmsNishan. You will receive a reminder letter in the mail two months in advance. If you don't receive a letter, please call our office to schedule the follow-up appointment.  Your physician wants you to follow-up in: 10 to 14 days with [facility].    If you need a refill on your cardiac medications before your next appointment, please call your pharmacy.

## 2016-09-15 NOTE — Progress Notes (Signed)
Electrophysiology Office Note   Date:  09/15/2016   ID:  Nicholas Gaines, DOB 12/15/1928, MRN [id number]  PCP:  Sissy HoffSWAYNE,DAVID W, MD  Cardiologist:  Dr Eden EmmsNishan Primary Electrophysiologist: Hillis RangeJames Keelin Neville, MD    CC: palpitations   History of Present Illness: Nicholas Herrlichllen Resetar is a 83 y.o. male who presents today for electrophysiology evaluation.   He has had episodic difficulty is speaking for which he has ongoing neurologic evaluation.  There is concern for seizures.  He has been initiated on antiepileptic therapy with some improvement.  He also have valvular heart disease with significant atriopathy.   He has had a prior stroke.  CT reveals only chronic microvascular ischemic changes. The patient has palpitations.  There is concern by Dr Eden EmmsNishan that the patient may also have afib complicating his medical presentation.  The patient and Dr Eden EmmsNishan both feel that arrhythmic monitoring to evaluate for the cause of his palpitations and also exclude afib as the cause of his recent symptoms.  He has worn telemetry previously during his hospitalization in December during which he did not have arrhythmias detected.  Today, he denies symptoms of  chest pain, shortness of breath, orthopnea, PND, lower extremity edema, claudication, dizziness, presyncope, syncope, or bleeding. The patient is tolerating medications without difficulties and is otherwise without complaint today.    Past Medical History:  Diagnosis Date  . A-fib (HCC)   . Arthritis   . GERD (gastroesophageal reflux disease)   . Heart murmur   . HLD (hyperlipidemia)   . Stroke [facility](HCC)    tia   Past Surgical History:  Procedure Laterality Date  . CARPAL TUNNEL RELEASE     About 10 yr ago (08/13/16)  . TONSILLECTOMY       Current Outpatient Prescriptions  Medication Sig Dispense Refill  . aspirin 325 MG tablet Take 325 mg by mouth daily.    . Cholecalciferol 1000 units capsule Take 4,000 Units by mouth daily.     . clopidogrel (PLAVIX) 75  MG tablet Take 75 mg by mouth daily.   0  . ferrous sulfate 325 (65 FE) MG EC tablet Take 325 mg by mouth daily with breakfast.    . ibuprofen (ADVIL,MOTRIN) 200 MG tablet Take 600 mg by mouth daily as needed for headache or moderate pain.     Marland Kitchen. levETIRAcetam (KEPPRA) 750 MG tablet Take 1 tablet (750 mg total) by mouth 2 (two) times daily. 60 tablet 0  . LYCOPENE PO Take 10 mg by mouth daily.     . Multiple Vitamins-Minerals (ICAPS) CAPS Take 1 capsule by mouth daily.    . Omega-3 Fatty Acids (FISH OIL) 1000 MG CAPS Take 2,000 mg by mouth daily.    . ranitidine (ZANTAC) 150 MG tablet Take 300 mg by mouth daily.   0  . Saw Palmetto 450 MG CAPS Take 900 mg by mouth daily.     . vitamin B-12 (CYANOCOBALAMIN) 1000 MCG tablet Take 1,000 mcg by mouth daily.    Marland Kitchen. ZETIA 10 MG tablet Take 10 mg by mouth daily.   0   No current facility-administered medications for this visit.     Allergies:   Rocephin [ceftriaxone sodium in dextrose]; Atorvastatin; Pravastatin; and Rosuvastatin calcium   Social History:  The patient  reports that he has never smoked. He has never used smokeless tobacco. He reports that he does not drink alcohol or use drugs.   Family History:  The patient's  family history includes CVA in his father; Heart  attack in his mother; Hypertension in his mother; Lymphoma in his sister; Parkinson's disease in his mother; Stroke in his brother, paternal grandfather, paternal grandmother, and sister.    ROS:  Please see the history of present illness.   All other systems are reviewed and negative.    PHYSICAL EXAM: VS:  BP 120/80, HR 59, sats 98 %, Wt 162 lbs GEN: Well nourished, well developed, in no acute distress  HEENT: normal  Neck: no JVD, carotid bruits, or masses Cardiac: RRR; no murmurs, rubs, or gallops,no edema  Respiratory:  clear to auscultation bilaterally, normal work of breathing GI: soft, nontender, nondistended, + BS MS: no deformity or atrophy  Skin: warm and dry    Neuro:  Strength and sensation are intact Psych: euthymic mood, full affect  EKG:  EKG is not ordered today. The ekgs in epic are reviewed and reveal sinus rhythm   Recent Labs: 09/09/2016: ALT 14; BUN 14; Creatinine, Ser 1.10; Hemoglobin 12.2; Platelets 179; Potassium 4.0; Sodium 142    Lipid Panel     Component Value Date/Time   CHOL 120 08/03/2016 0529   TRIG 71 08/03/2016 0529   HDL 37 (L) 08/03/2016 0529   CHOLHDL 3.2 08/03/2016 0529   VLDL 14 08/03/2016 0529   LDLCALC 69 08/03/2016 0529     Wt Readings from Last 3 Encounters:  09/15/16 162 lb 6.4 oz (73.7 kg)  08/13/16 164 lb 3.2 oz (74.5 kg)  08/02/16 157 lb (71.2 kg)      Other studies Reviewed: Additional studies/ records that were reviewed today include: Dr Fabio Bering notes  Review of the above records today demonstrates: as above   ASSESSMENT AND PLAN:  1.  Palpitations The patient has symptomatic palpitations of unclear etiology.  He has significant atriopathy and there is concern for afib as the case.  There is also a h/o prior stroke.  Dr Eden Emms feels that further evaluation for atrial fibrillation is prudent.  He has worn telemetry previously during his hospitalization which was unrevealing.  Risks and benefits to ILR were discussed with patient and family today.  They understand risks and wish to proceed.  We will therefore proceed with ILR placement at the next available time.  Current medicines are reviewed at length with the patient today.   The patient does not have concerns regarding his medicines.  The following changes were made today:  none  Signed, Hillis Range, MD  09/15/2016      [facility] [address] [place] [address] [phone] (office) [phone] (fax)

## 2016-09-16 ENCOUNTER — Ambulatory Visit (HOSPITAL_COMMUNITY)
Admission: RE | Admit: 2016-09-16 | Discharge: 2016-09-16 | Disposition: A | Discharge status: Home / Self Care | Payer: Medicare Other | Source: Ambulatory Visit | Attending: Internal Medicine | Admitting: Internal Medicine

## 2016-09-16 ENCOUNTER — Encounter (HOSPITAL_COMMUNITY): Payer: Self-pay | Admitting: Internal Medicine

## 2016-09-16 ENCOUNTER — Encounter (HOSPITAL_COMMUNITY)
Admission: RE | Disposition: A | Discharge status: Home / Self Care | Payer: Self-pay | Source: Ambulatory Visit | Attending: Internal Medicine

## 2016-09-16 DIAGNOSIS — Z8673 Personal history of transient ischemic attack (TIA), and cerebral infarction without residual deficits: Secondary | ICD-10-CM | POA: Insufficient documentation

## 2016-09-16 DIAGNOSIS — Z8249 Family history of ischemic heart disease and other diseases of the circulatory system: Secondary | ICD-10-CM | POA: Diagnosis not present

## 2016-09-16 DIAGNOSIS — Z7902 Long term (current) use of antithrombotics/antiplatelets: Secondary | ICD-10-CM | POA: Diagnosis not present

## 2016-09-16 DIAGNOSIS — Z7982 Long term (current) use of aspirin: Secondary | ICD-10-CM | POA: Insufficient documentation

## 2016-09-16 DIAGNOSIS — Z79899 Other long term (current) drug therapy: Secondary | ICD-10-CM | POA: Insufficient documentation

## 2016-09-16 DIAGNOSIS — Z823 Family history of stroke: Secondary | ICD-10-CM | POA: Diagnosis not present

## 2016-09-16 DIAGNOSIS — K219 Gastro-esophageal reflux disease without esophagitis: Secondary | ICD-10-CM | POA: Diagnosis not present

## 2016-09-16 DIAGNOSIS — R002 Palpitations: Secondary | ICD-10-CM

## 2016-09-16 DIAGNOSIS — I639 Cerebral infarction, unspecified: Secondary | ICD-10-CM | POA: Diagnosis present

## 2016-09-16 HISTORY — PX: LOOP RECORDER INSERTION: EP1214

## 2016-09-16 SURGERY — LOOP RECORDER INSERTION

## 2016-09-16 MED ORDER — LIDOCAINE HCL (PF) 1 % IJ SOLN
INTRAMUSCULAR | Status: AC
  Filled 2016-09-16: qty 30

## 2016-09-16 MED ORDER — LIDOCAINE HCL (PF) 1 % IJ SOLN
INTRAMUSCULAR | Status: DC | PRN
  Administered 2016-09-16: 5 mL

## 2016-09-16 SURGICAL SUPPLY — 2 items
LOOP REVEAL LINQSYS (Prosthesis & Implant Heart) ×1 IMPLANT
PACK LOOP INSERTION (CUSTOM PROCEDURE TRAY) ×2 IMPLANT

## 2016-09-16 NOTE — H&P (View-Only) (Signed)
Electrophysiology Office Note   Date:  09/15/2016   ID:  Nicholas Gaines, DOB 12/15/1928, MRN [id number]  PCP:  Nicholas HoffSWAYNE,Nicholas W, MD  Cardiologist:  Dr Nicholas Gaines Primary Electrophysiologist: Nicholas RangeJames Kire Ferg, MD    CC: palpitations   History of Present Illness: Nicholas Gaines is a 83 y.o. male who presents today for electrophysiology evaluation.   He has had episodic difficulty is speaking for which he has ongoing neurologic evaluation.  There is concern for seizures.  He has been initiated on antiepileptic therapy with some improvement.  He also have valvular heart disease with significant atriopathy.   He has had a prior stroke.  CT reveals only chronic microvascular ischemic changes. The patient has palpitations.  There is concern by Dr Nicholas Gaines that the patient may also have afib complicating his medical presentation.  The patient and Dr Nicholas Gaines both feel that arrhythmic monitoring to evaluate for the cause of his palpitations and also exclude afib as the cause of his recent symptoms.  He has worn telemetry previously during his hospitalization in December during which he did not have arrhythmias detected.  Today, he denies symptoms of  chest pain, shortness of breath, orthopnea, PND, lower extremity edema, claudication, dizziness, presyncope, syncope, or bleeding. The patient is tolerating medications without difficulties and is otherwise without complaint today.    Past Medical History:  Diagnosis Date  . A-fib (HCC)   . Arthritis   . GERD (gastroesophageal reflux disease)   . Heart murmur   . HLD (hyperlipidemia)   . Stroke [facility](HCC)    tia   Past Surgical History:  Procedure Laterality Date  . CARPAL TUNNEL RELEASE     About 10 yr ago (08/13/16)  . TONSILLECTOMY       Current Outpatient Prescriptions  Medication Sig Dispense Refill  . aspirin 325 MG tablet Take 325 mg by mouth daily.    . Cholecalciferol 1000 units capsule Take 4,000 Units by mouth daily.     . clopidogrel (PLAVIX) 75  MG tablet Take 75 mg by mouth daily.   0  . ferrous sulfate 325 (65 FE) MG EC tablet Take 325 mg by mouth daily with breakfast.    . ibuprofen (ADVIL,MOTRIN) 200 MG tablet Take 600 mg by mouth daily as needed for headache or moderate pain.     Marland Kitchen. levETIRAcetam (KEPPRA) 750 MG tablet Take 1 tablet (750 mg total) by mouth 2 (two) times daily. 60 tablet 0  . LYCOPENE PO Take 10 mg by mouth daily.     . Multiple Vitamins-Minerals (ICAPS) CAPS Take 1 capsule by mouth daily.    . Omega-3 Fatty Acids (FISH OIL) 1000 MG CAPS Take 2,000 mg by mouth daily.    . ranitidine (ZANTAC) 150 MG tablet Take 300 mg by mouth daily.   0  . Saw Palmetto 450 MG CAPS Take 900 mg by mouth daily.     . vitamin B-12 (CYANOCOBALAMIN) 1000 MCG tablet Take 1,000 mcg by mouth daily.    Marland Kitchen. ZETIA 10 MG tablet Take 10 mg by mouth daily.   0   No current facility-administered medications for this visit.     Allergies:   Rocephin [ceftriaxone sodium in dextrose]; Atorvastatin; Pravastatin; and Rosuvastatin calcium   Social History:  The patient  reports that he has never smoked. He has never used smokeless tobacco. He reports that he does not drink alcohol or use drugs.   Family History:  The patient's  family history includes CVA in his father; Heart  attack in his mother; Hypertension in his mother; Lymphoma in his sister; Parkinson's disease in his mother; Stroke in his brother, paternal grandfather, paternal grandmother, and sister.    ROS:  Please see the history of present illness.   All other systems are reviewed and negative.    PHYSICAL EXAM: VS:  BP 120/80, HR 59, sats 98 %, Wt 162 lbs GEN: Well nourished, well developed, in no acute distress  HEENT: normal  Neck: no JVD, carotid bruits, or masses Cardiac: RRR; no murmurs, rubs, or gallops,no edema  Respiratory:  clear to auscultation bilaterally, normal work of breathing GI: soft, nontender, nondistended, + BS MS: no deformity or atrophy  Skin: warm and dry    Neuro:  Strength and sensation are intact Psych: euthymic mood, full affect  EKG:  EKG is not ordered today. The ekgs in epic are reviewed and reveal sinus rhythm   Recent Labs: 09/09/2016: ALT 14; BUN 14; Creatinine, Ser 1.10; Hemoglobin 12.2; Platelets 179; Potassium 4.0; Sodium 142    Lipid Panel     Component Value Date/Time   CHOL 120 08/03/2016 0529   TRIG 71 08/03/2016 0529   HDL 37 (L) 08/03/2016 0529   CHOLHDL 3.2 08/03/2016 0529   VLDL 14 08/03/2016 0529   LDLCALC 69 08/03/2016 0529     Wt Readings from Last 3 Encounters:  09/15/16 162 lb 6.4 oz (73.7 kg)  08/13/16 164 lb 3.2 oz (74.5 kg)  08/02/16 157 lb (71.2 kg)      Other studies Reviewed: Additional studies/ records that were reviewed today include: Dr Fabio Bering notes  Review of the above records today demonstrates: as above   ASSESSMENT AND PLAN:  1.  Palpitations The patient has symptomatic palpitations of unclear etiology.  He has significant atriopathy and there is concern for afib as the case.  There is also a h/o prior stroke.  Dr Nicholas Emms feels that further evaluation for atrial fibrillation is prudent.  He has worn telemetry previously during his hospitalization which was unrevealing.  Risks and benefits to ILR were discussed with patient and family today.  They understand risks and wish to proceed.  We will therefore proceed with ILR placement at the next available time.  Current medicines are reviewed at length with the patient today.   The patient does not have concerns regarding his medicines.  The following changes were made today:  none  Signed, Nicholas Range, MD  09/15/2016      [facility] [address] [place] [address] [phone] (office) [phone] (fax)

## 2016-09-16 NOTE — Progress Notes (Signed)
Pt given discharge and dressing instructions per Dr Johney FrameAllred and pt was able to repeat these back to me and I was also told by  [facility] that he repeated them back to her.  Dressing clean dry and intact. Pt denies discomfort and states understanding of device.  Discharged ambulatory with son.

## 2016-09-16 NOTE — Interval H&P Note (Signed)
History and Physical Interval Note:  09/16/2016 7:31 AM  Lucendia HerrlichAllen Sherpa  has presented today for surgery, with the diagnosis of stroke  The various methods of treatment have been discussed with the patient and family. After consideration of risks, benefits and other options for treatment, the patient has consented to  Procedure(s): Loop Recorder Insertion (N/A) as a surgical intervention .  The patient's history has been reviewed, patient examined, no change in status, stable for surgery.  I have reviewed the patient's chart and labs.  Questions were answered to the patient's satisfaction.     Hillis RangeJames Gordie Crumby

## 2016-09-17 ENCOUNTER — Ambulatory Visit: Payer: Medicare Other | Admitting: Internal Medicine

## 2016-09-20 ENCOUNTER — Telehealth: Payer: Self-pay | Admitting: Neurology

## 2016-09-20 NOTE — Telephone Encounter (Signed)
Patient's daughter calling stating patient was seen in ED on 09-09-16 with breakthrough seizure. The hospital increased his dosage of Keppra from 500mg  twice a day to levETIRAcetam (KEPPRA) 750 MG tablet twice a day. He has an appointment in May with Megan. Does he need to be seen before May or can a Rx for Keppra 750mg  be [facility] to [facility] on [address]. He is almost out of this medication. Please call and advise.

## 2016-09-21 DIAGNOSIS — E559 Vitamin D deficiency, unspecified: Secondary | ICD-10-CM | POA: Diagnosis not present

## 2016-09-21 DIAGNOSIS — M1A0711 Idiopathic chronic gout, right ankle and foot, with tophus (tophi): Secondary | ICD-10-CM | POA: Diagnosis not present

## 2016-09-21 DIAGNOSIS — R7303 Prediabetes: Secondary | ICD-10-CM | POA: Diagnosis not present

## 2016-09-21 DIAGNOSIS — E782 Mixed hyperlipidemia: Secondary | ICD-10-CM | POA: Diagnosis not present

## 2016-09-22 ENCOUNTER — Other Ambulatory Visit: Payer: Self-pay | Admitting: Neurology

## 2016-09-22 MED ORDER — LEVETIRACETAM 750 MG PO TABS: 750.0000 mg | ORAL_TABLET | Freq: Two times a day (BID) | ORAL | 5 refills | Status: DC

## 2016-09-22 NOTE — Addendum Note (Signed)
Addended by: Donnelly AngelicaHOGAN, JENNIFER L on: 09/22/2016 04:11 PM   Modules accepted: Orders

## 2016-09-22 NOTE — Telephone Encounter (Signed)
Returned call and spoke to pt's daughter. He was seen in ED on 09/09/16 for seizure. CT head was negative. Keppra was increased to 750 mg BID. Daughter reports that he has been fine since leaving hospital and is doing well on higher dose of medication. However, he will need additional refills at the beginning of the month. Currently has a 4 month f/u appt scheduled w/ Megan NP in May.

## 2016-09-22 NOTE — Telephone Encounter (Signed)
Perfectly fine to order this for him thank you !

## 2016-09-22 NOTE — Addendum Note (Signed)
Addended by: Donnelly AngelicaHOGAN, JENNIFER L on: 09/22/2016 06:08 PM   Modules accepted: Orders

## 2016-09-29 ENCOUNTER — Ambulatory Visit (INDEPENDENT_AMBULATORY_CARE_PROVIDER_SITE_OTHER): Payer: Medicare Other | Admitting: *Deleted

## 2016-09-29 DIAGNOSIS — R002 Palpitations: Secondary | ICD-10-CM

## 2016-09-29 LAB — CUP PACEART INCLINIC DEVICE CHECK
Date Time Interrogation Session: 20180221120325
MDC IDC PG IMPLANT DT: 20180208

## 2016-09-29 NOTE — Progress Notes (Signed)
Wound check in clinic s/p ILR implant. Steri strips removed prior to appt. Wound well healed without redness or edema. Incision edges approximated. Normal ILR device function. Battery status: GOOD. R-waves 0.1467mV. 0 symptom episodes, 0 tachy episodes, 0 pause episodes, 0 brady episodes. 0 AF episodes (0% burden). Monthly summary reports and ROV with JA in 3 months.

## 2016-10-18 ENCOUNTER — Ambulatory Visit (INDEPENDENT_AMBULATORY_CARE_PROVIDER_SITE_OTHER): Payer: Medicare Other | Admitting: *Deleted

## 2016-10-18 DIAGNOSIS — R002 Palpitations: Secondary | ICD-10-CM

## 2016-10-19 NOTE — Progress Notes (Signed)
Carelink Summary Report / Loop Recorder 

## 2016-10-24 LAB — CUP PACEART REMOTE DEVICE CHECK
Date Time Interrogation Session: 20180310150532
Implantable Pulse Generator Implant Date: 20180208

## 2016-10-24 NOTE — Progress Notes (Signed)
Carelink summary report received. Battery status OK. Normal device function. No new symptom episodes, tachy episodes, brady, or pause episodes. No new AF episodes. Monthly summary reports and ROV/PRN 

## 2016-11-09 ENCOUNTER — Telehealth: Payer: Self-pay | Admitting: Neurology

## 2016-11-09 NOTE — Telephone Encounter (Signed)
Reported spells of slurred speech could due to partial seizure versus syncope/presyncope, EEG was normal in December 2017,  Please contact his cardiologist's office who manages his loop recorder to see if there is any abnormality documented is loop recorder, if there is not, may consider further increase of his Keppra to  in the morning/2 tablets of 750 at night.

## 2016-11-09 NOTE — Telephone Encounter (Signed)
Pt daughter called because her father is seen for partial seizures since December, daughter said he had one last night (only affects his speech) and wanted Dr Lucia Gaskins made aware, I told her Dr Lucia Gaskins is out of office this week but that I could message the nurse, she will keep appointment next month with [facility] and asking to be called with suggestions.  She said she checked on her dad this morning and he seemed fine on her way to see him this morning.

## 2016-11-09 NOTE — Telephone Encounter (Signed)
Pt hospitalized 08/02/17 and was started on Trileptal d/t dx of complex partial seizures. Then seen by Dr. Lucia Gaskins as a new patient on 08/13/16 for TIA vs seizure and AED was changed to Keppra  BID. Returned to ED on 09/09/16 w/ slurred speech. CT/CTA performed and Keppra was increased to 750 mg BID. Pt had loop recorder placed on 09/16/16. Daughter reports another episode of slurred speech last night. Says that he seems fine this morning. He has cardiology appt scheduled 11/15/16 and follow-up w/ Megan 12/27/16.

## 2016-11-09 NOTE — Telephone Encounter (Signed)
Carelink Summary Report / Loop Recorder    Consuelo Pandy at 10/18/2016 10:55 AM   Status: Signed    Carelink summary report received. Battery status OK. Normal device function. No new symptom episodes, tachy episodes, brady, or pause episodes. No new AF episodes. Monthly summary reports and ROV/PRN     Returned call to daughter but no answer. Left VM mssg to call back tomorrow.

## 2016-11-10 NOTE — Telephone Encounter (Signed)
Please increase her Keppra to 750 mg 1 tablets in the morning, 2 tablets in the evening

## 2016-11-10 NOTE — Telephone Encounter (Signed)
Called and spoke to daughter. She reported that pt missed his morning dose of Keppra the day that he had episode of slurred speech. He did remember to take it later in the day (around 2 pm). Daughter noticed s/s of seizure around 8 pm. Feels that Keppra causes him to be more sleepy and would like to hold off on increasing dosage at this time. Did agree to increase to 2 tabs at night if pt has any additional episodes while he is taking med twice a day as scheduled.

## 2016-11-15 ENCOUNTER — Ambulatory Visit (INDEPENDENT_AMBULATORY_CARE_PROVIDER_SITE_OTHER): Payer: Medicare Other | Admitting: *Deleted

## 2016-11-15 DIAGNOSIS — R002 Palpitations: Secondary | ICD-10-CM

## 2016-11-16 NOTE — Progress Notes (Signed)
Carelink Summary Report / Loop Recorder 

## 2016-11-21 LAB — CUP PACEART REMOTE DEVICE CHECK
Implantable Pulse Generator Implant Date: 20180208
MDC IDC SESS DTM: 20180409153856

## 2016-11-21 NOTE — Progress Notes (Signed)
Carelink summary report received. Battery status OK. Normal device function. No new symptom episodes, tachy episodes, brady, or pause episodes. No new AF episodes. Monthly summary reports and ROV/PRN 

## 2016-12-13 ENCOUNTER — Encounter: Payer: Self-pay | Admitting: *Deleted

## 2016-12-15 ENCOUNTER — Ambulatory Visit (INDEPENDENT_AMBULATORY_CARE_PROVIDER_SITE_OTHER): Payer: Medicare Other | Admitting: *Deleted

## 2016-12-15 DIAGNOSIS — R002 Palpitations: Secondary | ICD-10-CM | POA: Diagnosis not present

## 2016-12-16 NOTE — Progress Notes (Signed)
Carelink Summary Report / Loop Recorder 

## 2016-12-24 LAB — CUP PACEART REMOTE DEVICE CHECK
Implantable Pulse Generator Implant Date: 20180208
MDC IDC SESS DTM: 20180509222016

## 2016-12-27 ENCOUNTER — Encounter: Payer: Self-pay | Admitting: Adult Health

## 2016-12-27 ENCOUNTER — Ambulatory Visit (INDEPENDENT_AMBULATORY_CARE_PROVIDER_SITE_OTHER): Payer: Medicare Other | Admitting: Adult Health

## 2016-12-27 VITALS — BP 119/78 | HR 75 | Ht 67.0 in | Wt 162.2 lb

## 2016-12-27 DIAGNOSIS — R569 Unspecified convulsions: Secondary | ICD-10-CM | POA: Diagnosis not present

## 2016-12-27 NOTE — Patient Instructions (Addendum)
Continue Keppra 750 mg twice a day If you have any seizure events please let us know Do not drive,  operate heavy machinery, perform activities at heights or participate in water activities until 6 months seizure free Do not operate heavy machinery or

## 2016-12-27 NOTE — Progress Notes (Signed)
PATIENT: Nicholas Gaines DOB: 10-24-1928  REASON FOR VISIT: follow up- seizures HISTORY FROM: patient  HISTORY OF PRESENT ILLNESS: Nicholas Gaines is an 83 year old male with a history of seizures. Nicholas Gaines returns today for follow-up. Nicholas Gaines reports that Nicholas Gaines has not had any additional seizures since Nicholas Gaines called our office at the beginning of April. Nicholas Gaines continues to take Keppra 750 mg twice a day. Nicholas Gaines lives at home alone. Nicholas Gaines is able to complete all ADLs independently. His daughter states that Nicholas Gaines is managing a garden. The patient is operating a motor vehicle. Nicholas Gaines reports that Nicholas Gaines is tolerating the medication although Nicholas Gaines does notice it makes him more sleepy throughout the day. Nicholas Gaines denies any new neurological symptoms. Nicholas Gaines returns today for an evaluation.  HISTORY 08/13/16: Nicholas Gaines is a 83 y.o. male here as a referral from Dr. Azucena Cecil for TIA vs seizures. Past medical history of atrial fibrillation, heart murmur, stroke in 2002, esophageal stricture, hyperlipidemia, TIA, prediabetic. Nicholas Gaines is on aspirin 325 and Plavix 75. Patient has had several episodes of staring, confusion, disorientation and unintelligible speech. Usually back to baseline within a half hour. No distinct recollection of the events and only slightly before and after them. No weakness or numbness or incoordination or gait disturbances. No headaches. Episodes happened when Nicholas Gaines was feeling well but also in the setting of fevers and cough and respiratory infection. The first episode was in November, Nicholas Gaines was fishing and Nicholas Gaines tried to talk and couldn't get his words out, lasted 15 minutes, Nicholas Gaines could talk when Nicholas Gaines got home. Daughter is here and provides much information, she saw him later that evening and it was fine. Nicholas Gaines denies any other associated symptoms such as weakness or confusion or vision changes. His friend in the car noticed that there was something wrong, no LOC, no urination or abnormal movements, Nicholas Gaines was in his usual state of health at the time no new  medications or anything new but Nicholas Gaines was starting to feel ill at the time. Next episode was on Christmas eve in the setting of illness, daughter was calling him and Nicholas Gaines did not answer and Nicholas Gaines was sitting in the car with his wet clothes on from vomiting and Nicholas Gaines was asleep in the car, Nicholas Gaines was weak, Nicholas Gaines was running a fever of 102 and Nicholas Gaines was not taken to the hospital. On Christmas day Nicholas Gaines didn't recognize family, Nicholas Gaines was speaking in "word salad" and Nicholas Gaines was aimlessly wandering around the house and looking into space and not saying anything, couldn;t tell his daughter's name. No events since being discharged from the hospital. No current focal neurologic deficits, other associated symptoms or modifiable factors. Nicholas Gaines has not had any more incidents since starting AEDs, was started on trileptal in the hospital.   Reviewed notes, labs and imaging from outside physicians, which showed:   LDL 69, BUN 19, creatinine 1.1, A1c 5.6 08/03/2016. TSH 1.9 11/14/2015.  Patient presented to the emergency room on 07/03/2016 with fever, cough and episodic confusion. Nicholas Gaines said 2 episodes in the last several days where Nicholas Gaines had become discretely confused. Patient had a TIA in November diagnosed by primary care. Nicholas Gaines had been having on and off fevers, shortness of breath, cough and was recently started on antibiotics. Patient was unable to speak during his TIA, Nicholas Gaines remembered trying to speak and not being able to. Nicholas Gaines had a outpatient workup for TIA with his PCP that showed MRI with old stroke in 2002 and is getting loose recorder for paroxysmal  A. fib. Episodes of confusion were described as disorientation and staring with associated difficulty speaking. Episodes lasted 30-60 minutes then resolved spontaneously. Patient did not have any weakness, numbness, vision or hearing loss. Episodes of happened in the setting of temperature and cough but also happen when patient was feeling well. On admission Nicholas Gaines was found to have elevated white blood cells 13,  CT of the head was negative for acute intracranial abnormalities and neurology was consulted. Neurology felt that these were suggestive of complex partial seizures and not TIAs. Routine EEG was recommended for outpatient 72 hour ambulatory EEG.  Personally reviewed images and agree with the following: MRI brain 06/22/2016: 1. No acute intracranial abnormality. 2. Mild chronic microvascular ischemic changes and parenchymal volume loss for age. Few scattered punctate foci of chronic microhemorrhage are nonspecific but also likely related to microvascular ischemic changes  REVIEW OF SYSTEMS: Out of a complete 14 system review of symptoms, the patient complains only of the following symptoms, and all other reviewed systems are negative.  Urgency, seizure, weakness, joint pain, back pain, confusion, decreased concentration, daytime sleepiness, drooling, fatigue, appetite change  ALLERGIES: Allergies  Allergen Reactions  . Rocephin [Ceftriaxone Sodium In Dextrose] Other (See Comments)    C-Diff  . Atorvastatin     Other reaction(s): myalgia  . Pravastatin     Other reaction(s): myalgia  . Rosuvastatin Calcium     Other reaction(s): fatigued    HOME MEDICATIONS: Outpatient Medications Prior to Visit  Medication Sig Dispense Refill  . aspirin 325 MG tablet Take 325 mg by mouth daily.    . Cholecalciferol 1000 units capsule Take 4,000 Units by mouth daily.     . clopidogrel (PLAVIX) 75 MG tablet Take 75 mg by mouth daily.   0  . ferrous sulfate 325 (65 FE) MG EC tablet Take 325 mg by mouth daily with breakfast.    . ibuprofen (ADVIL,MOTRIN) 200 MG tablet Take 600 mg by mouth daily as needed for headache or moderate pain.     Marland Kitchen levETIRAcetam (KEPPRA) 750 MG tablet Take 1 tablet (750 mg total) by mouth 2 (two) times daily. 60 tablet 5  . LYCOPENE PO Take 10 mg by mouth daily.     . Multiple Vitamins-Minerals (ICAPS) CAPS Take 1 capsule by mouth daily.    . Omega-3 Fatty Acids (FISH OIL) 1000  MG CAPS Take 2,000 mg by mouth daily.    . ranitidine (ZANTAC) 150 MG tablet Take 300 mg by mouth daily.   0  . Saw Palmetto 450 MG CAPS Take 900 mg by mouth daily.     . vitamin B-12 (CYANOCOBALAMIN) 1000 MCG tablet Take 1,000 mcg by mouth daily.    Marland Kitchen ZETIA 10 MG tablet Take 10 mg by mouth daily.   0   No facility-administered medications prior to visit.     PAST MEDICAL HISTORY: Past Medical History:  Diagnosis Date  . A-fib (HCC)   . Arthritis   . GERD (gastroesophageal reflux disease)   . Heart murmur   . HLD (hyperlipidemia)   . Stroke [facility])    tia    PAST SURGICAL HISTORY: Past Surgical History:  Procedure Laterality Date  . CARPAL TUNNEL RELEASE     About 10 yr ago (08/13/16)  . LOOP RECORDER INSERTION N/A 09/16/2016   Procedure: Loop Recorder Insertion;  Surgeon: Hillis Range, MD;  Location: [facility] INVASIVE CV LAB;  Service: Cardiovascular;  Laterality: N/A;  . TONSILLECTOMY      FAMILY HISTORY: Family  History  Problem Relation Age of Onset  . Hypertension Mother   . Parkinson's disease Mother   . Heart attack Mother   . Stroke Sister   . Lymphoma Sister   . Stroke Brother   . CVA Father   . Stroke Paternal Grandmother        in her early 55's  . Stroke Paternal Grandfather        in his early 74's  . Diabetes Neg Hx     SOCIAL HISTORY: Social History   Social History  . Marital status: Widowed    Spouse name: N/A  . Number of children: 1  . Years of education: 44   Occupational History  . Retired    Social History Main Topics  . Smoking status: Never Smoker  . Smokeless tobacco: Never Used  . Alcohol use No  . Drug use: No  . Sexual activity: Not on file   Other Topics Concern  . Not on file   Social History Narrative   Lives alone   Caffeine use: none      PHYSICAL EXAM  Vitals:   12/27/16 1309  BP: 119/78  Pulse: 75  Weight: 162 lb 3.2 oz (73.6 kg)  Height: 5\' 7"  (1.702 m)   Body mass index is 25.4 kg/m.  Generalized: Well  developed, in no acute distress   Neurological examination  Mentation: Alert oriented to time, place, history taking. Follows all commands speech and language fluent Cranial nerve II-XII: Pupils were equal round reactive to light. Extraocular movements were full, visual field were full on confrontational test. Facial sensation and strength were normal. Uvula tongue midline. Head turning and shoulder shrug  were normal and symmetric. Motor: The motor testing reveals 5 over 5 strength of all 4 extremities. Good symmetric motor tone is noted throughout.  Sensory: Sensory testing is intact to soft touch on all 4 extremities. No evidence of extinction is noted.  Coordination: Cerebellar testing reveals good finger-nose-finger and heel-to-shin bilaterally.  Gait and station: Gait is normal. Tandem gait is normal. Romberg is negative. No drift is seen.  Reflexes: Deep tendon reflexes are symmetric and normal bilaterally.   DIAGNOSTIC DATA (LABS, IMAGING, TESTING) - I reviewed patient records, labs, notes, testing and imaging myself where available.  Lab Results  Component Value Date   WBC 6.3 09/09/2016   HGB 12.2 (L) 09/09/2016   HCT 37.6 (L) 09/09/2016   MCV 91.0 09/09/2016   PLT 179 09/09/2016      Component Value Date/Time   NA 142 09/09/2016 1108   NA 137 08/13/2016 1241   K 4.0 09/09/2016 1108   CL 106 09/09/2016 1108   CO2 27 09/09/2016 1108   GLUCOSE 105 (H) 09/09/2016 1108   BUN 14 09/09/2016 1108   BUN 16 08/13/2016 1241   CREATININE 1.10 09/09/2016 1108   CALCIUM 9.5 09/09/2016 1108   PROT 6.9 09/09/2016 1108   PROT 6.9 08/13/2016 1241   ALBUMIN 4.0 09/09/2016 1108   ALBUMIN 4.1 08/13/2016 1241   AST 23 09/09/2016 1108   ALT 14 (L) 09/09/2016 1108   ALKPHOS 93 09/09/2016 1108   BILITOT 0.6 09/09/2016 1108   BILITOT 0.4 08/13/2016 1241   GFRNONAA 59 (L) 09/09/2016 1108   GFRAA >60 09/09/2016 1108   Lab Results  Component Value Date   CHOL 120 08/03/2016   HDL 37  (L) 08/03/2016   LDLCALC 69 08/03/2016   TRIG 71 08/03/2016   CHOLHDL 3.2 08/03/2016   Lab Results  Component Value Date   HGBA1C 5.6 08/03/2016      ASSESSMENT AND PLAN 83 y.o. year old male  has a past medical history of A-fib (HCC); Arthritis; GERD (gastroesophageal reflux disease); Heart murmur; HLD (hyperlipidemia); and Stroke (HCC). here with:  1. Seizures  Overall the patient is doing well. Nicholas Gaines will continue on Keppra 750 mg twice a day. We did discuss switching to extended release to see if this will help with his sleepiness however Nicholas Gaines deferred at this time. I advised that Nicholas Gaines should not operate a motor vehicle. Patient voiced understanding. Also explained that Nicholas G[facility] should not [facility] or participate in water activities until Nicholas Gaines is seizure-free for 6 months. Patient voiced understanding. I did refer the patient and his daughter to the [facility] for transportation until Nicholas Gaines is able to drive. I explained to the patient that if Nicholas Gaines has any additional seizure events Nicholas Gaines should let us know. Nicholas Gaines will follow-up in 6 months or sooner if needed.   I spent 15 minutes with the patient 50% of this time was spent reviewing seizure precautions.    Butch PennyMegan Odis Turck, MSN, NP-C 12/27/2016, 1:29 PM [facility] [address], [address] [place], [place] [id number] [phone]

## 2016-12-29 ENCOUNTER — Ambulatory Visit (INDEPENDENT_AMBULATORY_CARE_PROVIDER_SITE_OTHER): Payer: Medicare Other | Admitting: Internal Medicine

## 2016-12-29 ENCOUNTER — Encounter: Payer: Self-pay | Admitting: Internal Medicine

## 2016-12-29 ENCOUNTER — Encounter: Payer: Medicare Other | Admitting: Internal Medicine

## 2016-12-29 VITALS — BP 118/84 | HR 69 | Ht 66.0 in | Wt 159.4 lb

## 2016-12-29 DIAGNOSIS — R002 Palpitations: Secondary | ICD-10-CM | POA: Diagnosis not present

## 2016-12-29 DIAGNOSIS — Z8673 Personal history of transient ischemic attack (TIA), and cerebral infarction without residual deficits: Secondary | ICD-10-CM

## 2016-12-29 LAB — CUP PACEART INCLINIC DEVICE CHECK
Implantable Pulse Generator Implant Date: 20180208
MDC IDC SESS DTM: 20180523111551

## 2016-12-29 NOTE — Progress Notes (Signed)
PCP: Tally JoeSwayne, David, MD Primary Cardiologist:  Nicholas Gaines is a 83 y.o. male who presents today for routine electrophysiology followup.  Since last being seen in our clinic, the patient reports doing very well.  Today, he denies symptoms of palpitations, chest pain, shortness of breath,  lower extremity edema, dizziness, presyncope, or syncope.  The patient is otherwise without complaint today.   Past Medical History:  Diagnosis Date  . A-fib (HCC)   . Arthritis   . GERD (gastroesophageal reflux disease)   . Heart murmur   . HLD (hyperlipidemia)   . Stroke [facility](HCC)    tia   Past Surgical History:  Procedure Laterality Date  . CARPAL TUNNEL RELEASE     About 10 yr ago (08/13/16)  . LOOP RECORDER INSERTION N/A 09/16/2016   Procedure: Loop Recorder Insertion;  Surgeon: Hillis RangeJames Yuepheng Schaller, MD;  Location: [facility] INVASIVE CV LAB;  Service: Cardiovascular;  Laterality: N/A;  . TONSILLECTOMY      ROS- all systems are reviewed and negatives except as per HPI above  Current Outpatient Prescriptions  Medication Sig Dispense Refill  . aspirin 325 MG tablet Take 325 mg by mouth daily.    . Cholecalciferol 1000 units capsule Take 4,000 Units by mouth daily.     . clopidogrel (PLAVIX) 75 MG tablet Take 75 mg by mouth daily.   0  . ferrous sulfate 325 (65 FE) MG EC tablet Take 325 mg by mouth daily with breakfast.    . ibuprofen (ADVIL,MOTRIN) 200 MG tablet Take 600 mg by mouth daily as needed for headache or moderate pain.     Marland Kitchen. levETIRAcetam (KEPPRA) 750 MG tablet Take 1 tablet (750 mg total) by mouth 2 (two) times daily. 60 tablet 5  . LYCOPENE PO Take 10 mg by mouth daily.     . Multiple Vitamins-Minerals (ICAPS) CAPS Take 1 capsule by mouth daily.    . Omega-3 Fatty Acids (FISH OIL) 1000 MG CAPS Take 2,000 mg by mouth daily.    . ranitidine (ZANTAC) 150 MG tablet Take 300 mg by mouth daily.   0  . Saw Palmetto 450 MG CAPS Take 900 mg by mouth daily.     . vitamin B-12 (CYANOCOBALAMIN) 1000 MCG tablet  Take 1,000 mcg by mouth daily.    Marland Kitchen. ZETIA 10 MG tablet Take 10 mg by mouth daily.   0   No current facility-administered medications for this visit.     Physical Exam: Vitals:   12/29/16 0958  BP: 118/84  Pulse: 69  SpO2: 97%  Weight: 159 lb 6.4 oz (72.3 kg)  Height: 5\' 6"  (1.676 m)    GEN- The patient is well appearing, alert and oriented x 3 today.   Head- normocephalic, atraumatic Eyes-  Sclera clear, conjunctiva pink Ears- hearing intact Oropharynx- clear Lungs- Clear to ausculation bilaterally, normal work of breathing Heart- Regular rate and rhythm, no murmurs, rubs or gallops, PMI not laterally displaced GI- soft, NT, ND, + BS Extremities- no clubbing, cyanosis, or edema   Assessment and Plan:  1. Palpitations, prior stroke Doing well with ILR monitoring (monitor reviewed today, no arrhythmias, see PaceArt) Will follow remotely  Return to see EP PA in a year Follow-up with Dr Eden EmmsNishan as scheduled  Hillis RangeJames Andrew Blasius MD, [facility] Center[facility] 12/29/2016 10:32 AM

## 2016-12-29 NOTE — Patient Instructions (Addendum)
Medication Instructions:  Your physician recommends that you continue on your current medications as directed. Please refer to the Current Medication list given to you today.   Labwork: None ordered   Testing/Procedures: None ordered   Follow-Up: Your physician wants you to follow-up in: 12 months with Renee Ursuy, PA You will receive a reminder letter in the mail two months in advance. If you don't receive a letter, please call our office to schedule the follow-up appointment.       Any Other Special Instructions Will Be Listed Below (If Applicable).     If you need a refill on your cardiac medications before your next appointment, please call your pharmacy.   

## 2017-01-04 NOTE — Progress Notes (Signed)
Personally  participated in, made any corrections needed, and agree with history, physical, neuro exam,assessment and plan as stated above.    Radwan Cowley, MD [facility] 

## 2017-01-14 ENCOUNTER — Ambulatory Visit (INDEPENDENT_AMBULATORY_CARE_PROVIDER_SITE_OTHER): Payer: Medicare Other | Admitting: *Deleted

## 2017-01-14 DIAGNOSIS — R002 Palpitations: Secondary | ICD-10-CM

## 2017-01-14 NOTE — Progress Notes (Signed)
Carelink Summary Report / Loop Recorder 

## 2017-01-25 LAB — CUP PACEART REMOTE DEVICE CHECK
Date Time Interrogation Session: 20180608160909
MDC IDC PG IMPLANT DT: 20180208

## 2017-02-14 ENCOUNTER — Ambulatory Visit (INDEPENDENT_AMBULATORY_CARE_PROVIDER_SITE_OTHER): Payer: Medicare Other | Admitting: *Deleted

## 2017-02-14 DIAGNOSIS — R002 Palpitations: Secondary | ICD-10-CM

## 2017-02-15 NOTE — Progress Notes (Signed)
Carelink Summary Report / Loop Recorder 

## 2017-03-07 LAB — CUP PACEART REMOTE DEVICE CHECK
Date Time Interrogation Session: 20180708163905
Implantable Pulse Generator Implant Date: 20180208

## 2017-03-15 ENCOUNTER — Ambulatory Visit (INDEPENDENT_AMBULATORY_CARE_PROVIDER_SITE_OTHER): Payer: Medicare Other | Admitting: *Deleted

## 2017-03-15 DIAGNOSIS — R002 Palpitations: Secondary | ICD-10-CM | POA: Diagnosis not present

## 2017-03-16 NOTE — Progress Notes (Signed)
Carelink Summary Report / Loop Recorder 

## 2017-03-17 IMAGING — CT CT ANGIO HEAD
1 of 10 series · 1 of 33 positions shown · IV contrast (Iohexol (Omnipaque 350))
Comparison: Noncontrast head CT earlier today and MRI 06/22/2016.
No prior angiographic imaging available.

CLINICAL DATA: Confusion with intermittent speech difficulties.

EXAM:
CT ANGIOGRAPHY HEAD
TECHNIQUE: Multidetector CT imaging of the head was performed using the
standard protocol during bolus administration of intravenous
contrast. Multiplanar CT image reconstructions and MIPs were
obtained to evaluate the vascular anatomy.
CONTRAST:  50 mL Isovue 370

[Series 200: locator · axial · 0.49mm/px · 1 of 1 slices shown]
[im 1/1  soft-tissue]
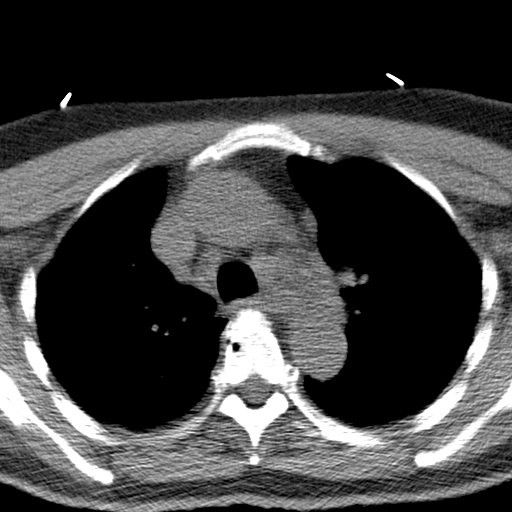

[1 of 33 positions shown; findings below may reference images not displayed]

FINDINGS: Anterior circulation: The internal carotid arteries are patent from
skullbase to carotid termini with right greater than left cavernous
segment ectasia. There is mild siphon calcified atherosclerosis
bilaterally without significant stenosis. ACAs and MCAs are patent
without evidence of major branch occlusion or significant stenosis.
The left A1 segment is hypoplastic. An anterior communicating
aneurysm measures 4 x 4 mm.

Posterior circulation: The visualized distal vertebral arteries are
widely patent to the basilar and codominant. PICA and SCA origins
are patent. Basilar artery is widely patent. Posterior communicating
arteries are not identified. PCAs are patent without evidence of
significant stenosis. No aneurysm.

Venous sinuses: Patent.

Anatomic variants: Hypoplastic left A1.

Delayed phase: No abnormal enhancement.
IMPRESSION: 1. Mild intracranial atherosclerosis without major vessel occlusion
or significant stenosis.
2. 4 mm anterior communicating aneurysm.

## 2017-03-23 ENCOUNTER — Other Ambulatory Visit: Payer: Self-pay | Admitting: Internal Medicine

## 2017-03-23 LAB — CUP PACEART REMOTE DEVICE CHECK
Date Time Interrogation Session: 20180807173827
Implantable Pulse Generator Implant Date: 20180208

## 2017-03-24 DIAGNOSIS — R7303 Prediabetes: Secondary | ICD-10-CM | POA: Diagnosis not present

## 2017-03-24 DIAGNOSIS — E559 Vitamin D deficiency, unspecified: Secondary | ICD-10-CM | POA: Diagnosis not present

## 2017-03-24 DIAGNOSIS — M1A0711 Idiopathic chronic gout, right ankle and foot, with tophus (tophi): Secondary | ICD-10-CM | POA: Diagnosis not present

## 2017-03-24 DIAGNOSIS — E782 Mixed hyperlipidemia: Secondary | ICD-10-CM | POA: Diagnosis not present

## 2017-03-30 DIAGNOSIS — H524 Presbyopia: Secondary | ICD-10-CM | POA: Diagnosis not present

## 2017-04-08 ENCOUNTER — Other Ambulatory Visit: Payer: Self-pay | Admitting: Neurology

## 2017-04-14 ENCOUNTER — Ambulatory Visit (INDEPENDENT_AMBULATORY_CARE_PROVIDER_SITE_OTHER): Payer: Medicare Other | Admitting: *Deleted

## 2017-04-14 ENCOUNTER — Telehealth: Payer: Self-pay | Admitting: *Deleted

## 2017-04-14 DIAGNOSIS — R002 Palpitations: Secondary | ICD-10-CM | POA: Diagnosis not present

## 2017-04-14 NOTE — Telephone Encounter (Signed)
Spoke with patient, requested manual Carelink transmission for review.  Patient is not home right now and will plan to call back for assistance.  He is aware of the direct Device Clinic phone number and denies additional questions or concerns at this time.

## 2017-04-14 NOTE — Telephone Encounter (Signed)
Attempted to help pt send manual transmission. Transmission was unsuccessful. Instructed pt to call tech support. Pt called back and stated that tech support told him to take monitor to the office to get help sending transmission. Patient agreed to an appt on Monday 04-18-2017 at 10:00 AM

## 2017-04-15 NOTE — Progress Notes (Signed)
Carelink Summary Report / Loop Recorder 

## 2017-04-18 ENCOUNTER — Ambulatory Visit (INDEPENDENT_AMBULATORY_CARE_PROVIDER_SITE_OTHER): Payer: Self-pay | Admitting: *Deleted

## 2017-04-18 ENCOUNTER — Telehealth: Payer: Self-pay | Admitting: Adult Health

## 2017-04-18 DIAGNOSIS — I639 Cerebral infarction, unspecified: Secondary | ICD-10-CM

## 2017-04-18 LAB — CUP PACEART INCLINIC DEVICE CHECK
MDC IDC PG IMPLANT DT: 20180208
MDC IDC SESS DTM: 20180910111910

## 2017-04-18 NOTE — Telephone Encounter (Signed)
Since he had a seizure- my recommendation would be to increase medication or consider adding on another medication. Since he had a seizure Patient is unable to drive, operate heavy machinery, perform activities at heights or participate in water activities until 6 months seizure free.   Also, were there any other reasons for seizure- sleep deprivation, sick, pill color/shape change?

## 2017-04-18 NOTE — Telephone Encounter (Addendum)
Spoke with [name] who stated the patient was with friends at church when his seizure occurred. They took him home, and he "came out of it". She stated that he is reliable with taking his medications, so she is sure he didn't miss a dose. She stated the family is not wanting Keppra to be increased due to his age and fatigue. This RN advised will let NP know; patient has FU in Nov. [name] verbalized understanding, appreciation.

## 2017-04-18 NOTE — Telephone Encounter (Signed)
Patients daughter Elita Quickam (listed on DPR) called office to notify office that patient had a break thru seizure last night lasting maybe 30 minutes.  Per daughter the seizure cleared up quickly and patient is doing fine.  If any questions please contact daughter Elita Quickam.

## 2017-04-18 NOTE — Telephone Encounter (Signed)
Called Nicholas Gaines and advised her of NP's advise for patient. Inquired about how he's been sleeping, and she stated he naps during the day. She stated she doesn't think his medication changed but will ask him. She advised that in order to make medication changes he will want to see NP. Scheduled for FU tomorrow; Elita Quickam will bring him. She verbalized appreciation of call back.

## 2017-04-18 NOTE — Progress Notes (Signed)
Loop check in clinic, added-on as patient was unsuccessful in sending a manual Carelink transmission for review of "AF" episode. Battery status: good. R-waves 0.5352mV. No symptom, tachy, pause, or brady episodes. 1 AF episode--reviewed with JA, appears to be SR w/PACs, artifact limits interpretation, cannot fully exclude AF. Plan to continue to monitor remotely. Monthly summary reports and ROV with JA PRN.

## 2017-04-19 ENCOUNTER — Encounter: Payer: Self-pay | Admitting: Adult Health

## 2017-04-19 ENCOUNTER — Ambulatory Visit (INDEPENDENT_AMBULATORY_CARE_PROVIDER_SITE_OTHER): Payer: Medicare Other | Admitting: Adult Health

## 2017-04-19 VITALS — BP 126/73 | HR 64 | Wt 155.8 lb

## 2017-04-19 DIAGNOSIS — R569 Unspecified convulsions: Secondary | ICD-10-CM

## 2017-04-19 MED ORDER — LACOSAMIDE 50 MG PO TABS: 50.0000 mg | ORAL_TABLET | Freq: Two times a day (BID) | ORAL | 5 refills | Status: DC

## 2017-04-19 NOTE — Patient Instructions (Addendum)
Your Plan:  Continue current dose of Keppra 750 mg twice a day Start Vimpat 50 mg twice a day- if tolerating call in two weeks to increase dose If your symptoms worsen or you develop new symptoms please let us know.  You should not drive, operate heavy machinery, perform activities at heights or participate in water activities until 6 months seizure free  Thank you for coming to see [place] at [facility]. I hope we have been able to provide you high quality care today.  You may receive a patient satisfaction survey over the next few weeks. We would appreciate your feedback and comments so that we may continue to improve ourselves and the health of our patients.  Lacosamide tablets What is this medicine? LACOSAMIDE (la KOE sa mide) is used to control seizures caused by certain types of epilepsy. This medicine may be used for other purposes; ask your health care provider or pharmacist if you have questions. COMMON BRAND NAME(S): Vimpat What should I tell my health care provider before I take this medicine? They need to know if you have any of these conditions: -dehydration -heart disease, including heart failure -history of a drug or alcohol abuse problem -kidney disease -liver disease -suicidal thoughts, plans, or attempt; a previous suicide attempt by you or a family member -an unusual or allergic reaction to lacosamide, other medicines, foods, dyes, or preservatives -pregnant or trying to get pregnant -breast-feeding How should I use this medicine? Take this medicine by mouth with a glass of water. You can take it with or without food. Follow the directions on the prescription label. Take your doses at regular intervals. Do not take your medicine more often than directed. Do not stop taking except on the advice of your doctor or health care professional. A special MedGuide will be given to you by the pharmacist with each prescription and refill. Be sure to read this  information carefully each time. Talk to your pediatrician regarding the use of this medicine in children. While this drug may be prescribed for children as young as 17 years of age for selected conditions, precautions do apply. Overdosage: If you think you have taken too much of this medicine contact a poison control center or emergency room at once. NOTE: This medicine is only for you. Do not share this medicine with others. What if I miss a dose? If you miss a dose, take it as soon as you can. If it is almost time for your next dose, take only that dose. Do not take double or extra doses. What may interact with this medicine? -atazanavir -beta-blockers like metoprolol and propranolol -calcium channel blockers like diltiazem and verapamil -digoxin -dronedarone -lopinavir/ritonavir This list may not describe all possible interactions. Give your health care provider a list of all the medicines, herbs, non-prescription drugs, or dietary supplements you use. Also tell them if you smoke, drink alcohol, or use illegal drugs. Some items may interact with your medicine. What should I watch for while using this medicine? Visit your doctor or health care professional for regular checks on your progress. This medicine needs careful monitoring. Wear a medical ID bracelet or chain, and carry a card that describes your disease and details of your medicine and dosage times. You may get drowsy or dizzy. Do not drive, use machinery, or do anything that needs mental alertness until you know how this medicine affects you. Do not stand or sit up quickly, especially if you are an older patient. This reduces  the risk of dizzy or fainting spells. Alcohol may interfere with the effect of this medicine. Avoid alcoholic drinks. The use of this medicine may increase the chance of suicidal thoughts or actions. Pay special attention to how you are responding while on this medicine. Any worsening of mood, or thoughts of  suicide or dying should be reported to your health care professional right away. Women who become pregnant while using this medicine may enroll in the [place] Antiepileptic Drug Pregnancy Registry by calling [phone]. This registry collects information about the safety of antiepileptic drug use during pregnancy. What side effects may I notice from receiving this medicine? Side effects that you should report to your doctor or health care professional as soon as possible: -allergic reactions like skin rash, itching or hives, swelling of the face, lips, or tongue -confusion -feeling faint or lightheaded, falls -fever -irregular heart beat -loss of memory -suicidal thoughts or other mood changes -unusually weak or tired -yellowing of the eyes, skin Side effects that usually do not require medical attention (report to your doctor or health care professional if they continue or are bothersome): -constipation -diarrhea -drowsiness -dry mouth -headache -nausea This list may not describe all possible side effects. Call your doctor for medical advice about side effects. You may report side effects to FDA at 1-800-FDA-1088. Where should I keep my medicine? Keep out of the reach of children. This medicine can be abused. Keep your medicine in a safe place to protect it from theft. Do not share this medicine with anyone. Selling or giving away this medicine is dangerous and against the law. This medicine may cause accidental overdose and death if it taken by other adults, children, or pets. Mix any unused medicine with a substance like cat litter or coffee grounds. Then throw the medicine away in a sealed container like a sealed bag or a coffee can with a lid. Do not use the medicine after the expiration date. Store at room temperature between 15 and 30 degrees C (59 and 86 degrees F). NOTE: This sheet is a summary. It may not cover all possible information. If you have questions about this  medicine, talk to your doctor, pharmacist, or health care provider.  2018 Elsevier/Gold Standard (2016-06-17 08:32:52)

## 2017-04-19 NOTE — Progress Notes (Signed)
PATIENT: Nicholas Gaines DOB: September 24, 1928  REASON FOR VISIT: follow up- seizures HISTORY FROM: patient  HISTORY OF PRESENT ILLNESS: Today 04/19/17 Nicholas Gaines is a 83 year old male with a history of seizures. He returns today for follow-up. The patient had a seizure on Sunday night after church. The patient reports that he had not missed any recent doses. He does state that he believes he may have missed a dose on Tuesday. The patient states that his seizure presented the same as it has in the past. He had difficulty speaking, was unable to get his words out. The people around him noted that his speech did not make sense. The patient's daughter states that this had to start after 7 PM and when she talked to him at 7:30 he was back to normal. The patient did not lose consciousness. Was not convulsing and extremities. In the past he has been on Trileptal. He is remains on Keppra 750 twice a day. He states that this makes him extremely drowsy. He states that the current dose he wants to sleep most of the day. He feels that this has disrupted his quality of life because of the drowsiness.  HISTORY 12/27/16: Nicholas Gaines is an 83 year old male with a history of seizures. He returns today for follow-up. He reports that he has not had any additional seizures since he called our office at the beginning of April. He continues to take Keppra 750 mg twice a day. He lives at home alone. He is able to complete all ADLs independently. His daughter states that he is managing a garden. The patient is operating a motor vehicle. He reports that he is tolerating the medication although he does notice it makes him more sleepy throughout the day. He denies any new neurological symptoms. He returns today for an evaluation.  HISTORY 08/13/16: Nicholas Busman Thompsonis a 83 y.o.malehere as a referral from Nicholas Gaines TIA vs seizures. Past medical history of atrial fibrillation, heart murmur, stroke in 2002, esophageal stricture,  hyperlipidemia, TIA, prediabetic. He is on aspirin 325 and Plavix 75. Patient has had several episodes of staring, confusion, disorientation and unintelligible speech. Usually back to baseline within a half hour. No distinct recollection of the events and only slightly before and after them. No weakness or numbness or incoordination or gait disturbances. No headaches. Episodes happened when he was feeling well but also in the setting of fevers and cough and respiratory infection. The first episode was in November, he was fishing and he tried to talk and couldn't get his words out, lasted 15 minutes, he could talk when he got home. Daughter is here and provides much information, she saw him later that evening and it was fine. He denies any other associated symptoms such as weakness or confusion or vision changes. His friend in the car noticed that there was something wrong, no LOC, no urination or abnormal movements, he was in his usual state of health at the time no new medications or anything new but he was starting to feel ill at the time. Next episode was on Christmas eve in the setting of illness, daughter was calling him and he did not answer and he was sitting in the car with his wet clothes on from vomiting and he was asleep in the car, he was weak, he was running a fever of 102 and he was not taken to the hospital. On Christmas day he didn't recognize family, he was speaking in "word salad" and he was aimlessly wandering  around the house and looking into space and not saying anything, couldn;t tell his daughter's name. No events since being discharged from the hospital. No current focal neurologic deficits, other associated symptoms or modifiable factors. He has not had any more incidents since starting AEDs, was started on trileptal in the hospital.   Reviewed notes, labs and imaging from outside physicians, which showed:   LDL 69, BUN 19, creatinine 1.1, A1c 5.6 08/03/2016. TSH 1.9  11/14/2015.  Patient presented to the emergency room on 07/03/2016 with fever, cough and episodic confusion. He said 2 episodes in the last several days where he had become discretely confused. Patient had a TIA in November diagnosed by primary care. He had been having on and off fevers, shortness of breath, cough and was recently started on antibiotics. Patient was unable to speak during his TIA, he remembered trying to speak and not being able to. He had a outpatient workup for TIA with his PCP that showed MRI with old stroke in 2002 and is getting loose recorder for paroxysmal A. fib. Episodes of confusion were described as disorientation and staring with associated difficulty speaking. Episodes lasted 30-60 minutes then resolved spontaneously. Patient did not have any weakness, numbness, vision or hearing loss. Episodes of happened in the setting of temperature and cough but also happen when patient was feeling well. On admission he was found to have elevated white blood cells 13, CT of the head was negative for acute intracranial abnormalities and neurology was consulted. Neurology felt that these were suggestive of complex partial seizures and not TIAs. Routine EEG was recommended for outpatient 72 hour ambulatory EEG.  Personally reviewed images and agree with the following: MRI brain 06/22/2016: 1. No acute intracranial abnormality. 2. Mild chronic microvascular ischemic changes and parenchymal volume loss for age. Few scattered punctate foci of chronic microhemorrhage are nonspecific but also likely related to microvascular ischemic changes  REVIEW OF SYSTEMS: Out of a complete 14 system review of symptoms, the patient complains only of the following symptoms, and all other reviewed systems are negative.  See history of present illness  ALLERGIES: Allergies  Allergen Reactions  . Rocephin [Ceftriaxone Sodium In Dextrose] Other (See Comments)    C-Diff  . Atorvastatin     Other  reaction(s): myalgia  . Pravastatin     Other reaction(s): myalgia  . Rosuvastatin Calcium     Other reaction(s): fatigued    HOME MEDICATIONS: Outpatient Medications Prior to Visit  Medication Sig Dispense Refill  . aspirin EC 325 MG tablet Take 325 mg by mouth daily.    . Cholecalciferol 1000 units capsule Take 4,000 Units by mouth daily.     . clopidogrel (PLAVIX) 75 MG tablet Take 75 mg by mouth daily.   0  . ferrous sulfate 325 (65 FE) MG EC tablet Take 325 mg by mouth daily with breakfast.    . ibuprofen (ADVIL,MOTRIN) 200 MG tablet Take 600 mg by mouth daily as needed for headache or moderate pain.     Marland Kitchen levETIRAcetam (KEPPRA) 750 MG tablet take 1 tablet by mouth twice a day 60 tablet 5  . LYCOPENE PO Take 10 mg by mouth daily.     . Multiple Vitamins-Minerals (ICAPS) CAPS Take 1 capsule by mouth daily.    . Omega-3 Fatty Acids (FISH OIL) 1000 MG CAPS Take 2,000 mg by mouth daily.    . ranitidine (ZANTAC) 150 MG tablet Take 300 mg by mouth daily.   0  . Saw Palmetto 450  MG CAPS Take 900 mg by mouth daily.     . vitamin B-12 (CYANOCOBALAMIN) 1000 MCG tablet Take 1,000 mcg by mouth daily.    Marland Kitchen ZETIA 10 MG tablet Take 10 mg by mouth daily.   0   No facility-administered medications prior to visit.     PAST MEDICAL HISTORY: Past Medical History:  Diagnosis Date  . A-fib (HCC)   . Arthritis   . GERD (gastroesophageal reflux disease)   . Heart murmur   . HLD (hyperlipidemia)   . Seizures (HCC)    last sz 04/17/17  . Stroke [facility])    tia    PAST SURGICAL HISTORY: Past Surgical History:  Procedure Laterality Date  . CARPAL TUNNEL RELEASE     About 10 yr ago (08/13/16)  . LOOP RECORDER INSERTION N/A 09/16/2016   Procedure: Loop Recorder Insertion;  Surgeon: Hillis Range, MD;  Location: [facility] INVASIVE CV LAB;  Service: Cardiovascular;  Laterality: N/A;  . TONSILLECTOMY      FAMILY HISTORY: Family History  Problem Relation Age of Onset  . Hypertension Mother   . Parkinson's  disease Mother   . Heart attack Mother   . Stroke Sister   . Lymphoma Sister   . Stroke Brother   . CVA Father   . Stroke Paternal Grandmother        in her early 6's  . Stroke Paternal Grandfather        in his early 66's  . Diabetes Neg Hx     SOCIAL HISTORY: Social History   Social History  . Marital status: Widowed    Spouse name: N/A  . Number of children: 1  . Years of education: 94   Occupational History  . Retired    Social History Main Topics  . Smoking status: Never Smoker  . Smokeless tobacco: Never Used  . Alcohol use No  . Drug use: No  . Sexual activity: Not on file   Other Topics Concern  . Not on file   Social History Narrative   04/19/17 Lives alone   Caffeine use: none      PHYSICAL EXAM  Vitals:   04/19/17 1256  BP: 126/73  Pulse: 64  Weight: 155 lb 12.8 oz (70.7 kg)   Body mass index is 25.15 kg/m.  Generalized: Well developed, in no acute distress   Neurological examination  Mentation: Alert oriented to time, place, history taking. Follows all commands speech and language fluent Cranial nerve II-XII: Pupils were equal round reactive to light. Extraocular movements were full, visual field were full on confrontational test. Facial sensation and strength were normal. Uvula tongue midline. Head turning and shoulder shrug  were normal and symmetric. Motor: The motor testing reveals 5 over 5 strength of all 4 extremities. Good symmetric motor tone is noted throughout.  Sensory: Sensory testing is intact to soft touch on all 4 extremities. No evidence of extinction is noted.  Coordination: Cerebellar testing reveals good finger-nose-finger and heel-to-shin bilaterally.  Gait and station: Gait is normal.    DIAGNOSTIC DATA (LABS, IMAGING, TESTING) - I reviewed patient records, labs, notes, testing and imaging myself where available.  Lab Results  Component Value Date   WBC 6.3 09/09/2016   HGB 12.2 (L) 09/09/2016   HCT 37.6 (L)  09/09/2016   MCV 91.0 09/09/2016   PLT 179 09/09/2016      Component Value Date/Time   NA 142 09/09/2016 1108   NA 137 08/13/2016 1241   K 4.0 09/09/2016  1108   CL 106 09/09/2016 1108   CO2 27 09/09/2016 1108   GLUCOSE 105 (H) 09/09/2016 1108   BUN 14 09/09/2016 1108   BUN 16 08/13/2016 1241   CREATININE 1.10 09/09/2016 1108   CALCIUM 9.5 09/09/2016 1108   PROT 6.9 09/09/2016 1108   PROT 6.9 08/13/2016 1241   ALBUMIN 4.0 09/09/2016 1108   ALBUMIN 4.1 08/13/2016 1241   AST 23 09/09/2016 1108   ALT 14 (L) 09/09/2016 1108   ALKPHOS 93 09/09/2016 1108   BILITOT 0.6 09/09/2016 1108   BILITOT 0.4 08/13/2016 1241   GFRNONAA 59 (L) 09/09/2016 1108   GFRAA >60 09/09/2016 1108   Lab Results  Component Value Date   CHOL 120 08/03/2016   HDL 37 (L) 08/03/2016   LDLCALC 69 08/03/2016   TRIG 71 08/03/2016   CHOLHDL 3.2 08/03/2016   Lab Results  Component Value Date   HGBA1C 5.6 08/03/2016      ASSESSMENT AND PLAN 83 y.o. year old male  has a past medical history of A-fib (HCC); Arthritis; GERD (gastroesophageal reflux disease); Heart murmur; HLD (hyperlipidemia); Seizures (HCC); and Stroke (HCC). here with:  1. Seizures  The patient had an additional seizure on Sunday. He does not feel that he can tolerate increased dose of Keppra. He would actually like to be taken off this medication due to drowsiness. I will start the patient on Vimpat 50 mg twice a day. He will remain on the current dose of Keppra. If he is tolerating Vimpat we will increase his dose 100 mg twice a day in 2 weeks. If he continues to tolerate Vimpat well we will consider slowly weaning the patient off of Keppra. I have reviewed side effects of Vimpat with the patient and his daughter. I have advised that if he has any additional seizure events he should let us know. The patient should not operate a motor vehicle, heavy machinery, perform activities at [facility] or participate in water sports until he is  seizure-free for 6 months. Patient voiced understanding. He will follow back up in November.   Butch PennyMegan Milda Lindvall, MSN, NP-C 04/19/2017, 12:59 PM [facility] [address], [address] [place], [place] [address] [phone]

## 2017-04-20 LAB — CUP PACEART REMOTE DEVICE CHECK
Date Time Interrogation Session: 20180906180801
MDC IDC PG IMPLANT DT: 20180208

## 2017-05-05 ENCOUNTER — Telehealth: Payer: Self-pay | Admitting: Adult Health

## 2017-05-05 ENCOUNTER — Other Ambulatory Visit: Payer: Self-pay | Admitting: Neurology

## 2017-05-05 MED ORDER — LACOSAMIDE 100 MG PO TABS: 100.0000 mg | ORAL_TABLET | Freq: Every day | ORAL | 11 refills | Status: DC

## 2017-05-05 NOTE — Telephone Encounter (Signed)
Yes, ordered. thanks

## 2017-05-05 NOTE — Telephone Encounter (Signed)
Patients daughter [name] (listed on DPR) called office in reference to lacosamide (VIMPAT) 50 MG TABS tablet.  She states patient is tolerating medication with no side effects or problems.  [name] would like to know if the medication is going to be increased to .  Pharmacy-  [facility].  Please call

## 2017-05-05 NOTE — Telephone Encounter (Signed)
I faxed signed Rx for Vimpat  to [facility].

## 2017-05-05 NOTE — Telephone Encounter (Signed)
Ok to use up the 50mg  po tabs of vimpat by taki ll discuss weaning of keppra.  [name] verbalized understanding.

## 2017-05-12 NOTE — Telephone Encounter (Signed)
Spoke with daughter, [name] on [place] and advised her per NP that the patient is to take Vimpat 100 mg twice daily. She stated that the bottle she picked up stated to take 100 mg once daily.    Also advised her that he is to continue taking keppra as prescribed for two weeks after increasing Vimpat to 100 mg BID. Then she is to call this office to update how he is doing. At that time NP will consider a taper off Keppra. She then stated he needs refill on Keppra. This RN advised her it was refilled on 04/08/17 for 6 months.  Stated will call pharmacy to check on both these medications. [name] verbalized understanding, appreciation.  [facility], spoke with Olegario Messier, pharmacist.  Jovita Gamma her verbal order for Vimpat 100 mg tabs, take one tab twice daily, 5 refills. She stated family member picked up refill a few days ago, #60 in bottle, but sig did stated take one daily. She will reorder Vimpat at new Rx per NP. Asked about his Keppra; Olegario Messier stated he has refills on file, so daughter can pick up refill tomorrow.  Cathy verbalized understanding of call.

## 2017-05-12 NOTE — Telephone Encounter (Signed)
Pt's daughter called, he is doing well on  at bedtime which is what her father said is directions on the bottle. She thought it was  bid not just at night so please clarify. Also does he stay on  bid keppra. She said if so he is almost out. Please call to discuss and clarify.

## 2017-05-16 ENCOUNTER — Ambulatory Visit (INDEPENDENT_AMBULATORY_CARE_PROVIDER_SITE_OTHER): Payer: Medicare Other | Admitting: *Deleted

## 2017-05-16 DIAGNOSIS — R002 Palpitations: Secondary | ICD-10-CM | POA: Diagnosis not present

## 2017-05-17 DIAGNOSIS — Z23 Encounter for immunization: Secondary | ICD-10-CM | POA: Diagnosis not present

## 2017-05-17 NOTE — Progress Notes (Signed)
Carelink Summary Report / Loop Recorder 

## 2017-05-19 LAB — CUP PACEART REMOTE DEVICE CHECK
Date Time Interrogation Session: 20181006181235
Implantable Pulse Generator Implant Date: 20180208

## 2017-05-23 MED ORDER — LEVETIRACETAM 500 MG PO TABS: 500.0000 mg | ORAL_TABLET | Freq: Two times a day (BID) | ORAL | 0 refills | Status: DC

## 2017-05-23 MED ORDER — LACOSAMIDE 100 MG PO TABS: 150.0000 mg | ORAL_TABLET | Freq: Two times a day (BID) | ORAL | 5 refills | Status: DC

## 2017-05-23 NOTE — Telephone Encounter (Signed)
Daughter called in [phone]) tolerated vompat  doseage with no side effects. She is thinking medication will be increased again and if so when will he start tapering off keppra. Please call to discuss

## 2017-05-23 NOTE — Telephone Encounter (Signed)
I spoke with Nicholas Millet, NP. She recommends that pt increase vimpat to  BID and decrease keppa to  BID x 2 weeks.  I called pt's daughter, Nicholas Gaines, per DPR, and advised her of these changes. Pt's daughter will call us with an update in 2 weeks regarding how the decrease in keppra and increase in vimpat is working. Pt's daughter verbalized understanding. She asked that these prescriptions be sent to [facility] on [address]. I reiterated with pt's daughter that pt is at an increased risk for seizure activity while his medications are being adjusted, and I reminded her that pt should not be driving, swimming, working at heights, or with heavy machinery during this time. Pt's daughter verbalized understanding.

## 2017-05-23 NOTE — Addendum Note (Signed)
Addended by: Geronimo Running A on: 05/23/2017 04:23 PM   Modules accepted: Orders

## 2017-06-08 ENCOUNTER — Telehealth: Payer: Self-pay | Admitting: Adult Health

## 2017-06-08 NOTE — Progress Notes (Signed)
Personally  participated in, made any corrections needed, and agree with history, physical, neuro exam,assessment and plan as stated above.    Libbi Towner, MD [facility] 

## 2017-06-08 NOTE — Telephone Encounter (Signed)
I called and spoke to the patient's daughter.  She states in the last week he has been having more difficulty with his balance.  He has had 3 falls.  She feels that this is correlating with Vimpat.  I have asked if he can come in tomorrow for an office visit.   they are amenable to this plan.

## 2017-06-08 NOTE — Telephone Encounter (Signed)
Pt daughter(on DPR) has called re: medication status, she stated pt is being weined off Keppra and Vimpat is being increased.  She stated pt has fallen 3 times in last week but feels that is due to age and being weak.  Pt daughter asking for Physical Therapy twice a wemaryek please call

## 2017-06-09 ENCOUNTER — Encounter: Payer: Self-pay | Admitting: Adult Health

## 2017-06-09 ENCOUNTER — Ambulatory Visit (INDEPENDENT_AMBULATORY_CARE_PROVIDER_SITE_OTHER): Payer: Medicare Other | Admitting: Adult Health

## 2017-06-09 VITALS — BP 126/74 | HR 70 | Wt 155.8 lb

## 2017-06-09 DIAGNOSIS — R569 Unspecified convulsions: Secondary | ICD-10-CM

## 2017-06-09 DIAGNOSIS — W19XXXA Unspecified fall, initial encounter: Secondary | ICD-10-CM

## 2017-06-09 DIAGNOSIS — R269 Unspecified abnormalities of gait and mobility: Secondary | ICD-10-CM | POA: Diagnosis not present

## 2017-06-09 MED ORDER — LACOSAMIDE 100 MG PO TABS: 100.0000 mg | ORAL_TABLET | Freq: Two times a day (BID) | ORAL | 5 refills | Status: DC

## 2017-06-09 NOTE — Progress Notes (Signed)
Personally  participated in, made any corrections needed, and agree with history, physical, neuro exam,assessment and plan as stated above.    Antonia Ahern, MD [facility] 

## 2017-06-09 NOTE — Patient Instructions (Signed)
Your Plan:  Continue Keppra 500 mg twice a day Decrease Vimpat 100 mg twice a day Physical therapy for walking and balance If you have any seizure events please let us know If your symptoms worsen or you develop new symptoms please let us know.   [facility]. I hope we have been able to provide you high quality care today.  You may receive a patient satisfaction survey over the next few weeks. We would appreciate your feedback and comments so that we may continue to improve ourselves and the health of our patients.

## 2017-06-09 NOTE — Progress Notes (Signed)
PATIENT: Nicholas Gaines DOB: October 09, 1928  REASON FOR VISIT: follow up HISTORY FROM: patient  HISTORY OF PRESENT ILLNESS: Today 06/09/17  Nicholas Gaines is an 83 year old male with a history of seizures.  He returns today for follow-up.  Approximately 2 weeks ago we increased his Vimpat to 150 mg twice a day.  His Keppra was reduced to 500 mg twice a day.  He has not had any additional seizure events however his daughter feels that his gait has been affected.  She states in the last week he has had 3 falls.  She states that he has always had some trouble with his gait but he has not had falls.  She feels that this is directly related to the Vimpat.  She reports he continues to be very drowsy.  She states that she and the patient has had discussions and they would rather him have quality of life and not the potential side effects that he is experiencing.  She reports that they would like to decrease the Vimpat and see how he does.  She voices that she understands that there is a potential for him to have a seizure.  Patient also agrees with this plan.  He returns today for an evaluation.  HISTORY 04/19/17 Nicholas Gaines is a 83 year old male with a history of seizures. He returns today for follow-up. The patient had a seizure on Sunday night after church. The patient reports that he had not missed any recent doses. He does state that he believes he may have missed a dose on Tuesday. The patient states that his seizure presented the same as it has in the past. He had difficulty speaking, was unable to get his words out. The people around him noted that his speech did not make sense. The patient's daughter states that this had to start after 7 PM and when she talked to him at 7:30 he was back to normal. The patient did not lose consciousness. Was not convulsing and extremities. In the past he has been on Trileptal. He is remains on Keppra 750 twice a day. He states that this makes him extremely drowsy. He  states that the current dose he wants to sleep most of the day. He feels that this has disrupted his quality of life because of the drowsiness.  HISTORY 12/27/16: Nicholas Gaines is an 83 year old male with a history of seizures. He returns today for follow-up. He reports that he has not had any additional seizures since he called our office at thebeginning of April. He continues to take Keppra 750 mg twice a day. He lives at home alone. He is able to complete all ADLs independently. His daughter states that he is managing a garden. The patient is operating a motor vehicle. He reports that he is tolerating the medication although he does notice it makes him more sleepy throughout the day. He denies any new neurological symptoms. He returns today for an evaluation.  HISTORY 08/13/16: Nicholas Busman Thompsonis a 83 y.o.malehere as a referral from Dr. Ivette Loyal TIA vs seizures. Past medical history of atrial fibrillation, heart murmur, stroke in 2002, esophageal stricture, hyperlipidemia, TIA, prediabetic. He is on aspirin 325 and Plavix 75. Patient has had several episodes of staring, confusion, disorientation and unintelligible speech. Usually back to baseline within a half hour. No distinct recollection of the events and only slightly before and after them. No weakness or numbness or incoordination or gait disturbances. No headaches. Episodes happened when he was feeling well but also in  the setting of fevers and cough and respiratory infection. The first episode was in November, he was fishing and he tried to talk and couldn't get his words out, lasted 15 minutes, he could talk when he got home. Daughter is here and provides much information, she saw him later that evening and it was fine. He denies any other associated symptoms such as weakness or confusion or vision changes. His friend in the car noticed that there was something wrong, no LOC, no urination or abnormal movements, he was in his usual state of health at  the time no new medications or anything new but he was starting to feel ill at the time. Next episode was on Christmas eve in the setting of illness, daughter was calling him and he did not answer and he was sitting in the car with his wet clothes on from vomiting and he was asleep in the car, he was weak, he was running a fever of 102 and he was not taken to the hospital. On Christmas day he didn't recognize family, he was speaking in "word salad" and he was aimlessly wandering around the house and looking into space and not saying anything, couldn;t tell his daughter's name. No events since being discharged from the hospital. No current focal neurologic deficits, other associated symptoms or modifiable factors. He has not had any more incidents since starting AEDs, was started on trileptal in the hospital.   Reviewed notes, labs and imaging from outside physicians, which showed:   LDL 69, BUN 19, creatinine 1.1, A1c 5.6 08/03/2016. TSH 1.9 11/14/2015.  Patient presented to the emergency room on 07/03/2016 with fever, cough and episodic confusion. He said 2 episodes in the last several days where he had become discretely confused. Patient had a TIA in November diagnosed by primary care. He had been having on and off fevers, shortness of breath, cough and was recently started on antibiotics. Patient was unable to speak during his TIA, he remembered trying to speak and not being able to. He had a outpatient workup for TIA with his PCP that showed MRI with old stroke in 2002 and is getting loose recorder for paroxysmal A. fib. Episodes of confusion were described as disorientation and staring with associated difficulty speaking. Episodes lasted 30-60 minutes then resolved spontaneously. Patient did not have any weakness, numbness, vision or hearing loss. Episodes of happened in the setting of temperature and cough but also happen when patient was feeling well. On admission he was found to have elevated white  blood cells 13, CT of the head was negative for acute intracranial abnormalities and neurology was consulted. Neurology felt that these were suggestive of complex partial seizures and not TIAs. Routine EEG was recommended for outpatient 72 hour ambulatory EEG.  Personally reviewed images and agree with the following: MRI brain 06/22/2016: 1. No acute intracranial abnormality. 2. Mild chronic microvascular ischemic changes and parenchymal volume loss for age. Few scattered punctate foci of chronic microhemorrhage are nonspecific but also likely related to microvascular ischemic changes  REVIEW OF SYSTEMS: Out of a complete 14 system review of symptoms, the patient complains only of the following symptoms, and all other reviewed systems are negative.  Back pain, aching muscles, muscle cramps, walking difficulty, daytime sleepiness, confusion, decreased concentration, nervous/anxious, confusion, speech difficulty, weakness, tremors, memory loss, incontinence of bladder, fatigue  ALLERGIES: Allergies  Allergen Reactions  . Rocephin [Ceftriaxone Sodium In Dextrose] Other (See Comments)    C-Diff  . Atorvastatin  Other reaction(s): myalgia  . Pravastatin     Other reaction(s): myalgia  . Rosuvastatin Calcium     Other reaction(s): fatigued    HOME MEDICATIONS: Outpatient Medications Prior to Visit  Medication Sig Dispense Refill  . aspirin EC 325 MG tablet Take 325 mg by mouth daily.    . Cholecalciferol 1000 units capsule Take 4,000 Units by mouth daily.     . clopidogrel (PLAVIX) 75 MG tablet Take 75 mg by mouth daily.   0  . ferrous sulfate 325 (65 FE) MG EC tablet Take 325 mg by mouth daily with breakfast.    . ibuprofen (ADVIL,MOTRIN) 200 MG tablet Take 600 mg by mouth daily as needed for headache or moderate pain.     . Lacosamide (VIMPAT) 100 MG TABS Take 1.5 tablets (150 mg total) by mouth 2 (two) times daily. 90 tablet 5  . levETIRAcetam (KEPPRA) 500 MG tablet Take 1 tablet (500  mg total) by mouth 2 (two) times daily. 60 tablet 0  . LYCOPENE PO Take 10 mg by mouth daily.     . Multiple Vitamins-Minerals (ICAPS) CAPS Take 1 capsule by mouth daily.    . Omega-3 Fatty Acids (FISH OIL) 1000 MG CAPS Take 2,000 mg by mouth daily.    . ranitidine (ZANTAC) 150 MG tablet Take 300 mg by mouth daily.   0  . Saw Palmetto 450 MG CAPS Take 900 mg by mouth daily.     . vitamin B-12 (CYANOCOBALAMIN) 1000 MCG tablet Take 1,000 mcg by mouth daily.    Marland Kitchen. ZETIA 10 MG tablet Take 10 mg by mouth daily.   0   No facility-administered medications prior to visit.     PAST MEDICAL HISTORY: Past Medical History:  Diagnosis Date  . A-fib (HCC)   . Arthritis   . GERD (gastroesophageal reflux disease)   . Heart murmur   . HLD (hyperlipidemia)   . Seizures (HCC)    last sz 04/17/17  . Stroke [facility](HCC)    tia    PAST SURGICAL HISTORY: Past Surgical History:  Procedure Laterality Date  . CARPAL TUNNEL RELEASE     About 10 yr ago (08/13/16)  . LOOP RECORDER INSERTION N/A 09/16/2016   Procedure: Loop Recorder Insertion;  Surgeon: Hillis RangeJames Allred, MD;  Location: [facility] INVASIVE CV LAB;  Service: Cardiovascular;  Laterality: N/A;  . TONSILLECTOMY      FAMILY HISTORY: Family History  Problem Relation Age of Onset  . Hypertension Mother   . Parkinson's disease Mother   . Heart attack Mother   . Stroke Sister   . Lymphoma Sister   . Stroke Brother   . CVA Father   . Stroke Paternal Grandmother        in her early 5970's  . Stroke Paternal Grandfather        in his early 5570's  . Diabetes Neg Hx     SOCIAL HISTORY: Social History   Social History  . Marital status: Widowed    Spouse name: N/A  . Number of children: 1  . Years of education: 1512   Occupational History  . Retired    Social History Main Topics  . Smoking status: Never Smoker  . Smokeless tobacco: Never Used  . Alcohol use No  . Drug use: No  . Sexual activity: Not on file   Other Topics Concern  . Not on file    Social History Narrative   04/19/17 Lives alone   Caffeine use: none  PHYSICAL EXAM  Vitals:   06/09/17 1355  BP: 126/74  Pulse: 70  Weight: 155 lb 12.8 oz (70.7 kg)   Body mass index is 25.15 kg/m.  Generalized: Well developed, in no acute distress   Neurological examination  Mentation: Alert oriented to time, place, history taking. Follows all commands speech and language fluent Cranial nerve II-XII: Pupils were equal round reactive to light. Extraocular movements were full, visual field were full on confrontational test. Facial sensation and strength were normal. Uvula tongue midline. Head turning and shoulder shrug  were normal and symmetric. Motor: The motor testing reveals 5 over 5 strength of all 4 extremities. Good symmetric motor tone is noted throughout.  Sensory: Sensory testing is intact to soft touch on all 4 extremities. No evidence of extinction is noted.  Coordination: Cerebellar testing reveals good finger-nose-finger and heel-to-shin bilaterally.  Gait and station: Patient has some shuffling gait.  Tandem gait is not attempted.  Romberg is negative. Reflexes: Deep tendon reflexes are symmetric and normal bilaterally.   DIAGNOSTIC DATA (LABS, IMAGING, TESTING) - I reviewed patient records, labs, notes, testing and imaging myself where available.  Lab Results  Component Value Date   WBC 6.3 09/09/2016   HGB 12.2 (L) 09/09/2016   HCT 37.6 (L) 09/09/2016   MCV 91.0 09/09/2016   PLT 179 09/09/2016      Component Value Date/Time   NA 142 09/09/2016 1108   NA 137 08/13/2016 1241   K 4.0 09/09/2016 1108   CL 106 09/09/2016 1108   CO2 27 09/09/2016 1108   GLUCOSE 105 (H) 09/09/2016 1108   BUN 14 09/09/2016 1108   BUN 16 08/13/2016 1241   CREATININE 1.10 09/09/2016 1108   CALCIUM 9.5 09/09/2016 1108   PROT 6.9 09/09/2016 1108   PROT 6.9 08/13/2016 1241   ALBUMIN 4.0 09/09/2016 1108   ALBUMIN 4.1 08/13/2016 1241   AST 23 09/09/2016 1108   ALT 14  (L) 09/09/2016 1108   ALKPHOS 93 09/09/2016 1108   BILITOT 0.6 09/09/2016 1108   BILITOT 0.4 08/13/2016 1241   GFRNONAA 59 (L) 09/09/2016 1108   GFRAA >60 09/09/2016 1108   Lab Results  Component Value Date   CHOL 120 08/03/2016   HDL 37 (L) 08/03/2016   LDLCALC 69 08/03/2016   TRIG 71 08/03/2016   CHOLHDL 3.2 08/03/2016   Lab Results  Component Value Date   HGBA1C 5.6 08/03/2016      ASSESSMENT AND PLAN 83 y.o. year old male  has a past medical history of A-fib (HCC); Arthritis; GERD (gastroesophageal reflux disease); Heart murmur; HLD (hyperlipidemia); Seizures (HCC); and Stroke (HCC). here with:  1.  Seizures 3.  Falls  I had a long discussion with the patient, his daughter and grandson.  At this time they are more concerned about the patient's quality of life and would rather him not have all the side effects he has been experiencing with the seizure medications.  I did explain that we are trying to wean the patient off of Keppra in order to reduce his drowsiness but we are taking this slow in order to prevent more seizures.  They voiced understanding but would like to decrease the Vimpat back down to 100 mg they do understand that the patient may have an additional seizure due to the decrease.  Patient also voices understanding.  He will continue on Keppra 500 mg twice a day for now.  If he has any additional seizures they will let us know.  The  patient and family do feel that he would be benefited by physical therapy.  I am amenable to this plan.  He will follow-up in 3 months or sooner if needed.  I spent 15 minutes with the patient. 50% of this time was spent discussing his medication and seizure prevention.  Butch Penny, MSN, NP-C 06/09/2017, 2:06 PM [facility] [address] [place], [place] [address] [phone]

## 2017-06-13 ENCOUNTER — Ambulatory Visit (INDEPENDENT_AMBULATORY_CARE_PROVIDER_SITE_OTHER): Payer: Medicare Other | Admitting: *Deleted

## 2017-06-13 DIAGNOSIS — R002 Palpitations: Secondary | ICD-10-CM

## 2017-06-14 ENCOUNTER — Other Ambulatory Visit: Payer: Self-pay | Admitting: Internal Medicine

## 2017-06-14 NOTE — Progress Notes (Signed)
Carelink Summary Report / Loop Recorder 

## 2017-06-16 LAB — CUP PACEART REMOTE DEVICE CHECK
Implantable Pulse Generator Implant Date: 20180208
MDC IDC SESS DTM: 20181105190737

## 2017-06-20 ENCOUNTER — Encounter: Payer: Self-pay | Admitting: Physical Therapy

## 2017-06-20 ENCOUNTER — Ambulatory Visit: Payer: Medicare Other | Attending: Adult Health | Admitting: Physical Therapy

## 2017-06-20 DIAGNOSIS — M25562 Pain in left knee: Secondary | ICD-10-CM | POA: Insufficient documentation

## 2017-06-20 DIAGNOSIS — R2681 Unsteadiness on feet: Secondary | ICD-10-CM | POA: Insufficient documentation

## 2017-06-20 DIAGNOSIS — G8929 Other chronic pain: Secondary | ICD-10-CM | POA: Diagnosis not present

## 2017-06-20 DIAGNOSIS — R296 Repeated falls: Secondary | ICD-10-CM | POA: Diagnosis not present

## 2017-06-20 DIAGNOSIS — R2689 Other abnormalities of gait and mobility: Secondary | ICD-10-CM

## 2017-06-20 DIAGNOSIS — M25561 Pain in right knee: Secondary | ICD-10-CM | POA: Insufficient documentation

## 2017-06-20 DIAGNOSIS — M6281 Muscle weakness (generalized): Secondary | ICD-10-CM | POA: Diagnosis not present

## 2017-06-20 NOTE — Therapy (Signed)
[facility] [address] [place], [place], [address] Phone: [phone]   Fax:  [phone]  Physical Therapy Evaluation  Patient Details  Name: Nicholas Gaines MRN: [id number] Date of Birth: 81/07/1929 Referring Provider: Butch PennyMegan Millikan, NP   Encounter Date: 06/20/2017  PT End of Session - 06/20/17 1113    Visit Number  1    Number of Visits  17    Date for PT Re-Evaluation  08/19/17    Authorization Type  [employer]; G code and PN every 10th visit    PT Start Time  1020    PT Stop Time  1105    PT Time Calculation (min)  45 min    Activity Tolerance  Patient tolerated treatment well    Behavior During Therapy  [facility] for tasks assessed/performed       Past Medical History:  Diagnosis Date  . A-fib (HCC)   . Arthritis   . GERD (gastroesophageal reflux disease)   . Heart murmur   . HLD (hyperlipidemia)   . Seizures (HCC)    last sz 04/17/17  . Stroke [facility](HCC)    tia    Past Surgical History:  Procedure Laterality Date  . CARPAL TUNNEL RELEASE     About 10 yr ago (08/13/16)  . TONSILLECTOMY      There were no vitals filed for this visit.   Subjective Assessment - 06/20/17 1026    Subjective  Pt reports difficulty with walking with noticeable shuffling, multiple falls and inability to rise from the floor after falling after increasing VIMPAT; 3 falls in one week - prior to that pt was falling once every 2-3 months.  Has been on this medication x 3 months and was ramping up to higher dosage.  Neurologist has decreased dosage due to increase in falls.  Daughter reports tremors while on higher dosage of medication but not now; denies freezing of gait.    Patient is accompained by:  Family member daughter    Pertinent History  seizures, OA, afib, heart murmur, HLD, carpal tunnel release, and CVA with loop recorder insertion in 09/2016, multiple falls    Limitations  Walking    Patient Stated Goals   To improve his walking and improve his shoulder and leg strength    Currently in Pain?  No/denies         [facility] PT Assessment - 06/20/17 1034      Assessment   Medical Diagnosis  abnormal gait and falls    Referring Provider  Butch PennyMegan Millikan, NP    Onset Date/Surgical Date  06/09/17    Prior Therapy  None      Precautions   Precautions  Fall;Other (comment)    Precaution Comments  seizures, OA, afib, heart murmur, HLD, carpal tunnel release, and CVA with loop recorder insertion in 09/2016, multiple falls      Balance Screen   Has the patient fallen in the past 6 months  Yes    How many times?  >3    Has the patient had a decrease in activity level because of a fear of falling?   No    Is the patient reluctant to leave their home because of a fear of falling?   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      Observation/Other Assessments   Focus on Therapeutic Outcomes (FOTO)   Not assessed      Sensation   Light Touch  Appears Intact      Posture/Postural Control   Posture/Postural Control  Postural limitations    Postural Limitations  Rounded Shoulders;Forward head;Flexed trunk;Posterior pelvic tilt crouched stance      ROM / Strength   AROM / PROM / Strength  Strength      Strength   Overall Strength  Due to pain    Overall Strength Comments  4+/5 bilaterally; reported mild knee pain with resistance      Ambulation/Gait   Ambulation/Gait  Yes    Ambulation/Gait Assistance  5: Supervision    Ambulation Distance  (Feet)  115 Feet    Assistive device  None    Gait Pattern  Step-through pattern;Decreased arm swing - right;Decreased arm swing - left;Decreased weight shift to right;Decreased weight shift to left;Right flexed knee in stance;Left flexed knee in stance;Shuffle;Decreased trunk rotation;Trunk flexed;Narrow base of support;Poor foot clearance - left;Poor foot clearance - right    Ambulation Surface  Level;Indoor      Standardized Balance Assessment   Standardized Balance Assessment  Berg Balance Test;Five Times Sit to Stand;10 meter walk test    Five times sit to stand comments   12.25    10 Meter Walk  13.6 seconds or 2.4 ft/sec      Berg Balance Test   Sit to Stand  Able to stand without using hands and stabilize independently    Standing Unsupported  Able to stand safely 2 minutes    Sitting with Back Unsupported but Feet Supported on Floor or Stool  Able to sit safely and securely 2 minutes    Stand to Sit  Sits safely with minimal use of hands    Transfers  Able to transfer safely, minor use of hands    Standing Unsupported with Eyes Closed  Able to stand 10 seconds with supervision    Standing Ubsupported with Feet Together  Able to place feet together independently and stand for 1 minute with supervision    From Standing, Reach Forward with Outstretched Arm  Can reach forward >12 cm safely (5")    From Standing Position, Pick up Object from Floor  Able to pick up shoe, needs supervision    From Standing Position, Turn to Look Behind Over each Shoulder  Turn sideways only but maintains balance    Turn 360 Degrees  Able to turn 360 degrees safely but slowly    Standing Unsupported, Alternately Place Feet on Step/Stool  Able to complete 4 steps without aid or supervision    Standing Unsupported, One Foot in Front  Able to take small step independently and hold 30 seconds    Standing on One Leg  Tries to lift leg/unable to hold 3 seconds but remains standing independently    Total Score  41     Berg comment:  41/56 significant risk for falls  Objective measurements completed on examination: See above findings.              PT Education - 06/20/17 1112    Education provided  Yes    Education Details  clinical findings, PT POC and goals    Person(s) Educated  Patient;Child(ren)    Methods  Explanation    Comprehension  Verbalized understanding       PT Short Term Goals - 06/20/17 1123      PT SHORT TERM GOAL #1   Title  Will perform HEP (PWR, stretch, strengthen, balance) with supervision of daughter    Time  4    Period  Weeks    Status  New    Target Date  07/20/17      PT SHORT TERM GOAL #2   Title  Will participate in further assessment of LE ROM (hamstrings and hip flexors) and will initiate stretching HEP for increased hip and knee extension ROM    Time  4    Period  Weeks    Status  New    Target Date  07/20/17      PT SHORT TERM GOAL #3   Title  Will decrease five time sit to stand time to < or = 10 seconds from chair without UE support and will demonstrate increased terminal knee and hip extension during standing    Baseline  12.25 seconds    Time  4    Period  Weeks    Status  New    Target Date  07/20/17      PT SHORT TERM GOAL #4   Title  Will increase BERG balance score by 5 points from eval (41/56)    Baseline  41/56    Time  4    Period  Weeks    Status  New    Target Date  07/20/17      PT SHORT TERM GOAL #5   Title  Will increase gait speed to > or = 2.30ft/sec    Baseline  2.4 ft/sec    Time  4    Period  Weeks    Status  New    Target Date  07/20/17        PT Long Term Goals - 06/20/17 1129      PT LONG TERM GOAL #1   Title  Pt will demonstrate independence with HEP and will identify community wellness to participate in at D/C    Time  8    Period  Weeks    Status  New    Target Date  08/19/17      PT LONG TERM GOAL #2   Title  Will demonstrate 10 deg increase in hip and knee extension ROM     Baseline  TBD    Time  8    Period  Weeks    Status  New    Target Date  08/19/17      PT LONG TERM GOAL #3   Title  Will increase BERG balance score 10 points from initial assessment (41/56)    Baseline  41/56    Time  8    Period  Weeks    Status  New    Target Date  08/19/17      PT LONG TERM GOAL #4   Title  Will increase gait speed to > or = 3.0 ft/sec with improved foot clearance, trunk rotation, arm swing    Baseline  2.4 ft/sec  Time  8    Period  Weeks    Status  New    Target Date  08/19/17      PT LONG TERM GOAL #5   Title  Pt will perform floor > stand transfer with UE support and supervision    Baseline  unable    Time  8    Period  Weeks    Status  New    Target Date  08/19/17      Additional Long Term Goals   Additional Long Term Goals  Yes      PT LONG TERM GOAL #6   Title  Will ambulate x 500' over uneven outdoor surfaces and negotiate 4 stairs with L rail, alternating sequence MOD I with improved upright posture, increased knee and hip extension, decreased shuffling during gait    Time  8    Period  Weeks    Status  New    Target Date  08/19/17             Plan - 06/20/17 1114    Clinical Impression Statement  Pt is an 83 year old male presenting to [facility] neuro for evaluation of gait abnormalities and repeated falls following adjustment of seizure medication.  Pt had f/u with neurology office; seizure medicine was reduced due to side effects.  Neurology discussed with pt and family increased risk for seizures with decrease in medication; pt and family verbalized agreement.  Pt's PMH significant for the following: seizures, OA, knee pain, shoulder pain, multiple falls, afib, heart murmur, HLD, carpal tunnel release, and CVA with loop recorder insertion in 09/2016. The following deficits were noted during pt's exam: pain in bilat knees and shoulders, impaired strength, impaired LE ROM, impaired posture, abnormal shuffling gait, impaired balance and  balance reactions.  Pt's gait speed indicates pt is safe for limited community ambulation but below normal for community dwelling adult.  Pt's BERG score indicates pt is at significant risk for falls. Pt would benefit from skilled PT to address these impairments and functional limitations to maximize functional mobility independence and reduce falls risk.    History and Personal Factors relevant to plan of care:  Lives alone, history of multiple falls, seizures, OA, knee pain, shoulder pain, afib, heart murmur, HLD, carpal tunnel release, and CVA with loop recorder insertion in 09/2016    Clinical Presentation  Evolving    Clinical Presentation due to:  Lives alone, history of multiple falls, seizures, OA, afib, knee pain, shoulder pain, heart murmur, HLD, carpal tunnel release, and CVA with loop recorder insertion in 09/2016    Clinical Decision Making  Moderate    Rehab Potential  Good    PT Frequency  2x / week    PT Duration  8 weeks    PT Treatment/Interventions  ADLs/Self Care Home Management;Gait training;Stair training;Functional mobility training;Therapeutic activities;Therapeutic exercise;Balance training;Neuromuscular re-education;Patient/family education;Passive range of motion;Manual techniques;Taping    PT Next Visit Plan  Assessment of LE ROM especially hamstring and hip flexors; initiate stretching, strength, balance HEP - PWR! due to parkinson like gait/impaired rotation; assess stairs with L rail    PT Home Exercise Plan  PWR!, floor transfers when appropriate    Recommended Other Services  possible assessment by movement disorder specialist - DR. TAT?      Consulted and Agree with Plan of Care  Patient;Family member/caregiver    Family Member Consulted  Daughter       Patient will benefit from skilled therapeutic intervention in  order to improve the following deficits and impairments:  Abnormal gait, Decreased balance, Decreased range of motion, Decreased strength, Difficulty  walking, Impaired flexibility, Postural dysfunction, Pain  Visit Diagnosis: Other abnormalities of gait and mobility  Repeated falls  Muscle weakness (generalized)  Unsteadiness on feet  Chronic pain of left knee  Chronic pain of right knee  G-Codes - 07/06/2017 1134    Functional Assessment Tool Used (Outpatient Only)  BERG 41/56, gait velocity 2.4 ft/sec    Functional Limitation  Mobility: Walking and moving around    Mobility: Walking and Moving Around Current Status [id number]  At least 20 percent but less than 40 percent impaired, limited or restricted    Mobility: Walking and Moving Around Goal Status [phone])  At least 1 percent but less than 20 percent impaired, limited or restricted        Problem List Patient Active Problem List   Diagnosis Date Noted  . Palpitations 09/16/2016  . Anemia 09/09/2016  . Aortic valve calcification 09/09/2016  . Aortic valve sclerosis 09/09/2016  . BPH (benign prostatic hyperplasia) 09/09/2016  . Cardiac murmur 09/09/2016  . Essential (primary) hypertension 09/09/2016  . Facial weakness status post cerebrovascular accident 09/09/2016  . H/O: stroke with residual effects 09/09/2016  . History of stroke 09/09/2016  . Prediabetes 09/09/2016  . Vitamin D deficiency 09/09/2016  . Alteration of consciousness 08/14/2016  . Acute encephalopathy 08/02/2016  . Difficulty speaking 08/02/2016  . Bronchitis 08/02/2016  . Mixed hyperlipidemia 08/02/2016  . Stroke (HCC)   . GERD (gastroesophageal reflux disease)   . Gastroesophageal reflux disease without esophagitis   . Transient cerebral ischemia     Dierdre Highman, PT, DPT 2017-07-06    11:35 AM    [facility] [address] [place], [place], [address] Phone: [phone]   Fax:  [phone]  Name: Harden Bramer MRN: [id number] Date of Birth: 1928/12/10

## 2017-06-22 ENCOUNTER — Ambulatory Visit: Payer: Medicare Other | Admitting: Physical Therapy

## 2017-06-22 ENCOUNTER — Encounter: Payer: Self-pay | Admitting: Physical Therapy

## 2017-06-22 DIAGNOSIS — R2689 Other abnormalities of gait and mobility: Secondary | ICD-10-CM | POA: Diagnosis not present

## 2017-06-22 DIAGNOSIS — R2681 Unsteadiness on feet: Secondary | ICD-10-CM

## 2017-06-22 DIAGNOSIS — M6281 Muscle weakness (generalized): Secondary | ICD-10-CM

## 2017-06-22 NOTE — Therapy (Signed)
[facility] [address] [place], [place], [address] Phone: [phone]   Fax:  [phone]  Physical Therapy Treatment  Patient Details  Name: Nicholas Gaines Fullman MRN: [id number] Date of Birth: 12/19/1928 Referring Provider: Butch PennyMegan Millikan, NP   Encounter Date: 06/22/2017  PT End of Session - 06/22/17 2240    Visit Number  2    Number of Visits  17    Date for PT Re-Evaluation  08/19/17    Authorization Type  [employer]; G code and PN every 10th visit    PT Start Time  1103    PT Stop Time  1145    PT Time Calculation (min)  42 min    Activity Tolerance  Patient tolerated treatment well    Behavior During Therapy  [facility] for tasks assessed/performed       Past Medical History:  Diagnosis Date  . A-fib (HCC)   . Arthritis   . GERD (gastroesophageal reflux disease)   . Heart murmur   . HLD (hyperlipidemia)   . Seizures (HCC)    last sz 04/17/17  . Stroke [facility](HCC)    tia    Past Surgical History:  Procedure Laterality Date  . CARPAL TUNNEL RELEASE     About 10 yr ago (08/13/16)  . TONSILLECTOMY      There were no vitals filed for this visit.  Subjective Assessment - 06/22/17 1110    Subjective  Reports no falls. No soreness.     Pertinent History  seizures, OA, afib, heart murmur, HLD, carpal tunnel release, and CVA with loop recorder insertion in 09/2016, multiple falls    Limitations  Walking    Patient Stated Goals  To improve his walking and improve his shoulder and leg strength    Currently in Pain?  Yes    Pain Score  5     Pain Location  Hip hips & thighs    Pain Orientation  Right;Left;Lateral    Pain Descriptors / Indicators  Aching;Sore    Pain Type  Chronic pain    Pain Onset  More than a month ago    Pain Frequency  Intermittent    Aggravating Factors   arthritis, stiffness or sudden increases     Pain Relieving Factors  rest, medication         [facility] PT Assessment -  06/22/17 1100      ROM / Strength   AROM / PROM / Strength  PROM      PROM   Overall PROM Comments  Hip extension Thomas position bil. -10*      Flexibility   Soft Tissue Assessment /Muscle Length  yes    Hamstrings  right -51*, left -58* tested with hip flexed to 90* & measuring knee ext      Ambulation/Gait   Stairs  Yes    Stairs Assistance  5: Supervision    Stairs Assistance Details (indicate cue type and reason)  reports fell on stairs but was carrying items in both hands so could not use rail. He was ascending and fell backwards hitting elbows on concrete. Denies injuries beyond bruising.     Stair Management Technique  One rail Left;Alternating pattern;Forwards    Number of Stairs  4    Ramp  5: Supervision no device    Curb  5: Supervision no device      Therapeutic Exercise: Initiated HEP with stretches Seated hamstrings, hip rotation & trunk rotation. See patient  education                    PT Education - 06/22/17 1100    Education provided  Yes    Education Details  Initial HEP LE stretches    Person(s) Educated  Patient    Methods  Explanation;Demonstration;Tactile cues;Verbal cues;Handout    Comprehension  Verbalized understanding;Returned demonstration;Verbal cues required;Tactile cues required;Need further instruction       PT Short Term Goals - 06/20/17 1123      PT SHORT TERM GOAL #1   Title  Will perform HEP (PWR, stretch, strengthen, balance) with supervision of daughter    Time  4    Period  Weeks    Status  New    Target Date  07/20/17      PT SHORT TERM GOAL #2   Title  Will participate in further assessment of LE ROM (hamstrings and hip flexors) and will initiate stretching HEP for increased hip and knee extension ROM    Time  4    Period  Weeks    Status  New    Target Date  07/20/17      PT SHORT TERM GOAL #3   Title  Will decrease five time sit to stand time to < or = 10 seconds from chair without UE support and will  demonstrate increased terminal knee and hip extension during standing    Baseline  12.25 seconds    Time  4    Period  Weeks    Status  New    Target Date  07/20/17      PT SHORT TERM GOAL #4   Title  Will increase BERG balance score by 5 points from eval (41/56)    Baseline  41/56    Time  4    Period  Weeks    Status  New    Target Date  07/20/17      PT SHORT TERM GOAL #5   Title  Will increase gait speed to > or = 2.46ft/sec    Baseline  2.4 ft/sec    Time  4    Period  Weeks    Status  New    Target Date  07/20/17        PT Long Term Goals - 06/20/17 1129      PT LONG TERM GOAL #1   Title  Pt will demonstrate independence with HEP and will identify community wellness to participate in at D/C    Time  8    Period  Weeks    Status  New    Target Date  08/19/17      PT LONG TERM GOAL #2   Title  Will demonstrate 10 deg increase in hip and knee extension ROM    Baseline  TBD    Time  8    Period  Weeks    Status  New    Target Date  08/19/17      PT LONG TERM GOAL #3   Title  Will increase BERG balance score 10 points from initial assessment (41/56)    Baseline  41/56    Time  8    Period  Weeks    Status  New    Target Date  08/19/17      PT LONG TERM GOAL #4   Title  Will increase gait speed to > or = 3.0 ft/sec with improved foot clearance, trunk rotation, arm swing  Baseline  2.4 ft/sec    Time  8    Period  Weeks    Status  New    Target Date  08/19/17      PT LONG TERM GOAL #5   Title  Pt will perform floor > stand transfer with UE support and supervision    Baseline  unable    Time  8    Period  Weeks    Status  New    Target Date  08/19/17      Additional Long Term Goals   Additional Long Term Goals  Yes      PT LONG TERM GOAL #6   Title  Will ambulate x 500' over uneven outdoor surfaces and negotiate 4 stairs with L rail, alternating sequence MOD I with improved upright posture, increased knee and hip extension, decreased shuffling  during gait    Time  8    Period  Weeks    Status  New    Target Date  08/19/17            Plan - 06/22/17 2241    Rehab Potential  Good    PT Frequency  2x / week    PT Duration  8 weeks    PT Treatment/Interventions  ADLs/Self Care Home Management;Gait training;Stair training;Functional mobility training;Therapeutic activities;Therapeutic exercise;Balance training;Neuromuscular re-education;Patient/family education;Passive range of motion;Manual techniques;Taping    PT Next Visit Plan  review stretches, Add strength, balance HEP - PWR! due to parkinson like gait/impaired rotation;     PT Home Exercise Plan  PWR!, floor transfers when appropriate    Consulted and Agree with Plan of Care  Patient       Patient will benefit from skilled therapeutic intervention in order to improve the following deficits and impairments:  Abnormal gait, Decreased balance, Decreased range of motion, Decreased strength, Difficulty walking, Impaired flexibility, Postural dysfunction, Pain  Visit Diagnosis: Other abnormalities of gait and mobility  Muscle weakness (generalized)  Unsteadiness on feet     Problem List Patient Active Problem List   Diagnosis Date Noted  . Palpitations 09/16/2016  . Anemia 09/09/2016  . Aortic valve calcification 09/09/2016  . Aortic valve sclerosis 09/09/2016  . BPH (benign prostatic hyperplasia) 09/09/2016  . Cardiac murmur 09/09/2016  . Essential (primary) hypertension 09/09/2016  . Facial weakness status post cerebrovascular accident 09/09/2016  . H/O: stroke with residual effects 09/09/2016  . History of stroke 09/09/2016  . Prediabetes 09/09/2016  . Vitamin D deficiency 09/09/2016  . Alteration of consciousness 08/14/2016  . Acute encephalopathy 08/02/2016  . Difficulty speaking 08/02/2016  . Bronchitis 08/02/2016  . Mixed hyperlipidemia 08/02/2016  . Stroke (HCC)   . GERD (gastroesophageal reflux disease)   . Gastroesophageal reflux disease  without esophagitis   . Transient cerebral ischemia     Natajah Derderian PT, DPT 06/22/2017, 10:43 PM  [facility] [address] [place], [place], [address] Phone: [phone]   Fax:  [phone]  Name: Slayter Moorhouse MRN: [id number] Date of Birth: 09/12/1928

## 2017-06-22 NOTE — Patient Instructions (Addendum)
Chair Sitting    Sit at edge of seat, one leg extended, pull toes up towards you, sit "tall" spine straight. Bend forward from the hip, keeping spine straight.  toward toes.  Hold __20-30_ seconds. Repeat __2-3_ times per session. Do __3-5_ sessions per day.  Copyright  VHI. All rights reserved.   Hip Stretch    Sit with foot stool in front of you. Place right foot on stool in front of left leg. Let right knee fall downward, but keep ankle in place. GENTLY push knee downward. Feel the stretch in hip. May push down gently with hand to feel stretch. Hold __20-30__ seconds while counting out loud. Repeat with other leg. Repeat __2-3__ times on each leg. Do _2-3___ sessions per day.  http://gt2.exer.us/497   Copyright  VHI. All rights reserved.   TRUNK: Rotation    Sit at edge of sitting surface with upright posture. Place both hands on outside of your thigh/hip.  Turn at waist to look over shoulder. Hold 20-30 seconds.  _2-3__ reps each direction, _2-3__ sets per day  Copyright  VHI. All rights reserved.

## 2017-06-23 NOTE — Telephone Encounter (Signed)
Pt daughter(on DPR) has called to inform that re: the reduction in dosage of medication pt is doing fine.  There have been no more falls and there has not been any seizures either.  Please call pt daughter re: next  Steps re: medication

## 2017-06-24 ENCOUNTER — Other Ambulatory Visit: Payer: Self-pay | Admitting: Adult Health

## 2017-06-27 ENCOUNTER — Ambulatory Visit: Payer: Medicare Other | Admitting: Physical Therapy

## 2017-06-27 DIAGNOSIS — R2681 Unsteadiness on feet: Secondary | ICD-10-CM | POA: Diagnosis not present

## 2017-06-27 DIAGNOSIS — R296 Repeated falls: Secondary | ICD-10-CM | POA: Diagnosis not present

## 2017-06-27 DIAGNOSIS — R2689 Other abnormalities of gait and mobility: Secondary | ICD-10-CM | POA: Diagnosis not present

## 2017-06-27 DIAGNOSIS — M6281 Muscle weakness (generalized): Secondary | ICD-10-CM

## 2017-06-27 DIAGNOSIS — M25562 Pain in left knee: Secondary | ICD-10-CM | POA: Diagnosis not present

## 2017-06-27 DIAGNOSIS — M25561 Pain in right knee: Secondary | ICD-10-CM | POA: Diagnosis not present

## 2017-06-27 DIAGNOSIS — G8929 Other chronic pain: Secondary | ICD-10-CM | POA: Diagnosis not present

## 2017-06-27 NOTE — Therapy (Signed)
[facility] [address] [place], [place], [address] Phone: [phone]   Fax:  [phone]  Physical Therapy Treatment  Patient Details  Name: Nicholas Gaines MRN: [id number] Date of Birth: 01/31/1929 Referring Provider: Butch PennyMegan Millikan, NP   Encounter Date: 06/27/2017  PT End of Session - 06/27/17 1205    Visit Number  3    Number of Visits  17    Date for PT Re-Evaluation  08/19/17    Authorization Type  [employer]; G code and PN every 10th visit    PT Start Time  1016    PT Stop Time  1100    PT Time Calculation (min)  44 min    Activity Tolerance  Patient tolerated treatment well    Behavior During Therapy  [facility] for tasks assessed/performed       Past Medical History:  Diagnosis Date  . A-fib (HCC)   . Arthritis   . GERD (gastroesophageal reflux disease)   . Heart murmur   . HLD (hyperlipidemia)   . Seizures (HCC)    last sz 04/17/17  . Stroke [facility](HCC)    tia    Past Surgical History:  Procedure Laterality Date  . CARPAL TUNNEL RELEASE     About 10 yr ago (08/13/16)  . Loop Recorder Insertion N/A 09/16/2016   Performed by Hillis RangeAllred, James, MD at [facility]  . TONSILLECTOMY      There were no vitals filed for this visit.  Subjective Assessment - 06/27/17 1203    Subjective  Pt reports no falls since last session. Pt states he has been doing his HEP/stretches.    Pertinent History  seizures, OA, afib, heart murmur, HLD, carpal tunnel release, and CVA with loop recorder insertion in 09/2016, multiple falls    Limitations  Walking    Patient Stated Goals  To improve his walking and improve his shoulder and leg strength    Currently in Pain?  No/denies         [facility]Ex[facility] - 06/27/17 0001      Balance   Balance Assessed  Yes      High Level Balance   High Level Balance Activities  Side stepping;Tandem walking    High Level Balance Comments  Side step  right/left and tandem walking forward/backward x20 each direction with Min guard to Min Assist for safety and VC's to correct technique and improve posture. Modified SLS (forefoot in lower counter) at counter with 10-second hold on Rt then Lt x3 each side. Pt performed with Supervision for safety and VC's for instruction.       Neuro Re-ed    Neuro Re-ed Details   Pt performed balance exercises in corner with chair in front for safety. Activities included standing on level and unlevel surface with wide BOS then narrow BOS, EO/EC, directional head turns incl. right/left, up/down and diagonal to improve balance and proprioception. Pt given min guard to Min Assist to correct slight imbalance.       Exercises   Exercises  Knee/Hip      Knee/Hip Exercises: Stretches   Active Hamstring Stretch  20 seconds;1 rep Reviewed from HEP. Continue to improve/increase stretch.    Piriformis Stretch  20 seconds;1 rep;Left;Right Reviewed from HEP. Mod stretch using stool.    Other Knee/Hip Stretches  Trunk rotation stretch, reviewed from HEP. Hold stretch 20 seconds each side.       Performed and demonstrated PWR! Exercises  in sitting, with max verbal and visual cues give. Pt presented with cognitive and physical limitations impaired patient's ability to safely perform this type of exercises. Patient was compensating with movement patterns due to arthritic limitations and these exercises appear could increase pain or facilitate other orthopedic problems. So PT recommended PWR! sitting not be included in pt HEP.        PT Short Term Goals - 06/20/17 1123      PT SHORT TERM GOAL #1   Title  Will perform HEP (PWR, stretch, strengthen, balance) with supervision of daughter    Time  4    Period  Weeks    Status  New    Target Date  07/20/17      PT SHORT TERM GOAL #2   Title  Will participate in further assessment of LE ROM (hamstrings and hip flexors) and will initiate stretching HEP for increased hip and  knee extension ROM    Time  4    Period  Weeks    Status  New    Target Date  07/20/17      PT SHORT TERM GOAL #3   Title  Will decrease five time sit to stand time to < or = 10 seconds from chair without UE support and will demonstrate increased terminal knee and hip extension during standing    Baseline  12.25 seconds    Time  4    Period  Weeks    Status  New    Target Date  07/20/17      PT SHORT TERM GOAL #4   Title  Will increase BERG balance score by 5 points from eval (41/56)    Baseline  41/56    Time  4    Period  Weeks    Status  New    Target Date  07/20/17      PT SHORT TERM GOAL #5   Title  Will increase gait speed to > or = 2.386ft/sec    Baseline  2.4 ft/sec    Time  4    Period  Weeks    Status  New    Target Date  07/20/17        PT Long Term Goals - 06/20/17 1129      PT LONG TERM GOAL #1   Title  Pt will demonstrate independence with HEP and will identify community wellness to participate in at D/C    Time  8    Period  Weeks    Status  New    Target Date  08/19/17      PT LONG TERM GOAL #2   Title  Will demonstrate 10 deg increase in hip and knee extension ROM    Baseline  TBD    Time  8    Period  Weeks    Status  New    Target Date  08/19/17      PT LONG TERM GOAL #3   Title  Will increase BERG balance score 10 points from initial assessment (41/56)    Baseline  41/56    Time  8    Period  Weeks    Status  New    Target Date  08/19/17      PT LONG TERM GOAL #4   Title  Will increase gait speed to > or = 3.0 ft/sec with improved foot clearance, trunk rotation, arm swing    Baseline  2.4 ft/sec    Time  8  Period  Weeks    Status  New    Target Date  08/19/17      PT LONG TERM GOAL #5   Title  Pt will perform floor > stand transfer with UE support and supervision    Baseline  unable    Time  8    Period  Weeks    Status  New    Target Date  08/19/17      Additional Long Term Goals   Additional Long Term Goals  Yes       PT LONG TERM GOAL #6   Title  Will ambulate x 500' over uneven outdoor surfaces and negotiate 4 stairs with L rail, alternating sequence MOD I with improved upright posture, increased knee and hip extension, decreased shuffling during gait    Time  8    Period  Weeks    Status  New    Target Date  08/19/17            Plan - 06/27/17 1207    Clinical Impression Statement  Reviewed and corrected technique with HEP stretching exercises. Attempted PWR! exercises in sitting, but patient struggled due to comprehension and physical limitations; did not add to HEP. Performed balance exercises to improve balance deficits and increase safety and functional independence. Pt tolerated treatment well and will benefit from further PT sessions.     Rehab Potential  Good    PT Frequency  2x / week    PT Duration  8 weeks    PT Treatment/Interventions  ADLs/Self Care Home Management;Gait training;Stair training;Functional mobility training;Therapeutic activities;Therapeutic exercise;Balance training;Neuromuscular re-education;Patient/family education;Passive range of motion;Manual techniques;Taping    PT Next Visit Plan  Add strength and balance to HEP. Sit/stand. Review stretch HEP.     PT Home Exercise Plan  Floor transfers when appropriate.    Consulted and Agree with Plan of Care  Patient       Patient will benefit from skilled therapeutic intervention in order to improve the following deficits and impairments:  Abnormal gait, Decreased balance, Decreased range of motion, Decreased strength, Difficulty walking, Impaired flexibility, Postural dysfunction, Pain  Visit Diagnosis: Other abnormalities of gait and mobility  Muscle weakness (generalized)  Unsteadiness on feet     Problem List Patient Active Problem List   Diagnosis Date Noted  . Palpitations 09/16/2016  . Anemia 09/09/2016  . Aortic valve calcification 09/09/2016  . Aortic valve sclerosis 09/09/2016  . BPH (benign prostatic  hyperplasia) 09/09/2016  . Cardiac murmur 09/09/2016  . Essential (primary) hypertension 09/09/2016  . Facial weakness status post cerebrovascular accident 09/09/2016  . H/O: stroke with residual effects 09/09/2016  . History of stroke 09/09/2016  . Prediabetes 09/09/2016  . Vitamin D deficiency 09/09/2016  . Alteration of consciousness 08/14/2016  . Acute encephalopathy 08/02/2016  . Difficulty speaking 08/02/2016  . Bronchitis 08/02/2016  . Mixed hyperlipidemia 08/02/2016  . Stroke (HCC)   . GERD (gastroesophageal reflux disease)   . Gastroesophageal reflux disease without esophagitis   . Transient cerebral ischemia    Kipp Laurence, SPTA 06/27/2017, 1:42 PM  Vladimir Faster, PT, DPT 06/27/2017, 4:08 PM  [facility] [address] [place], [place], [address] Phone: [phone]   Fax:  [phone]  Name: Nicholas Gaines MRN: [id number] Date of Birth: 1929-06-27

## 2017-06-29 ENCOUNTER — Ambulatory Visit: Payer: Medicare Other | Admitting: Adult Health

## 2017-07-04 ENCOUNTER — Ambulatory Visit: Payer: Medicare Other | Admitting: Physical Therapy

## 2017-07-04 ENCOUNTER — Encounter: Payer: Self-pay | Admitting: Physical Therapy

## 2017-07-04 DIAGNOSIS — M6281 Muscle weakness (generalized): Secondary | ICD-10-CM | POA: Diagnosis not present

## 2017-07-04 DIAGNOSIS — M25562 Pain in left knee: Secondary | ICD-10-CM

## 2017-07-04 DIAGNOSIS — M25561 Pain in right knee: Secondary | ICD-10-CM

## 2017-07-04 DIAGNOSIS — R2681 Unsteadiness on feet: Secondary | ICD-10-CM

## 2017-07-04 DIAGNOSIS — R2689 Other abnormalities of gait and mobility: Secondary | ICD-10-CM

## 2017-07-04 DIAGNOSIS — G8929 Other chronic pain: Secondary | ICD-10-CM

## 2017-07-04 DIAGNOSIS — R296 Repeated falls: Secondary | ICD-10-CM

## 2017-07-04 NOTE — Therapy (Signed)
[facility] [address] [place], [place], [address] Phone: [phone]   Fax:  [phone]  Physical Therapy Treatment  Patient Details  Name: Nicholas Gaines MRN: [id number] Date of Birth: June 09, 1929 Referring Provider: Butch Penny, NP   Encounter Date: 07/04/2017  PT End of Session - 07/04/17 1251    Visit Number  4    Number of Visits  17    Date for PT Re-Evaluation  08/19/17    Authorization Type  [employer]; G code and PN every 10th visit    PT Start Time  1015    PT Stop Time  1100    PT Time Calculation (min)  45 min    Activity Tolerance  Patient tolerated treatment well    Behavior During Therapy  [facility] for tasks assessed/performed       Past Medical History:  Diagnosis Date  . A-fib (HCC)   . Arthritis   . GERD (gastroesophageal reflux disease)   . Heart murmur   . HLD (hyperlipidemia)   . Seizures (HCC)    last sz 04/17/17  . Stroke [facility])    tia    Past Surgical History:  Procedure Laterality Date  . CARPAL TUNNEL RELEASE     About 10 yr ago (08/13/16)  . LOOP RECORDER INSERTION N/A 09/16/2016   Procedure: Loop Recorder Insertion;  Surgeon: Hillis Range, MD;  Location: [facility] INVASIVE CV LAB;  Service: Cardiovascular;  Laterality: N/A;  . TONSILLECTOMY      There were no vitals filed for this visit.  Subjective Assessment - 07/04/17 1023    Subjective  Continues to have no falls; no seizures with adjustment of seizure medication.      Pertinent History  seizures, OA, afib, heart murmur, HLD, carpal tunnel release, and CVA with loop recorder insertion in 09/2016, multiple falls    Limitations  Walking    Patient Stated Goals  To improve his walking and improve his shoulder and leg strength    Currently in Pain?  No/denies        *MODIFICATIONS:  No reaching with weight shift; knees bent with twist*    PWR [facility]) - 07/04/17 1105    PWR! Up  x 10 posture exercises with  verbal and tactile cues    PWR! Rock  x 10 without UE reaching    PWR! Twist  x 10 with knees bent    PWR! Step  x 10    Comments  with modifications          PT Education - 07/04/17 1250    Education provided  Yes    Education Details  PWR in supine    Person(s) Educated  Patient    Methods  Explanation;Demonstration;Handout    Comprehension  Need further instruction       PT Short Term Goals - 06/20/17 1123      PT SHORT TERM GOAL #1   Title  Will perform HEP (PWR, stretch, strengthen, balance) with supervision of daughter    Time  4    Period  Weeks    Status  New    Target Date  07/20/17      PT SHORT TERM GOAL #2   Title  Will participate in further assessment of LE ROM (hamstrings and hip flexors) and will initiate stretching HEP for increased hip and knee extension ROM    Time  4    Period  Weeks    Status  New    Target Date  07/20/17      PT SHORT TERM GOAL #3   Title  Will decrease five time sit to stand time to < or = 10 seconds from chair without UE support and will demonstrate increased terminal knee and hip extension during standing    Baseline  12.25 seconds    Time  4    Period  Weeks    Status  New    Target Date  07/20/17      PT SHORT TERM GOAL #4   Title  Will increase BERG balance score by 5 points from eval (41/56)    Baseline  41/56    Time  4    Period  Weeks    Status  New    Target Date  07/20/17      PT SHORT TERM GOAL #5   Title  Will increase gait speed to > or = 2.866ft/sec    Baseline  2.4 ft/sec    Time  4    Period  Weeks    Status  New    Target Date  07/20/17        PT Long Term Goals - 06/20/17 1129      PT LONG TERM GOAL #1   Title  Pt will demonstrate independence with HEP and will identify community wellness to participate in at D/C    Time  8    Period  Weeks    Status  New    Target Date  08/19/17      PT LONG TERM GOAL #2   Title  Will demonstrate 10 deg increase in hip and knee extension ROM    Baseline   TBD    Time  8    Period  Weeks    Status  New    Target Date  08/19/17      PT LONG TERM GOAL #3   Title  Will increase BERG balance score 10 points from initial assessment (41/56)    Baseline  41/56    Time  8    Period  Weeks    Status  New    Target Date  08/19/17      PT LONG TERM GOAL #4   Title  Will increase gait speed to > or = 3.0 ft/sec with improved foot clearance, trunk rotation, arm swing    Baseline  2.4 ft/sec    Time  8    Period  Weeks    Status  New    Target Date  08/19/17      PT LONG TERM GOAL #5   Title  Pt will perform floor > stand transfer with UE support and supervision    Baseline  unable    Time  8    Period  Weeks    Status  New    Target Date  08/19/17      Additional Long Term Goals   Additional Long Term Goals  Yes      PT LONG TERM GOAL #6   Title  Will ambulate x 500' over uneven outdoor surfaces and negotiate 4 stairs with L rail, alternating sequence MOD I with improved upright posture, increased knee and hip extension, decreased shuffling during gait    Time  8    Period  Weeks    Status  New    Target Date  08/19/17            Plan - 07/04/17  1251    Clinical Impression Statement  Performed PWR in sitting to determine patient's main limitations; due to significant postural limitations and UE ROM limitations/weakness transitioned to supine to initiate PWR! exercises.  Performed PWR in supine with minor adjustments to exercises including: omiting reaching for weight shifting and knees bent for twist.  At end of session pt reported feeling like he was able to pick his feet up better after performing supine exercises.  Will continue to address and progress.      Rehab Potential  Good    PT Frequency  2x / week    PT Duration  8 weeks    PT Treatment/Interventions  ADLs/Self Care Home Management;Gait training;Stair training;Functional mobility training;Therapeutic activities;Therapeutic exercise;Balance training;Neuromuscular  re-education;Patient/family education;Passive range of motion;Manual techniques;Taping    PT Next Visit Plan  Review SUPINE PWR with modifications.  Continue postural exercises to stretch anterior chest, strengthen posterior shoulders, strengthen thoracic extension, knee and hip extensors.  Standing balance training: weight shifting, foot clearance    PT Home Exercise Plan  Floor transfers when appropriate.    Consulted and Agree with Plan of Care  Patient       Patient will benefit from skilled therapeutic intervention in order to improve the following deficits and impairments:  Abnormal gait, Decreased balance, Decreased range of motion, Decreased strength, Difficulty walking, Impaired flexibility, Postural dysfunction, Pain  Visit Diagnosis: Other abnormalities of gait and mobility  Muscle weakness (generalized)  Unsteadiness on feet  Repeated falls  Chronic pain of left knee  Chronic pain of right knee     Problem List Patient Active Problem List   Diagnosis Date Noted  . Palpitations 09/16/2016  . Anemia 09/09/2016  . Aortic valve calcification 09/09/2016  . Aortic valve sclerosis 09/09/2016  . BPH (benign prostatic hyperplasia) 09/09/2016  . Cardiac murmur 09/09/2016  . Essential (primary) hypertension 09/09/2016  . Facial weakness status post cerebrovascular accident 09/09/2016  . H/O: stroke with residual effects 09/09/2016  . History of stroke 09/09/2016  . Prediabetes 09/09/2016  . Vitamin D deficiency 09/09/2016  . Alteration of consciousness 08/14/2016  . Acute encephalopathy 08/02/2016  . Difficulty speaking 08/02/2016  . Bronchitis 08/02/2016  . Mixed hyperlipidemia 08/02/2016  . Stroke (HCC)   . GERD (gastroesophageal reflux disease)   . Gastroesophageal reflux disease without esophagitis   . Transient cerebral ischemia    Dierdre HighmanAudra F Cary Lothrop, PT, DPT 07/04/17    1:08 PM    [facility] [address] [place], [place], [address] Phone: [phone]   Fax:  [phone]  Name: Lucendia Herrlichllen Puopolo MRN: [id number] Date of Birth: 12/12/1928

## 2017-07-04 NOTE — Telephone Encounter (Signed)
Patient's daughter never received a call back regarding adjusting the patient's medication. If VM please leave a detailed message.

## 2017-07-05 NOTE — Telephone Encounter (Addendum)
Spoke to daughter,Pamela on DPR who stated that she had discussed with Megan decreasing Vimpat and Keppra medications on 06/09/17. She stated her father is still drowsy but has not fallen since decreasing Vimpat dose to 100 mg twice daily. He continues to take Keppra 500 mg twice daily. She denies any seizure activity at all in patient. Rinaldo Cloudamela would like to know what steps are next for decreasing these medications. . This RN stated will discuss with NP and call her back this afternoon. Rinaldo Cloudamela verbalized understanding, appreciation.

## 2017-07-05 NOTE — Telephone Encounter (Signed)
I called and left a message for daughter Rinaldo Cloudamela.

## 2017-07-06 NOTE — Telephone Encounter (Signed)
I called the patient's daughter.  She reports that since we decreased Vimpat he has not had any additional falls.  She would like us to decrease Keppra.  We will decrease the dose to 250 mg in the morning and 500 mg in the for the next 3-4 weeks.  If the patient tolerates this well we will decrease the dose down to 250 mg twice a day.  I have advised that adjusting the dose puts the patient at risk for possible seizure event.  She voiced understanding.  She will call in 3-4 weeks to give us an update.

## 2017-07-08 ENCOUNTER — Encounter: Payer: Self-pay | Admitting: Physical Therapy

## 2017-07-08 ENCOUNTER — Ambulatory Visit: Payer: Medicare Other | Admitting: Physical Therapy

## 2017-07-08 DIAGNOSIS — M25561 Pain in right knee: Secondary | ICD-10-CM | POA: Diagnosis not present

## 2017-07-08 DIAGNOSIS — R2689 Other abnormalities of gait and mobility: Secondary | ICD-10-CM | POA: Diagnosis not present

## 2017-07-08 DIAGNOSIS — R296 Repeated falls: Secondary | ICD-10-CM | POA: Diagnosis not present

## 2017-07-08 DIAGNOSIS — G8929 Other chronic pain: Secondary | ICD-10-CM | POA: Diagnosis not present

## 2017-07-08 DIAGNOSIS — R2681 Unsteadiness on feet: Secondary | ICD-10-CM | POA: Diagnosis not present

## 2017-07-08 DIAGNOSIS — M25562 Pain in left knee: Secondary | ICD-10-CM | POA: Diagnosis not present

## 2017-07-08 DIAGNOSIS — M6281 Muscle weakness (generalized): Secondary | ICD-10-CM

## 2017-07-08 NOTE — Therapy (Signed)
[facility] [address] [place], [place], [address] Phone: [phone]   Fax:  [phone]  Physical Therapy Treatment  Patient Details  Name: Nicholas Gaines MRN: [id number] Date of Birth: 04/20/1929 Referring Provider: Butch PennyMegan Millikan, NP   Encounter Date: 07/08/2017  PT End of Session - 07/08/17 1705    Visit Number  5    Number of Visits  17    Date for PT Re-Evaluation  08/19/17    Authorization Type  [employer]; G code and PN every 10th visit    PT Start Time  1147    PT Stop Time  1230    PT Time Calculation (min)  43 min    Equipment Utilized During Treatment  Gait belt    Activity Tolerance  Patient tolerated treatment well    Behavior During Therapy  [facility] for tasks assessed/performed       Past Medical History:  Diagnosis Date  . A-fib (HCC)   . Arthritis   . GERD (gastroesophageal reflux disease)   . Heart murmur   . HLD (hyperlipidemia)   . Seizures (HCC)    last sz 04/17/17  . Stroke [facility](HCC)    tia    Past Surgical History:  Procedure Laterality Date  . CARPAL TUNNEL RELEASE     About 10 yr ago (08/13/16)  . LOOP RECORDER INSERTION N/A 09/16/2016   Procedure: Loop Recorder Insertion;  Surgeon: Hillis RangeJames Allred, MD;  Location: [facility] INV[facility] CV LAB;  Service: Cardiovascular;  Laterality: N/A;  . TONSILLECTOMY      There were no vitals filed for this visit.  Subjective Assessment - 07/08/17 1653    Subjective  Doing well. No falls. "I will do whatever you think is best if it will help me get stronger and better walking"    Pertinent History  seizures, OA, afib, heart murmur, HLD, carpal tunnel release, and CVA with loop recorder insertion in 09/2016, multiple falls    Limitations  Walking    Patient Stated Goals  To improve his walking and improve his shoulder and leg strength    Currently in Pain?  No/denies                      [facility]/Exercise -  07/08/17 1658      Transfers   Transfers  Sit to Stand;Stand to Sit    Sit to Stand  7: Independent    Stand to Sit  5: Supervision vc to slow descent and not use UEs    Number of Reps  10 reps;1 set      Ambulation/Gait   Ambulation/Gait  Yes    Ambulation/Gait Assistance  5: Supervision    Ambulation/Gait Assistance Details  patient with difficulty performing heel strike initially with progression to good heelstrike and no longer dragging feet    Ambulation Distance (Feet)  480 Feet    Assistive device  None    Gait Pattern  Step-through pattern;Decreased arm swing - right;Decreased arm swing - left;Decreased weight shift to right;Decreased weight shift to left;Right flexed knee in stance;Left flexed knee in stance;Shuffle;Decreased trunk rotation;Trunk flexed;Poor foot clearance - left;Poor foot clearance - right;Decreased stride length;Right foot flat;Left foot flat    Ambulation Surface  Indoor      Knee/Hip Exercises: Standing   Knee Flexion  --    Hip Flexion  Stengthening;Both;1 set;10 reps;Knee bent light tap to step, then further hip flexion, return to  tap    Hip Extension  Stengthening;Both;1 set;10 reps;Knee bent pt with difficulty following max vc and visual cues    Step Down  Both;1 set;10 reps;Step Height: 4"    Other Standing Knee Exercises  backwards step up 4" step bil x 10 reps each, single UE support        PWR [facility] - 07/08/17 1654    PWR! Rock  x 10 with pt reaching across his body (as precursor to rolling)    PWR! Twist  x 10 knees bent    PWR! Step  x 5    Comments  pt requires max verbal and tactile cues for each exercise; pt with great difficulty understanding PWR step       Balance Exercises - 07/08/17 1703      Balance Exercises: Standing   Retro Gait  Upper extremity support;4 reps at counter      OTAGO PROGRAM   Sideways Walking  No assistive device    Toe Walk  Support          PT Short Term Goals - 06/20/17 1123      PT SHORT TERM  GOAL #1   Title  Will perform HEP (PWR, stretch, strengthen, balance) with supervision of daughter    Time  4    Period  Weeks    Status  New    Target Date  07/20/17      PT SHORT TERM GOAL #2   Title  Will participate in further assessment of LE ROM (hamstrings and hip flexors) and will initiate stretching HEP for increased hip and knee extension ROM    Time  4    Period  Weeks    Status  New    Target Date  07/20/17      PT SHORT TERM GOAL #3   Title  Will decrease five time sit to stand time to < or = 10 seconds from chair without UE support and will demonstrate increased terminal knee and hip extension during standing    Baseline  12.25 seconds    Time  4    Period  Weeks    Status  New    Target Date  07/20/17      PT SHORT TERM GOAL #4   Title  Will increase BERG balance score by 5 points from eval (41/56)    Baseline  41/56    Time  4    Period  Weeks    Status  New    Target Date  07/20/17      PT SHORT TERM GOAL #5   Title  Will increase gait speed to > or = 2.756ft/sec    Baseline  2.4 ft/sec    Time  4    Period  Weeks    Status  New    Target Date  07/20/17        PT Long Term Goals - 06/20/17 1129      PT LONG TERM GOAL #1   Title  Pt will demonstrate independence with HEP and will identify community wellness to participate in at D/C    Time  8    Period  Weeks    Status  New    Target Date  08/19/17      PT LONG TERM GOAL #2   Title  Will demonstrate 10 deg increase in hip and knee extension ROM    Baseline  TBD    Time  8  Period  Weeks    Status  New    Target Date  08/19/17      PT LONG TERM GOAL #3   Title  Will increase BERG balance score 10 points from initial assessment (41/56)    Baseline  41/56    Time  8    Period  Weeks    Status  New    Target Date  08/19/17      PT LONG TERM GOAL #4   Title  Will increase gait speed to > or = 3.0 ft/sec with improved foot clearance, trunk rotation, arm swing    Baseline  2.4 ft/sec     Time  8    Period  Weeks    Status  New    Target Date  08/19/17      PT LONG TERM GOAL #5   Title  Pt will perform floor > stand transfer with UE support and supervision    Baseline  unable    Time  8    Period  Weeks    Status  New    Target Date  08/19/17      Additional Long Term Goals   Additional Long Term Goals  Yes      PT LONG TERM GOAL #6   Title  Will ambulate x 500' over uneven outdoor surfaces and negotiate 4 stairs with L rail, alternating sequence MOD I with improved upright posture, increased knee and hip extension, decreased shuffling during gait    Time  8    Period  Weeks    Status  New    Target Date  08/19/17            Plan - 07/08/17 1438    Clinical Impression Statement  Session focused on review of supine PWR exercises with pt demonstrating great difficulty following instructions and seemingly no recall of previous session when he was instructed in these exercises. Remainder of session focused on balance, strength, and gait training. Patient with good ability to alter his gait with instructional cues and maintain improvement up to 50 ft at a time. Anticipate he can continue to benefit from PT.     Rehab Potential  Good    PT Frequency  2x / week    PT Duration  8 weeks    PT Treatment/Interventions  ADLs/Self Care Home Management;Gait training;Stair training;Functional mobility training;Therapeutic activities;Therapeutic exercise;Balance training;Neuromuscular re-education;Patient/family education;Passive range of motion;Manual techniques;Taping    PT Next Visit Plan  ? try Review SUPINE PWR with modifications again (poor memory for these 11/30).  Continue postural exercises to stretch anterior chest, strengthen posterior shoulders, strengthen thoracic extension, knee and hip extensors.  Standing balance training: weight shifting, foot clearance    PT Home Exercise Plan  Floor transfers when appropriate.    Consulted and Agree with Plan of Care  Patient        Patient will benefit from skilled therapeutic intervention in order to improve the following deficits and impairments:  Abnormal gait, Decreased balance, Decreased range of motion, Decreased strength, Difficulty walking, Impaired flexibility, Postural dysfunction, Pain  Visit Diagnosis: Other abnormalities of gait and mobility  Muscle weakness (generalized)  Unsteadiness on feet     Problem List Patient Active Problem List   Diagnosis Date Noted  . Palpitations 09/16/2016  . Anemia 09/09/2016  . Aortic valve calcification 09/09/2016  . Aortic valve sclerosis 09/09/2016  . BPH (benign prostatic hyperplasia) 09/09/2016  . Cardiac murmur 09/09/2016  . Essential (primary)  hypertension 09/09/2016  . Facial weakness status post cerebrovascular accident 09/09/2016  . H/O: stroke with residual effects 09/09/2016  . History of stroke 09/09/2016  . Prediabetes 09/09/2016  . Vitamin D deficiency 09/09/2016  . Alteration of consciousness 08/14/2016  . Acute encephalopathy 08/02/2016  . Difficulty speaking 08/02/2016  . Bronchitis 08/02/2016  . Mixed hyperlipidemia 08/02/2016  . Stroke (HCC)   . GERD (gastroesophageal reflux disease)   . Gastroesophageal reflux disease without esophagitis   . Transient cerebral ischemia     Zena Amos ,  PT 07/08/2017, 5:12 PM  [place] [facility] [address] [place], [place], [address] Phone: [phone]   Fax:  [phone]  Name: Ibraham Levi MRN: [id number] Date of Birth: 21-Jan-1929

## 2017-07-11 ENCOUNTER — Ambulatory Visit: Payer: Medicare Other | Attending: Adult Health | Admitting: Physical Therapy

## 2017-07-11 ENCOUNTER — Encounter: Payer: Self-pay | Admitting: Physical Therapy

## 2017-07-11 DIAGNOSIS — G8929 Other chronic pain: Secondary | ICD-10-CM | POA: Diagnosis not present

## 2017-07-11 DIAGNOSIS — M25562 Pain in left knee: Secondary | ICD-10-CM | POA: Insufficient documentation

## 2017-07-11 DIAGNOSIS — M6281 Muscle weakness (generalized): Secondary | ICD-10-CM | POA: Insufficient documentation

## 2017-07-11 DIAGNOSIS — R296 Repeated falls: Secondary | ICD-10-CM | POA: Diagnosis not present

## 2017-07-11 DIAGNOSIS — M25561 Pain in right knee: Secondary | ICD-10-CM | POA: Insufficient documentation

## 2017-07-11 DIAGNOSIS — R2681 Unsteadiness on feet: Secondary | ICD-10-CM | POA: Diagnosis not present

## 2017-07-11 DIAGNOSIS — R2689 Other abnormalities of gait and mobility: Secondary | ICD-10-CM | POA: Diagnosis not present

## 2017-07-11 NOTE — Therapy (Signed)
[facility] [address] [place], [place], [address] Phone: [phone]   Fax:  [phone]  Physical Therapy Treatment  Patient Details  Name: Nicholas Gaines MRN: [id number] Date of Birth: 11/24/1928 Referring Provider: Butch PennyMegan Millikan, NP   Encounter Date: 07/11/2017  PT End of Session - 07/11/17 1241    Visit Number  6    Number of Visits  17    Date for PT Re-Evaluation  08/19/17    Authorization Type  [employer]; G code and PN every 10th visit    PT Start Time  1147    PT Stop Time  1230    PT Time Calculation (min)  43 min    Equipment Utilized During Treatment  Gait belt    Activity Tolerance  Patient tolerated treatment well    Behavior During Therapy  [facility] for tasks assessed/performed       Past Medical History:  Diagnosis Date  . A-fib (HCC)   . Arthritis   . GERD (gastroesophageal reflux disease)   . Heart murmur   . HLD (hyperlipidemia)   . Seizures (HCC)    last sz 04/17/17  . Stroke [facility](HCC)    tia    Past Surgical History:  Procedure Laterality Date  . CARPAL TUNNEL RELEASE     About 10 yr ago (08/13/16)  . LOOP RECORDER INSERTION N/A 09/16/2016   Procedure: Loop Recorder Insertion;  Surgeon: Hillis RangeJames Allred, MD;  [facility];  Service: Cardiovascular;  Laterality: N/A;  . TONSILLECTOMY      There were no vitals filed for this visit.  Subjective Assessment - 07/11/17 1238    Subjective  Pt states he feels he is walking better lately. Pt has no complaints and is not having any pain.    Pertinent History  seizures, OA, afib, heart murmur, HLD, carpal tunnel release, and CVA with loop recorder insertion in 09/2016, multiple falls    Limitations  Walking    Patient Stated Goals  To improve his walking and improve his shoulder and leg strength    Currently in Pain?  No/denies       [facility] - 07/11/17 0001      Transfers   Transfers  --     Sit to Stand  --    Stand to Sit  --      Ambulation/Gait   Ambulation/Gait  Yes    Ambulation/Gait Assistance  5: Supervision    Ambulation/Gait Assistance Details  Pt has improved with heel strike on Rt. Pt fatigued toward end of session and increased dragging with both feet.    Ambulation Distance (Feet)  250 Feet 115 ft., then 70 then 65    Assistive device  None    Gait Pattern  Step-through pattern;Decreased arm swing - right;Decreased arm swing - left;Decreased weight shift to right;Decreased weight shift to left;Right flexed knee in stance;Left flexed knee in stance;Shuffle;Decreased trunk rotation;Trunk flexed;Poor foot clearance - left;Poor foot clearance - right;Decreased stride length;Right foot flat;Left foot flat    Ambulation Surface  Level;Indoor      Neuro Re-ed    Neuro Re-ed Details   Pt performed balance exercises in parallel bars, beginning with feet together, then with stagger stance right foot forward and left foot forward; EC; directional head turns to right/left, up/down and diagonal.         Pt performed weight shift stepping on ramp alternating step forward/backward right, then forward/backward  left on descent; then stepping backward/forward right and backward/forward left on ascent. VC's and demonstration needed, Min A given for safety.    Pt performed standing on rocker board with balance in center, and weight shift right/left and forward/backward; EO then EC; directional head turns. Pt given Min Assist and used bars for minimal UE support for safety and balance correction.      Pt performed step over activities to improve Rt foot clearance and focus on heel strike.       Exercises   Exercises  --      Knee/Hip Exercises: Standing   Hip Flexion  --    Hip Extension  --    Step Down  --    Other Standing Knee Exercises  --               PT Short Term Goals - 06/20/17 1123      PT SHORT TERM GOAL #1   Title  Will perform HEP (PWR, stretch,  strengthen, balance) with supervision of daughter    Time  4    Period  Weeks    Status  New    Target Date  07/20/17      PT SHORT TERM GOAL #2   Title  Will participate in further assessment of LE ROM (hamstrings and hip flexors) and will initiate stretching HEP for increased hip and knee extension ROM    Time  4    Period  Weeks    Status  New    Target Date  07/20/17      PT SHORT TERM GOAL #3   Title  Will decrease five time sit to stand time to < or = 10 seconds from chair without UE support and will demonstrate increased terminal knee and hip extension during standing    Baseline  12.25 seconds    Time  4    Period  Weeks    Status  New    Target Date  07/20/17      PT SHORT TERM GOAL #4   Title  Will increase BERG balance score by 5 points from eval (41/56)    Baseline  41/56    Time  4    Period  Weeks    Status  New    Target Date  07/20/17      PT SHORT TERM GOAL #5   Title  Will increase gait speed to > or = 2.22ft/sec    Baseline  2.4 ft/sec    Time  4    Period  Weeks    Status  New    Target Date  07/20/17        PT Long Term Goals - 06/20/17 1129      PT LONG TERM GOAL #1   Title  Pt will demonstrate independence with HEP and will identify community wellness to participate in at D/C    Time  8    Period  Weeks    Status  New    Target Date  08/19/17      PT LONG TERM GOAL #2   Title  Will demonstrate 10 deg increase in hip and knee extension ROM    Baseline  TBD    Time  8    Period  Weeks    Status  New    Target Date  08/19/17      PT LONG TERM GOAL #3   Title  Will increase BERG balance score 10 points from  initial assessment (41/56)    Baseline  41/56    Time  8    Period  Weeks    Status  New    Target Date  08/19/17      PT LONG TERM GOAL #4   Title  Will increase gait speed to > or = 3.0 ft/sec with improved foot clearance, trunk rotation, arm swing    Baseline  2.4 ft/sec    Time  8    Period  Weeks    Status  New    Target  Date  08/19/17      PT LONG TERM GOAL #5   Title  Pt will perform floor > stand transfer with UE support and supervision    Baseline  unable    Time  8    Period  Weeks    Status  New    Target Date  08/19/17      Additional Long Term Goals   Additional Long Term Goals  Yes      PT LONG TERM GOAL #6   Title  Will ambulate x 500' over uneven outdoor surfaces and negotiate 4 stairs with L rail, alternating sequence MOD I with improved upright posture, increased knee and hip extension, decreased shuffling during gait    Time  8    Period  Weeks    Status  New    Target Date  08/19/17            Plan - 07/11/17 1243    Clinical Impression Statement  Session focused on balance and gait training. Pt has weakness in RLE, and will benefit from continued training to increase Rt foot clearance, strengthen RLE, and improve balance shifting weight to RLE.     Rehab Potential  Good    PT Frequency  2x / week    PT Duration  8 weeks    PT Treatment/Interventions  ADLs/Self Care Home Management;Gait training;Stair training;Functional mobility training;Therapeutic activities;Therapeutic exercise;Balance training;Neuromuscular re-education;Patient/family education;Passive range of motion;Manual techniques;Taping    PT Next Visit Plan  Standing balance training: weight shift, foot clearance. Continue postural exercises to stretch anterior chest, strengthen posterior shoulders, strengthen thoracic extension, knee and hip extensors. Walk outdoors    PT Home Exercise Plan  Floor transfers when appropriate.    Consulted and Agree with Plan of Care  Patient       Patient will benefit from skilled therapeutic intervention in order to improve the following deficits and impairments:  Abnormal gait, Decreased balance, Decreased range of motion, Decreased strength, Difficulty walking, Impaired flexibility, Postural dysfunction, Pain  Visit Diagnosis: Other abnormalities of gait and mobility  Muscle  weakness (generalized)  Unsteadiness on feet     Problem List Patient Active Problem List   Diagnosis Date Noted  . Palpitations 09/16/2016  . Anemia 09/09/2016  . Aortic valve calcification 09/09/2016  . Aortic valve sclerosis 09/09/2016  . BPH (benign prostatic hyperplasia) 09/09/2016  . Cardiac murmur 09/09/2016  . Essential (primary) hypertension 09/09/2016  . Facial weakness status post cerebrovascular accident 09/09/2016  . H/O: stroke with residual effects 09/09/2016  . History of stroke 09/09/2016  . Prediabetes 09/09/2016  . Vitamin D deficiency 09/09/2016  . Alteration of consciousness 08/14/2016  . Acute encephalopathy 08/02/2016  . Difficulty speaking 08/02/2016  . Bronchitis 08/02/2016  . Mixed hyperlipidemia 08/02/2016  . Stroke (HCC)   . GERD (gastroesophageal reflux disease)   . Gastroesophageal reflux disease without esophagitis   . Transient cerebral ischemia  Kipp Laurence, [place]  07/11/2017, 4:09 PM  [facility] [address] [place], [place], [address] Phone: [phone]   Fax:  [phone]  Name: Nicholas Gaines MRN: [id number] Date of Birth: 06-26-1929

## 2017-07-13 ENCOUNTER — Ambulatory Visit (INDEPENDENT_AMBULATORY_CARE_PROVIDER_SITE_OTHER): Payer: Medicare Other | Admitting: *Deleted

## 2017-07-13 DIAGNOSIS — R002 Palpitations: Secondary | ICD-10-CM | POA: Diagnosis not present

## 2017-07-14 ENCOUNTER — Ambulatory Visit: Payer: Medicare Other | Admitting: Physical Therapy

## 2017-07-14 ENCOUNTER — Encounter: Payer: Self-pay | Admitting: Physical Therapy

## 2017-07-14 DIAGNOSIS — R2689 Other abnormalities of gait and mobility: Secondary | ICD-10-CM

## 2017-07-14 DIAGNOSIS — R2681 Unsteadiness on feet: Secondary | ICD-10-CM

## 2017-07-14 DIAGNOSIS — M6281 Muscle weakness (generalized): Secondary | ICD-10-CM | POA: Diagnosis not present

## 2017-07-14 DIAGNOSIS — M25561 Pain in right knee: Secondary | ICD-10-CM | POA: Diagnosis not present

## 2017-07-14 DIAGNOSIS — M25562 Pain in left knee: Secondary | ICD-10-CM | POA: Diagnosis not present

## 2017-07-14 DIAGNOSIS — R296 Repeated falls: Secondary | ICD-10-CM | POA: Diagnosis not present

## 2017-07-14 DIAGNOSIS — G8929 Other chronic pain: Secondary | ICD-10-CM | POA: Diagnosis not present

## 2017-07-14 NOTE — Progress Notes (Signed)
Carelink Summary Report / Loop Recorder 

## 2017-07-14 NOTE — Patient Instructions (Addendum)
  Functional Quadriceps: Sit to Stand    Sit on edge of chair, feet flat on floor. Lean forward and stand upright, extending knees fully.  Slowly return to sitting Repeat __10__ times per set. Do __1-2__  sessions per day.  http://orth.exer.us/734   Copyright  VHI. All rights reserved.

## 2017-07-14 NOTE — Therapy (Signed)
[facility] [address] [place], [place], [address] Phone: [phone]   Fax:  [phone]  Physical Therapy Treatment  Patient Details  Name: Nicholas Gaines MRN: [id number] Date of Birth: Aug 22, 1928 Referring Provider: Ward Givens, NP   Encounter Date: 07/14/2017  PT End of Session - 07/14/17 1201    Visit Number  7    Number of Visits  17    Date for PT Re-Evaluation  08/19/17    Authorization Type  [employer]; G code and PN every 10th visit    PT Start Time  1104    PT Stop Time  1147    PT Time Calculation (min)  43 min    Activity Tolerance  Patient tolerated treatment well    Behavior During Therapy  [facility] for tasks assessed/performed       Past Medical History:  Diagnosis Date  . A-fib ([place])   . Arthritis   . GERD (gastroesophageal reflux disease)   . Heart murmur   . HLD (hyperlipidemia)   . Seizures ([place])    last sz 04/17/17  . Stroke [facility])    tia    Past Surgical History:  Procedure Laterality Date  . CARPAL TUNNEL RELEASE     About 10 yr ago (08/13/16)  . LOOP RECORDER INSERTION N/A 09/16/2016   Procedure: Loop Recorder Insertion;  Surgeon: Sigl Grayer, MD;  Location: [place] [facility];  Service: Cardiovascular;  Laterality: N/A;  . TONSILLECTOMY      There were no vitals filed for this visit.  Subjective Assessment - 07/14/17 1108    Subjective  Feel like I'm still walking a little better, but my legs still feel weak.      Pertinent History  seizures, OA, afib, heart murmur, HLD, carpal tunnel release, and CVA with loop recorder insertion in 09/2016, multiple falls    Limitations  Walking    Patient Stated Goals  To improve his walking and improve his shoulder and leg strength    Currently in Pain?  No/denies                      [facility] Adult PT Treatment/Exercise - 07/14/17 0001      Transfers   Transfers  Sit to Stand;Stand to Sit    Sit to Stand  7:  Independent    Five time sit to stand comments   12.03    Stand to Sit  5: Supervision    Number of Reps  10 reps;2 sets from mat, then 18" chair surface    Comments  Focused on repeated sit<>stand for functional lower extremity strengthening.  Cues for positioning, upright posture upon standing.  Also practice squats to pick up objects from floor, as pt reports having difficulty due to feeling weak upon trying to stand up from squatting;  practiced squatting to pick up object x 5 reps, with cues for widened BOS and positioning himself directly over object (versus reaching forward to pick it up)      Ambulation/Gait   Ambulation/Gait  Yes    Ambulation/Gait Assistance  5: Supervision    Ambulation/Gait Assistance Details  Cues for heelstrike with gait    Ambulation Distance (Feet)  120 Feet x 2; then 150    Assistive device  None    Gait Pattern  Step-through pattern;Decreased arm swing - right;Decreased arm swing - left;Decreased weight shift to right;Decreased weight shift to left;Right flexed knee in stance;Left  flexed knee in stance;Shuffle;Decreased trunk rotation;Trunk flexed;Poor foot clearance - left;Poor foot clearance - right;Decreased stride length;Right foot flat;Left foot flat    Ambulation Surface  Level;Indoor    Gait velocity  14.84 (2.21 ft/sec)      Standardized Balance Assessment   Standardized Balance Assessment  Berg Balance Test      Berg Balance Test   Sit to Stand  Able to stand without using hands and stabilize independently    Standing Unsupported  Able to stand safely 2 minutes    Sitting with Back Unsupported but Feet Supported on Floor or Stool  Able to sit safely and securely 2 minutes    Stand to Sit  Sits safely with minimal use of hands    Transfers  Able to transfer safely, minor use of hands    Standing Unsupported with Eyes Closed  Able to stand 10 seconds safely    Standing Ubsupported with Feet Together  Able to place feet together independently and  stand 1 minute safely    From Standing, Reach Forward with Outstretched Arm  Can reach forward >12 cm safely (5")    From Standing Position, Pick up Object from Floor  Able to pick up shoe safely and easily    From Standing Position, Turn to Look Behind Over each Shoulder  Turn sideways only but maintains balance    Turn 360 Degrees  Able to turn 360 degrees safely but slowly    Standing Unsupported, Alternately Place Feet on Step/Stool  Able to stand independently and safely and complete 8 steps in 20 seconds    Standing Unsupported, One Foot in Front  Able to plae foot ahead of the other independently and hold 30 seconds    Standing on One Leg  Tries to lift leg/unable to hold 3 seconds but remains standing independently    Total Score  47      High Level Balance   High Level Balance Comments  In parallel bars:  marching in place x 10 (cues for high leg lift); heel/toe raises x 10 reps, then forward step and weightshift x 10 reps, side step and weightshift x 10 reps (cues for improved foot clearance);  attempted back step and weightshift x 3 reps with pt having difficulty sequencing posterior step.      Exercises   Exercises  Knee/Hip      Knee/Hip Exercises: Stretches   Active Hamstring Stretch  Right;Left;3 reps;20 seconds Cues to hold for 20 seconds    Piriformis Stretch  Right;Left;2 reps;20 seconds Cues to gently push knee out and down for incr. stretch             PT Education - 07/14/17 1201    Education provided  Yes    Education Details  Added repeated sit<>stand to HEP for functional lower extremity strengthening    Person(s) Educated  Patient    Methods  Explanation;Demonstration;Verbal cues;Handout    Comprehension  Verbalized understanding;Returned demonstration;Verbal cues required;Need further instruction       PT Short Term Goals - 07/14/17 1204      PT SHORT TERM GOAL #1   Title  Will perform HEP (PWR, stretch, strengthen, balance) with supervision of  daughter    Time  4    Period  Weeks    Status  New      PT SHORT TERM GOAL #2   Title  Will participate in further assessment of LE ROM (hamstrings and hip flexors) and will initiate stretching HEP  for increased hip and knee extension ROM    Time  4    Period  Weeks    Status  New      PT SHORT TERM GOAL #3   Title  Will decrease five time sit to stand time to < or = 10 seconds from chair without UE support and will demonstrate increased terminal knee and hip extension during standing    Baseline  12.03 sec 07/14/17    Time  4    Period  Weeks    Status  Not Met      PT SHORT TERM GOAL #4   Title  Will increase BERG balance score by 5 points from eval (41/56)    Baseline  47/56 07/14/17    Time  4    Period  Weeks    Status  Achieved      PT SHORT TERM GOAL #5   Title  Will increase gait speed to > or = 2.48f/sec    Baseline  2.21 ft/sec 07/14/17    Time  4    Period  Weeks    Status  Not Met        PT Long Term Goals - 06/20/17 1129      PT LONG TERM GOAL #1   Title  Pt will demonstrate independence with HEP and will identify community wellness to participate in at D/C    Time  8    Period  Weeks    Status  New    Target Date  08/19/17      PT LONG TERM GOAL #2   Title  Will demonstrate 10 deg increase in hip and knee extension ROM    Baseline  TBD    Time  8    Period  Weeks    Status  New    Target Date  08/19/17      PT LONG TERM GOAL #3   Title  Will increase BERG balance score 10 points from initial assessment (41/56)    Baseline  41/56    Time  8    Period  Weeks    Status  New    Target Date  08/19/17      PT LONG TERM GOAL #4   Title  Will increase gait speed to > or = 3.0 ft/sec with improved foot clearance, trunk rotation, arm swing    Baseline  2.4 ft/sec    Time  8    Period  Weeks    Status  New    Target Date  08/19/17      PT LONG TERM GOAL #5   Title  Pt will perform floor > stand transfer with UE support and supervision    Baseline   unable    Time  8    Period  Weeks    Status  New    Target Date  08/19/17      Additional Long Term Goals   Additional Long Term Goals  Yes      PT LONG TERM GOAL #6   Title  Will ambulate x 500' over uneven outdoor surfaces and negotiate 4 stairs with L rail, alternating sequence MOD I with improved upright posture, increased knee and hip extension, decreased shuffling during gait    Time  8    Period  Weeks    Status  New    Target Date  08/19/17            Plan -  07/14/17 1202    Clinical Impression Statement  Began assessing LTGs today, with pt meeting goal for Berg balance, improving from 41/56>47/56.  Pt has slowed gait velocity today compared to eval-perhaps due to increased focus on heelstrike.  Pt requests work on lower extremity strengthening, as he feels his legs continue to be weak.  Pt will continue to benefit from skilled PT to address strengthening, balance and gait training.    Rehab Potential  Good    PT Frequency  2x / week    PT Duration  8 weeks    PT Treatment/Interventions  ADLs/Self Care Home Management;Gait training;Stair training;Functional mobility training;Therapeutic activities;Therapeutic exercise;Balance training;Neuromuscular re-education;Patient/family education;Passive range of motion;Manual techniques;Taping    PT Next Visit Plan  Check remaining STGs next week; continue to work on weightshift, foot clearance, postural exercises, lower extremity strengthening.    PT Home Exercise Plan  Floor transfers when appropriate.    Consulted and Agree with Plan of Care  Patient       Patient will benefit from skilled therapeutic intervention in order to improve the following deficits and impairments:  Abnormal gait, Decreased balance, Decreased range of motion, Decreased strength, Difficulty walking, Impaired flexibility, Postural dysfunction, Pain  Visit Diagnosis: Unsteadiness on feet  Muscle weakness (generalized)  Other abnormalities of gait and  mobility     Problem List Patient Active Problem List   Diagnosis Date Noted  . Palpitations 09/16/2016  . Anemia 09/09/2016  . Aortic valve calcification 09/09/2016  . Aortic valve sclerosis 09/09/2016  . BPH (benign prostatic hyperplasia) 09/09/2016  . Cardiac murmur 09/09/2016  . Essential (primary) hypertension 09/09/2016  . Facial weakness status post cerebrovascular accident 09/09/2016  . H/O: stroke with residual effects 09/09/2016  . History of stroke 09/09/2016  . Prediabetes 09/09/2016  . Vitamin D deficiency 09/09/2016  . Alteration of consciousness 08/14/2016  . Acute encephalopathy 08/02/2016  . Difficulty speaking 08/02/2016  . Bronchitis 08/02/2016  . Mixed hyperlipidemia 08/02/2016  . Stroke ([place])   . GERD (gastroesophageal reflux disease)   . Gastroesophageal reflux disease without esophagitis   . Transient cerebral ischemia     Ximena Todaro W. 07/14/2017, 12:07 PM  Frazier Butt., PT   [facility] [address] [place], [place], [address] Phone: [phone]   Fax:  [phone]  Name: Atley Neubert MRN: [id number] Date of Birth: [birth year]

## 2017-07-21 ENCOUNTER — Encounter: Payer: Self-pay | Admitting: Physical Therapy

## 2017-07-21 ENCOUNTER — Ambulatory Visit: Payer: Medicare Other | Admitting: Physical Therapy

## 2017-07-21 DIAGNOSIS — M25562 Pain in left knee: Secondary | ICD-10-CM

## 2017-07-21 DIAGNOSIS — R2681 Unsteadiness on feet: Secondary | ICD-10-CM

## 2017-07-21 DIAGNOSIS — R2689 Other abnormalities of gait and mobility: Secondary | ICD-10-CM

## 2017-07-21 DIAGNOSIS — G8929 Other chronic pain: Secondary | ICD-10-CM

## 2017-07-21 DIAGNOSIS — M25561 Pain in right knee: Secondary | ICD-10-CM

## 2017-07-21 DIAGNOSIS — M6281 Muscle weakness (generalized): Secondary | ICD-10-CM | POA: Diagnosis not present

## 2017-07-21 DIAGNOSIS — R296 Repeated falls: Secondary | ICD-10-CM | POA: Diagnosis not present

## 2017-07-21 NOTE — Therapy (Signed)
[facility] [address] [place], [place], [address] Phone: [phone]   Fax:  [phone]  Physical Therapy Treatment  Patient Details  Name: Nicholas Gaines MRN: [id number] Date of Birth: 02-02-1929 Referring Provider: Ward Givens, NP   Encounter Date: 07/21/2017  PT End of Session - 07/21/17 1219    Visit Number  8    Number of Visits  17    Date for PT Re-Evaluation  08/19/17    Authorization Type  [employer]; G code and PN every 10th visit    PT Start Time  1110    PT Stop Time  1155    PT Time Calculation (min)  45 min    Activity Tolerance  Patient tolerated treatment well    Behavior During Therapy  [facility] for tasks assessed/performed       Past Medical History:  Diagnosis Date  . A-fib ([place])   . Arthritis   . GERD (gastroesophageal reflux disease)   . Heart murmur   . HLD (hyperlipidemia)   . Seizures ([place])    last sz 04/17/17  . Stroke [facility])    tia    Past Surgical History:  Procedure Laterality Date  . CARPAL TUNNEL RELEASE     About 10 yr ago (08/13/16)  . LOOP RECORDER INSERTION N/A 09/16/2016   Procedure: Loop Recorder Insertion;  Surgeon: Burston Grayer, MD;  Location: [place] [facility];  Service: Cardiovascular;  Laterality: N/A;  . TONSILLECTOMY      There were no vitals filed for this visit.  Subjective Assessment - 07/21/17 1114    Subjective  No issues or falls to report, has been performing the sit<>stand exercise and feels that his legs are a little stronger.    Pertinent History  seizures, OA, afib, heart murmur, HLD, carpal tunnel release, and CVA with loop recorder insertion in 09/2016, multiple falls    Limitations  Walking    Patient Stated Goals  To improve his walking and improve his shoulder and leg strength    Currently in Pain?  No/denies         [facility] PT Assessment - 07/21/17 1120      Standardized Balance Assessment   Five times sit to stand  comments   10.19 from chair without UE support; increased terminal hip and knee extension as well      Reviewed and added the following exercises to HEP:     PELVIC TILT: Anterior  PLACE A ROLLED TOWEL SIDEWAYS AT LOW BACK, ANOTHER TOWEL ROLL STRAIGHT UP AND DOWN SPINE.   BREATHING IN AS YOU SIT UP TALL, SHOULDERS BACK BREATHE OUT AS YOU RELAX DOWN    Start in slumped position. Roll pelvis forward to arch back. __8_ reps per set, _2__ sets per day.   Copyright  VHI. All rights reserved.  Chair Sitting    Sit at edge of seat, one leg extended, pull toes up towards you, sit "tall" spine straight. Bend forward from the hip, keeping spine straight. toward toes. Hold __20-30_ seconds. Repeat __2-3_ times per session. Do __3-5_ sessions per day.    Advised pt to only perform PWR UP and PWR Twist one UE at a time; will continue to work towards adding [employer] and [employer].   Functional Quadriceps: Sit to Stand    Sit on edge of chair, feet flat on floor. Lean forward and stand upright, extending knees fully. Slowly return to sitting Repeat __10__ times per  set. Do __1-2__ sessions per day.   HIP: Abduction - Standing    Squeeze glutes. Raise leg out and slightly back. _10__ reps per side, __2_ sets per day.  Hold onto a support.  Hip Extension (Standing)    Stand with support. Squeeze pelvic floor and hold. Move right leg backward with straight knee. Hold for __1-2_ seconds. Repeat __10_ times per side. Do __2_ times a day.       PT Education - 07/21/17 1209    Education provided  Yes    Education Details  added seated pelvic tilts and modified PWR and standing hip exercises to HEP    Person(s) Educated  Patient    Methods  Explanation;Demonstration;Handout    Comprehension  Verbalized understanding;Returned demonstration       PT Short Term Goals - 07/21/17 1120      PT SHORT TERM GOAL #1   Title  Will perform HEP (PWR, stretch, strengthen,  balance) with supervision of daughter    Baseline  supervision with previous HEP, new HEP pt requires min-mod cues to perform correctly    Time  4    Period  Weeks    Status  Partially Met      PT SHORT TERM GOAL #2   Title  Will participate in further assessment of LE ROM (hamstrings and hip flexors) and will initiate stretching HEP for increased hip and knee extension ROM    Time  4    Period  Weeks    Status  Achieved      PT SHORT TERM GOAL #3   Title  Will decrease five time sit to stand time to < or = 10 seconds from chair without UE support and will demonstrate increased terminal knee and hip extension during standing    Baseline  12.03 sec 07/14/17, provided with sit <> stand exercise on 12/6 five time sit to stand improved to 10.19 on 12/13    Time  4    Period  Weeks    Status  Achieved      PT SHORT TERM GOAL #4   Title  Will increase BERG balance score by 5 points from eval (41/56)    Baseline  47/56 07/14/17    Time  4    Period  Weeks    Status  Achieved      PT SHORT TERM GOAL #5   Title  Will increase gait speed to > or = 2.79f/sec    Baseline  2.21 ft/sec 07/14/17    Time  4    Period  Weeks    Status  Not Met        PT Long Term Goals - 06/20/17 1129      PT LONG TERM GOAL #1   Title  Pt will demonstrate independence with HEP and will identify community wellness to participate in at D/C    Time  8    Period  Weeks    Status  New    Target Date  08/19/17      PT LONG TERM GOAL #2   Title  Will demonstrate 10 deg increase in hip and knee extension ROM    Baseline  TBD    Time  8    Period  Weeks    Status  New    Target Date  08/19/17      PT LONG TERM GOAL #3   Title  Will increase BERG balance score 10 points from initial assessment (41/56)  Baseline  41/56    Time  8    Period  Weeks    Status  New    Target Date  08/19/17      PT LONG TERM GOAL #4   Title  Will increase gait speed to > or = 3.0 ft/sec with improved foot clearance, trunk  rotation, arm swing    Baseline  2.4 ft/sec    Time  8    Period  Weeks    Status  New    Target Date  08/19/17      PT LONG TERM GOAL #5   Title  Pt will perform floor > stand transfer with UE support and supervision    Baseline  unable    Time  8    Period  Weeks    Status  New    Target Date  08/19/17      Additional Long Term Goals   Additional Long Term Goals  Yes      PT LONG TERM GOAL #6   Title  Will ambulate x 500' over uneven outdoor surfaces and negotiate 4 stairs with L rail, alternating sequence MOD I with improved upright posture, increased knee and hip extension, decreased shuffling during gait    Time  8    Period  Weeks    Status  New    Target Date  08/19/17            Plan - 07/21/17 1213    Clinical Impression Statement  Treatment session today focused on continued assessment of STG.  Due to pt performing new sit <> stand exercise consistently for one week re-assessed five time sit to stand with pt meeting STG today.  Pt has met 3/5 STG and is making steady progress towards LTG; pt has not had a fall since beginning therapy. Reviewed current HEP, removed two stretches due to pt inability to perform with safe technique and added seated pelvic tilts, modified PWR seated exercises and standing hip strengthening exercises.  Currently requires mod verbal and visual cues to perform correctly/safely.  Pt demonstrated improved upright posture, foot clearance and step length at end of session.  Will continue to address and progress towards LTG.    Rehab Potential  Good    PT Frequency  2x / week    PT Duration  8 weeks    PT Treatment/Interventions  ADLs/Self Care Home Management;Gait training;Stair training;Functional mobility training;Therapeutic activities;Therapeutic exercise;Balance training;Neuromuscular re-education;Patient/family education;Passive range of motion;Manual techniques;Taping    PT Next Visit Plan  Continue to work towards performing seated PWR,  postural control/pelvic tilts, continue to work on weightshift, foot clearance, postural exercises, lower extremity strengthening.    PT Home Exercise Plan  Floor transfers when appropriate.    Consulted and Agree with Plan of Care  Patient       Patient will benefit from skilled therapeutic intervention in order to improve the following deficits and impairments:  Abnormal gait, Decreased balance, Decreased range of motion, Decreased strength, Difficulty walking, Impaired flexibility, Postural dysfunction, Pain  Visit Diagnosis: Unsteadiness on feet  Muscle weakness (generalized)  Other abnormalities of gait and mobility  Repeated falls  Chronic pain of left knee  Chronic pain of right knee     Problem List Patient Active Problem List   Diagnosis Date Noted  . Palpitations 09/16/2016  . Anemia 09/09/2016  . Aortic valve calcification 09/09/2016  . Aortic valve sclerosis 09/09/2016  . BPH (benign prostatic hyperplasia) 09/09/2016  . Cardiac murmur  09/09/2016  . Essential (primary) hypertension 09/09/2016  . Facial weakness status post cerebrovascular accident 09/09/2016  . H/O: stroke with residual effects 09/09/2016  . History of stroke 09/09/2016  . Prediabetes 09/09/2016  . Vitamin D deficiency 09/09/2016  . Alteration of consciousness 08/14/2016  . Acute encephalopathy 08/02/2016  . Difficulty speaking 08/02/2016  . Bronchitis 08/02/2016  . Mixed hyperlipidemia 08/02/2016  . Stroke ([place])   . GERD (gastroesophageal reflux disease)   . Gastroesophageal reflux disease without esophagitis   . Transient cerebral ischemia     Rico Junker, PT, DPT 07/21/17    12:25 PM    [facility] [address] [place], [place], [address] Phone: [phone]   Fax:  [phone]  Name: Nicholas Gaines MRN: [id number] Date of Birth: 1929/05/18

## 2017-07-21 NOTE — Patient Instructions (Addendum)
PELVIC TILT: Anterior  PLACE A ROLLED TOWEL SIDEWAYS AT LOW BACK, ANOTHER TOWEL ROLL STRAIGHT UP AND DOWN SPINE.   BREATHING IN AS YOU SIT UP TALL, SHOULDERS BACK BREATHE OUT AS YOU RELAX DOWN    Start in slumped position. Roll pelvis forward to arch back. __8_ reps per set, _2__ sets per day.   Copyright  VHI. All rights reserved.  Chair Sitting    Sit at edge of seat, one leg extended, pull toes up towards you, sit "tall" spine straight. Bend forward from the hip, keeping spine straight.  toward toes.  Hold __20-30_ seconds. Repeat __2-3_ times per session. Do __3-5_ sessions per day.     Functional Quadriceps: Sit to Stand    Sit on edge of chair, feet flat on floor. Lean forward and stand upright, extending knees fully.  Slowly return to sitting Repeat __10__ times per set. Do __1-2__  sessions per day.   HIP: Abduction - Standing    Squeeze glutes. Raise leg out and slightly back. _10__ reps per side, __2_ sets per day.  Hold onto a support.  Hip Extension (Standing)    Stand with support. Squeeze pelvic floor and hold. Move right leg backward with straight knee. Hold for __1-2_ seconds. Repeat __10_ times per side. Do __2_ times a day. .Marland Kitchen

## 2017-07-23 LAB — CUP PACEART REMOTE DEVICE CHECK
Date Time Interrogation Session: 20181205193600
MDC IDC PG IMPLANT DT: 20180208

## 2017-07-25 ENCOUNTER — Ambulatory Visit: Payer: Medicare Other | Admitting: Physical Therapy

## 2017-07-25 ENCOUNTER — Encounter: Payer: Self-pay | Admitting: Physical Therapy

## 2017-07-25 DIAGNOSIS — R2689 Other abnormalities of gait and mobility: Secondary | ICD-10-CM | POA: Diagnosis not present

## 2017-07-25 DIAGNOSIS — R2681 Unsteadiness on feet: Secondary | ICD-10-CM

## 2017-07-25 DIAGNOSIS — M6281 Muscle weakness (generalized): Secondary | ICD-10-CM

## 2017-07-25 DIAGNOSIS — M25562 Pain in left knee: Secondary | ICD-10-CM | POA: Diagnosis not present

## 2017-07-25 DIAGNOSIS — M25561 Pain in right knee: Secondary | ICD-10-CM | POA: Diagnosis not present

## 2017-07-25 DIAGNOSIS — G8929 Other chronic pain: Secondary | ICD-10-CM | POA: Diagnosis not present

## 2017-07-25 DIAGNOSIS — R296 Repeated falls: Secondary | ICD-10-CM | POA: Diagnosis not present

## 2017-07-25 NOTE — Therapy (Signed)
[facility] [address] [place], [place], [address] Phone: [phone]   Fax:  [phone]  Physical Therapy Treatment  Patient Details  Name: Nicholas Gaines MRN: [id number] Date of Birth: [birth year] Referring Provider: Ward Givens, NP   Encounter Date: 07/25/2017  PT End of Session - 07/25/17 1236    Visit Number  9    Number of Visits  17    Date for PT Re-Evaluation  08/19/17    Authorization Type  [employer]; G code and PN every 10th visit    PT Start Time  1155 arrived late    PT Stop Time  1231    PT Time Calculation (min)  36 min    Activity Tolerance  Patient tolerated treatment well    Behavior During Therapy  [facility] for tasks assessed/performed       Past Medical History:  Diagnosis Date  . A-fib ([place])   . Arthritis   . GERD (gastroesophageal reflux disease)   . Heart murmur   . HLD (hyperlipidemia)   . Seizures ([place])    last sz 04/17/17  . Stroke [facility])    tia    Past Surgical History:  Procedure Laterality Date  . CARPAL TUNNEL RELEASE     About 10 yr ago (08/13/16)  . LOOP RECORDER INSERTION N/A 09/16/2016   Procedure: Loop Recorder Insertion;  Surgeon: White Grayer, MD;  Location: [place] [facility];  Service: Cardiovascular;  Laterality: N/A;  . TONSILLECTOMY      There were no vitals filed for this visit.  Subjective Assessment - 07/25/17 1157    Subjective  No increase in back pain after last session.  Still feels like the sit <> stand exercise has been the most helpful.    Pertinent History  seizures, OA, afib, heart murmur, HLD, carpal tunnel release, and CVA with loop recorder insertion in 09/2016, multiple falls    Limitations  Walking    Patient Stated Goals  To improve his walking and improve his shoulder and leg strength    Currently in Pain?  No/denies                      [facility] Adult PT Treatment/Exercise - 07/25/17 1221      Knee/Hip  Exercises: Standing   Terminal Knee Extension  Strengthening;Right;Left;10 reps;Theraband;1 set    Theraband Level (Terminal Knee Extension)  Level 2 (Red)    Hip Abduction  Stengthening;Right;Left;1 set;10 reps;Knee straight    Hip Extension  Stengthening;Right;Left;1 set;10 reps;Knee straight    Other Standing Knee Exercises  High knee marching with one UE support on counter x 4 laps; tandem gait forwards with one UE support on counter x 4 laps             PT Education - 07/25/17 1236    Education provided  Yes    Education Details  added to HEP    Person(s) Educated  Patient    Methods  Explanation;Demonstration;Handout    Comprehension  Verbalized understanding;Returned demonstration       PT Short Term Goals - 07/21/17 1120      PT SHORT TERM GOAL #1   Title  Will perform HEP (PWR, stretch, strengthen, balance) with supervision of daughter    Baseline  supervision with previous HEP, new HEP pt requires min-mod cues to perform correctly    Time  4    Period  Weeks    Status  Partially Met      PT SHORT TERM GOAL #2   Title  Will participate in further assessment of LE ROM (hamstrings and hip flexors) and will initiate stretching HEP for increased hip and knee extension ROM    Time  4    Period  Weeks    Status  Achieved      PT SHORT TERM GOAL #3   Title  Will decrease five time sit to stand time to < or = 10 seconds from chair without UE support and will demonstrate increased terminal knee and hip extension during standing    Baseline  12.03 sec 07/14/17, provided with sit <> stand exercise on 12/6 five time sit to stand improved to 10.19 on 12/13    Time  4    Period  Weeks    Status  Achieved      PT SHORT TERM GOAL #4   Title  Will increase BERG balance score by 5 points from eval (41/56)    Baseline  47/56 07/14/17    Time  4    Period  Weeks    Status  Achieved      PT SHORT TERM GOAL #5   Title  Will increase gait speed to > or = 2.36f/sec    Baseline   2.21 ft/sec 07/14/17    Time  4    Period  Weeks    Status  Not Met        PT Long Term Goals - 06/20/17 1129      PT LONG TERM GOAL #1   Title  Pt will demonstrate independence with HEP and will identify community wellness to participate in at D/C    Time  8    Period  Weeks    Status  New    Target Date  08/19/17      PT LONG TERM GOAL #2   Title  Will demonstrate 10 deg increase in hip and knee extension ROM    Baseline  TBD    Time  8    Period  Weeks    Status  New    Target Date  08/19/17      PT LONG TERM GOAL #3   Title  Will increase BERG balance score 10 points from initial assessment (41/56)    Baseline  41/56    Time  8    Period  Weeks    Status  New    Target Date  08/19/17      PT LONG TERM GOAL #4   Title  Will increase gait speed to > or = 3.0 ft/sec with improved foot clearance, trunk rotation, arm swing    Baseline  2.4 ft/sec    Time  8    Period  Weeks    Status  New    Target Date  08/19/17      PT LONG TERM GOAL #5   Title  Pt will perform floor > stand transfer with UE support and supervision    Baseline  unable    Time  8    Period  Weeks    Status  New    Target Date  08/19/17      Additional Long Term Goals   Additional Long Term Goals  Yes      PT LONG TERM GOAL #6   Title  Will ambulate x 500' over uneven outdoor surfaces and negotiate 4 stairs with L rail, alternating sequence MOD I with  improved upright posture, increased knee and hip extension, decreased shuffling during gait    Time  8    Period  Weeks    Status  New    Target Date  08/19/17            Plan - 06-Aug-2017 1237    Clinical Impression Statement  Treatment session today continued to review and focus on standing balance and strengthening exercises with review of gluteal exercises and addition of more dynamic balance exercises including hip flexion marching and tandem. Initiated terminal knee and hip extension strength training but required constant cues to  perform correctly; will continue to review with pt prior to adding to HEP.    Rehab Potential  Good    PT Frequency  2x / week    PT Duration  8 weeks    PT Treatment/Interventions  ADLs/Self Care Home Management;Gait training;Stair training;Functional mobility training;Therapeutic activities;Therapeutic exercise;Balance training;Neuromuscular re-education;Patient/family education;Passive range of motion;Manual techniques;Taping    PT Next Visit Plan  10th visit PN/G code - recheck gait velocity!  bilat and single leg stance during red theraband terminal hip extension; continue to work on weightshift, foot clearance, postural exercises, lower extremity strengthening.    PT Home Exercise Plan  Floor transfers when appropriate.    Consulted and Agree with Plan of Care  Patient       Patient will benefit from skilled therapeutic intervention in order to improve the following deficits and impairments:  Abnormal gait, Decreased balance, Decreased range of motion, Decreased strength, Difficulty walking, Impaired flexibility, Postural dysfunction, Pain  Visit Diagnosis: Unsteadiness on feet  Muscle weakness (generalized)  Other abnormalities of gait and mobility  Chronic pain of left knee  Chronic pain of right knee  Repeated falls   G-Codes - August 06, 2017 1241    Functional Assessment Tool Used (Outpatient Only)  BERG 47/56, gait velocity decreased to 2.2 ft/sec    Functional Limitation  Mobility: Walking and moving around       Problem List Patient Active Problem List   Diagnosis Date Noted  . Palpitations 09/16/2016  . Anemia 09/09/2016  . Aortic valve calcification 09/09/2016  . Aortic valve sclerosis 09/09/2016  . BPH (benign prostatic hyperplasia) 09/09/2016  . Cardiac murmur 09/09/2016  . Essential (primary) hypertension 09/09/2016  . Facial weakness status post cerebrovascular accident 09/09/2016  . H/O: stroke with residual effects 09/09/2016  . History of stroke 09/09/2016   . Prediabetes 09/09/2016  . Vitamin D deficiency 09/09/2016  . Alteration of consciousness 08/14/2016  . Acute encephalopathy 08/02/2016  . Difficulty speaking 08/02/2016  . Bronchitis 08/02/2016  . Mixed hyperlipidemia 08/02/2016  . Stroke ([place])   . GERD (gastroesophageal reflux disease)   . Gastroesophageal reflux disease without esophagitis   . Transient cerebral ischemia     Rico Junker, PT, DPT 2017-08-06    12:43 PM    [facility] [address] [place], [place], [address] Phone: [phone]   Fax:  [phone]  Name: Nicholas Gaines MRN: [id number] Date of Birth: 29-Apr-1929

## 2017-07-25 NOTE — Patient Instructions (Signed)
PELVIC TILT: Anterior  PLACE A ROLLED TOWEL SIDEWAYS AT LOW BACK, ANOTHER TOWEL ROLL STRAIGHT UP AND DOWN SPINE.   BREATHING IN AS YOU SIT UP TALL, SHOULDERS BACK BREATHE OUT AS YOU RELAX DOWN    Start in slumped position. Roll pelvis forward to arch back. __8_ reps per set, _2__ sets per day.   Copyright  [employer]. All rights reserved.  Chair Sitting    Sit at edge of seat, one leg extended, pull toes up towards you, sit "tall" spine straight. Bend forward from the hip, keeping spine straight. toward toes. Hold __20-30_ seconds. Repeat __2-3_ times per session. Do __3-5_ sessions per day.     Functional Quadriceps: Sit to Stand    Sit on edge of chair, feet flat on floor. Lean forward and stand upright, extending knees fully. Slowly return to sitting Repeat __10__ times per set. Do __1-2__ sessions per day.   HIP: Abduction - Standing    Squeeze glutes. Raise leg out and slightly back. _10__ reps per side, __2_ sets per day.  Hold onto a support.  Hip Extension (Standing)    Stand with support. Squeeze pelvic floor and hold. Move right leg backward with straight knee. Hold for __1-2_ seconds. Repeat __10_ times per side. Do __2_ times a day.  FUNCTIONAL MOBILITY: Marching - Standing    Hold onto a support of counter top, perform high knee marching forwards walking along counter.  Repeat 4-6 times lifting each knee high in the air.  Feet Heel-Toe "Tandem"    One hand holding on to counter top: walk a straight line bringing one foot directly in front of the other. Perform 4-6 laps along counter top.

## 2017-07-28 ENCOUNTER — Ambulatory Visit: Payer: Medicare Other | Admitting: Physical Therapy

## 2017-07-28 ENCOUNTER — Encounter: Payer: Self-pay | Admitting: Physical Therapy

## 2017-07-28 DIAGNOSIS — M25561 Pain in right knee: Secondary | ICD-10-CM | POA: Diagnosis not present

## 2017-07-28 DIAGNOSIS — M25562 Pain in left knee: Secondary | ICD-10-CM

## 2017-07-28 DIAGNOSIS — R2689 Other abnormalities of gait and mobility: Secondary | ICD-10-CM

## 2017-07-28 DIAGNOSIS — R296 Repeated falls: Secondary | ICD-10-CM | POA: Diagnosis not present

## 2017-07-28 DIAGNOSIS — R2681 Unsteadiness on feet: Secondary | ICD-10-CM | POA: Diagnosis not present

## 2017-07-28 DIAGNOSIS — M6281 Muscle weakness (generalized): Secondary | ICD-10-CM

## 2017-07-28 DIAGNOSIS — G8929 Other chronic pain: Secondary | ICD-10-CM | POA: Diagnosis not present

## 2017-07-28 NOTE — Therapy (Signed)
[facility] [address] [place], [place], [address] Phone: [phone]   Fax:  [phone]  Physical Therapy Treatment and Progress note  Patient Details  Name: Nicholas Gaines MRN: [id number] Date of Birth: 1928/12/04 Referring Provider: Ward Givens, NP   Encounter Date: 07/28/2017  PT End of Session - 07/28/17 1149    Visit Number  10    Number of Visits  17    Date for PT Re-Evaluation  08/19/17    Authorization Type  [employer]; G code and PN every 10th visit    PT Start Time  1110 arrived late    PT Stop Time  1146    PT Time Calculation (min)  36 min    Activity Tolerance  Patient tolerated treatment well    Behavior During Therapy  [facility] for tasks assessed/performed       Past Medical History:  Diagnosis Date  . A-fib ([place])   . Arthritis   . GERD (gastroesophageal reflux disease)   . Heart murmur   . HLD (hyperlipidemia)   . Seizures ([place])    last sz 04/17/17  . Stroke [facility])    tia    Past Surgical History:  Procedure Laterality Date  . CARPAL TUNNEL RELEASE     About 10 yr ago (08/13/16)  . LOOP RECORDER INSERTION N/A 09/16/2016   Procedure: Loop Recorder Insertion;  Surgeon: Shampine Grayer, MD;  Location: [place] [facility];  Service: Cardiovascular;  Laterality: N/A;  . TONSILLECTOMY      There were no vitals filed for this visit.  Subjective Assessment - 07/28/17 1113    Subjective  Reports back feels better and that exercises are going well at home.      Pertinent History  seizures, OA, afib, heart murmur, HLD, carpal tunnel release, and CVA with loop recorder insertion in 09/2016, multiple falls    Limitations  Walking    Patient Stated Goals  To improve his walking and improve his shoulder and leg strength    Currently in Pain?  Yes         [facility] PT Assessment - 07/28/17 1117      Standardized Balance Assessment   10 Meter Walk  12.87 or 2.54 ft/sec without AD              [facility] Adult PT Treatment/Exercise - 07/28/17 1120      Knee/Hip Exercises: Standing   Terminal Knee Extension  Strengthening;Right;Left;10 reps;Theraband;1 set last rep pt performed sustained contraction with SLS    Theraband Level (Terminal Knee Extension)  Level 2 (Red)      Knee/Hip Exercises: Seated   Other Seated Knee/Hip Exercises  Seated on wedge with sheet at superior pelvis while performing anterior pelvic tilts facilitated by therapist pulling on sheet and use of inhalation x 5 reps with 20 second hold          Balance Exercises - 07/28/17 1158      Balance Exercises: Standing   Marching Limitations  high knee marching in // bars but with no UE support x 4 laps with verbal cues for increased stance LE extension activation          PT Short Term Goals - 07/21/17 1120      PT SHORT TERM GOAL #1   Title  Will perform HEP (PWR, stretch, strengthen, balance) with supervision of daughter    Baseline  supervision with previous HEP, new HEP pt requires min-mod cues  to perform correctly    Time  4    Period  Weeks    Status  Partially Met      PT SHORT TERM GOAL #2   Title  Will participate in further assessment of LE ROM (hamstrings and hip flexors) and will initiate stretching HEP for increased hip and knee extension ROM    Time  4    Period  Weeks    Status  Achieved      PT SHORT TERM GOAL #3   Title  Will decrease five time sit to stand time to < or = 10 seconds from chair without UE support and will demonstrate increased terminal knee and hip extension during standing    Baseline  12.03 sec 07/14/17, provided with sit <> stand exercise on 12/6 five time sit to stand improved to 10.19 on 12/13    Time  4    Period  Weeks    Status  Achieved      PT SHORT TERM GOAL #4   Title  Will increase BERG balance score by 5 points from eval (41/56)    Baseline  47/56 07/14/17    Time  4    Period  Weeks    Status  Achieved      PT SHORT TERM GOAL #5    Title  Will increase gait speed to > or = 2.45f/sec    Baseline  2.21 ft/sec 07/14/17    Time  4    Period  Weeks    Status  Not Met        PT Long Term Goals - 07/28/17 1156      PT LONG TERM GOAL #1   Title  Pt will demonstrate independence with HEP and will identify community wellness to participate in at D/C    Time  8    Period  Weeks    Status  New    Target Date  08/19/17      PT LONG TERM GOAL #2   Title  Will demonstrate 10 deg increase in hip and knee extension ROM    Time  8    Period  Weeks    Status  New    Target Date  08/19/17      PT LONG TERM GOAL #3   Title  Will increase BERG balance score 10 points from initial assessment (41/56)    Baseline  47/56 at 4 weeks    Time  8    Period  Weeks    Status  New    Target Date  08/19/17      PT LONG TERM GOAL #4   Title  Will increase gait speed to > or = 3.0 ft/sec with improved foot clearance, trunk rotation, arm swing    Baseline  2.4 ft/sec, 2.5 ft/sec on 10th visit    Time  8    Period  Weeks    Status  New    Target Date  08/19/17      PT LONG TERM GOAL #5   Title  Pt will perform floor > stand transfer with UE support and supervision    Baseline  unable    Time  8    Period  Weeks    Status  New    Target Date  08/19/17      PT LONG TERM GOAL #6   Title  Will ambulate x 500' over uneven outdoor surfaces and negotiate 4 stairs with L rail,  alternating sequence MOD I with improved upright posture, increased knee and hip extension, decreased shuffling during gait    Time  8    Period  Weeks    Status  New    Target Date  08/19/17            Plan - Aug 27, 2017 1149    Clinical Impression Statement  For 10th visit performed re-assessment of gait velocity with increase to >2.5 ft/sec.  Continued pelvic and LE ROM, posture and strength training in sitting and standing with addition of SLS while maintaining hip and knee terminal extension against resistance of red theraband.  Also continued dynamic  SLS training with high knee marching forwards x 4 reps without UE support but cues for hip and knee activation in stance.  Pt tolerated well with decreased shuffling noted at end of session.    Rehab Potential  Good    PT Frequency  2x / week    PT Duration  8 weeks    PT Treatment/Interventions  ADLs/Self Care Home Management;Gait training;Stair training;Functional mobility training;Therapeutic activities;Therapeutic exercise;Balance training;Neuromuscular re-education;Patient/family education;Passive range of motion;Manual techniques;Taping    PT Next Visit Plan  gait with increased arm swing; bilat and single leg stance during red theraband terminal hip extension; continue to work on weightshift, foot clearance, postural exercises, lower extremity strengthening.    PT Home Exercise Plan  Floor transfers when appropriate.    Consulted and Agree with Plan of Care  Patient       Patient will benefit from skilled therapeutic intervention in order to improve the following deficits and impairments:  Abnormal gait, Decreased balance, Decreased range of motion, Decreased strength, Difficulty walking, Impaired flexibility, Postural dysfunction, Pain  Visit Diagnosis: Unsteadiness on feet  Muscle weakness (generalized)  Other abnormalities of gait and mobility  Chronic pain of left knee  Chronic pain of right knee  Repeated falls   G-Codes - August 27, 2017 1115    Functional Assessment Tool Used (Outpatient Only)  BERG 47/56, gait velocity 2.54 ft/sec    Functional Limitation  Mobility: Walking and moving around    Mobility: Walking and Moving Around Current Status [phone])  At least 20 percent but less than 40 percent impaired, limited or restricted    Mobility: Walking and Moving Around Goal Status [phone])  At least 1 percent but less than 20 percent impaired, limited or restricted      Physical Therapy Progress Note  Dates of Reporting Period: 06/20/17 to 08-27-2017  Objective Reports of  Subjective Statement: See impression statement above  Objective Measurements: BERG and gait velocity  Goal Update: See STG achievement; pt making progress towards LTG  Plan: Continue POC  Reason Skilled Services are Required: To continue to address LE strength, ROM impairments, balance and gait impairments to decrease falls risk and maximize functional mobility independence.   Problem List Patient Active Problem List   Diagnosis Date Noted  . Palpitations 09/16/2016  . Anemia 09/09/2016  . Aortic valve calcification 09/09/2016  . Aortic valve sclerosis 09/09/2016  . BPH (benign prostatic hyperplasia) 09/09/2016  . Cardiac murmur 09/09/2016  . Essential (primary) hypertension 09/09/2016  . Facial weakness status post cerebrovascular accident 09/09/2016  . H/O: stroke with residual effects 09/09/2016  . History of stroke 09/09/2016  . Prediabetes 09/09/2016  . Vitamin D deficiency 09/09/2016  . Alteration of consciousness 08/14/2016  . Acute encephalopathy 08/02/2016  . Difficulty speaking 08/02/2016  . Bronchitis 08/02/2016  . Mixed hyperlipidemia 08/02/2016  . Stroke ([place])   .  GERD (gastroesophageal reflux disease)   . Gastroesophageal reflux disease without esophagitis   . Transient cerebral ischemia     Rico Junker, PT, DPT 07/28/17    12:01 PM    [facility] [address] [place], [place], [address] Phone: [phone]   Fax:  [phone]  Name: Kharon Hixon MRN: [id number] Date of Birth: 12-28-1928

## 2017-08-01 ENCOUNTER — Encounter: Payer: Self-pay | Admitting: Physical Therapy

## 2017-08-01 ENCOUNTER — Ambulatory Visit: Payer: Medicare Other | Admitting: Physical Therapy

## 2017-08-01 DIAGNOSIS — G8929 Other chronic pain: Secondary | ICD-10-CM | POA: Diagnosis not present

## 2017-08-01 DIAGNOSIS — R2681 Unsteadiness on feet: Secondary | ICD-10-CM | POA: Diagnosis not present

## 2017-08-01 DIAGNOSIS — M25561 Pain in right knee: Secondary | ICD-10-CM | POA: Diagnosis not present

## 2017-08-01 DIAGNOSIS — R2689 Other abnormalities of gait and mobility: Secondary | ICD-10-CM

## 2017-08-01 DIAGNOSIS — R296 Repeated falls: Secondary | ICD-10-CM

## 2017-08-01 DIAGNOSIS — M6281 Muscle weakness (generalized): Secondary | ICD-10-CM | POA: Diagnosis not present

## 2017-08-01 DIAGNOSIS — M25562 Pain in left knee: Secondary | ICD-10-CM

## 2017-08-01 NOTE — Therapy (Signed)
[facility] [address] [place], [place], [address] Phone: [phone]   Fax:  [phone]  Physical Therapy Treatment  Patient Details  Name: Nicholas Gaines MRN: [id number] Date of Birth: November 23, 1928 Referring Provider: Ward Givens, NP   Encounter Date: 08/01/2017  PT End of Session - 08/01/17 1104    Visit Number  11    Number of Visits  17    Date for PT Re-Evaluation  08/19/17    Authorization Type  [employer]; G code and PN every 10th visit    PT Start Time  1020    PT Stop Time  1101    PT Time Calculation (min)  41 min    Activity Tolerance  Patient tolerated treatment well    Behavior During Therapy  [facility] for tasks assessed/performed       Past Medical History:  Diagnosis Date  . A-fib ([place])   . Arthritis   . GERD (gastroesophageal reflux disease)   . Heart murmur   . HLD (hyperlipidemia)   . Seizures ([place])    last sz 04/17/17  . Stroke [facility])    tia    Past Surgical History:  Procedure Laterality Date  . CARPAL TUNNEL RELEASE     About 10 yr ago (08/13/16)  . LOOP RECORDER INSERTION N/A 09/16/2016   Procedure: Loop Recorder Insertion;  Surgeon: Pewitt Grayer, MD;  Location: [place] [facility];  Service: Cardiovascular;  Laterality: N/A;  . TONSILLECTOMY      There were no vitals filed for this visit.  Subjective Assessment - 08/01/17 1022    Subjective  Pt doing well, no issues or falls to report.  Exercises going well but feels a little soreness in his shoulders afterwards.    Pertinent History  seizures, OA, afib, heart murmur, HLD, carpal tunnel release, and CVA with loop recorder insertion in 09/2016, multiple falls    Limitations  Walking    Patient Stated Goals  To improve his walking and improve his shoulder and leg strength    Currently in Pain?  No/denies                      [facility] Adult PT Treatment/Exercise - 08/01/17 1027      Ambulation/Gait   Ambulation/Gait  Yes    Ambulation/Gait Assistance  5: Supervision    Ambulation/Gait Assistance Details  performed gait around track x 2 with focus on increasing reciprocal arm swing with therapist facilitating with walking sticks; verbal cues to allow upper humerus to swing forwards/back (not just elbow flexion<>ext) and verbal cues for full step length and heel strike bilaterally.  Demonstrated more normal gait sequence and increased foot clearance with facilitation of arm swing    Ambulation Distance (Feet)  230 Feet    Assistive device  Other (Comment) walking sticks for UE swing    Gait Pattern  Step-through pattern    Ambulation Surface  Level;Indoor    Stairs  --      Knee/Hip Exercises: Stretches   Hip Flexor Stretch  Right;Left;1 rep;60 seconds      Knee/Hip Exercises: Supine   Terminal Knee Extension  --    Other Supine Knee/Hip Exercises  Single leg terminal hip extension bridges with LE off side of mat, foot on step x 10 reps each side with verbal cues to press through heel, instead of toes          Balance Exercises - 08/01/17 1048  Balance Exercises: Standing   Step Ups  6 inch;UE support 2 backwards, 5 sets x 4 reps on stairs      OTAGO PROGRAM   Heel Walking  Support          PT Short Term Goals - 07/21/17 1120      PT SHORT TERM GOAL #1   Title  Will perform HEP (PWR, stretch, strengthen, balance) with supervision of daughter    Baseline  supervision with previous HEP, new HEP pt requires min-mod cues to perform correctly    Time  4    Period  Weeks    Status  Partially Met      PT SHORT TERM GOAL #2   Title  Will participate in further assessment of LE ROM (hamstrings and hip flexors) and will initiate stretching HEP for increased hip and knee extension ROM    Time  4    Period  Weeks    Status  Achieved      PT SHORT TERM GOAL #3   Title  Will decrease five time sit to stand time to < or = 10 seconds from chair without UE support and will  demonstrate increased terminal knee and hip extension during standing    Baseline  12.03 sec 07/14/17, provided with sit <> stand exercise on 12/6 five time sit to stand improved to 10.19 on 12/13    Time  4    Period  Weeks    Status  Achieved      PT SHORT TERM GOAL #4   Title  Will increase BERG balance score by 5 points from eval (41/56)    Baseline  47/56 07/14/17    Time  4    Period  Weeks    Status  Achieved      PT SHORT TERM GOAL #5   Title  Will increase gait speed to > or = 2.4f/sec    Baseline  2.21 ft/sec 07/14/17    Time  4    Period  Weeks    Status  Not Met        PT Long Term Goals - 07/28/17 1156      PT LONG TERM GOAL #1   Title  Pt will demonstrate independence with HEP and will identify community wellness to participate in at D/C    Time  8    Period  Weeks    Status  New    Target Date  08/19/17      PT LONG TERM GOAL #2   Title  Will demonstrate 10 deg increase in hip and knee extension ROM    Time  8    Period  Weeks    Status  New    Target Date  08/19/17      PT LONG TERM GOAL #3   Title  Will increase BERG balance score 10 points from initial assessment (41/56)    Baseline  47/56 at 4 weeks    Time  8    Period  Weeks    Status  New    Target Date  08/19/17      PT LONG TERM GOAL #4   Title  Will increase gait speed to > or = 3.0 ft/sec with improved foot clearance, trunk rotation, arm swing    Baseline  2.4 ft/sec, 2.5 ft/sec on 10th visit    Time  8    Period  Weeks    Status  New    Target  Date  08/19/17      PT LONG TERM GOAL #5   Title  Pt will perform floor > stand transfer with UE support and supervision    Baseline  unable    Time  8    Period  Weeks    Status  New    Target Date  08/19/17      PT LONG TERM GOAL #6   Title  Will ambulate x 500' over uneven outdoor surfaces and negotiate 4 stairs with L rail, alternating sequence MOD I with improved upright posture, increased knee and hip extension, decreased shuffling  during gait    Time  8    Period  Weeks    Status  New    Target Date  08/19/17            Plan - 08/01/17 1105    Clinical Impression Statement  Treatment session today focused on lengthening of hip flexor mm, strengthening of hip extensor mm, balance training and further gait training for increased UE swing, step length, heel strike and foot clearance.  Pt demonstrated more normal gait sequence and decreased shuffling with facilitation of UE swing; will continue to progress towards LTG.    Rehab Potential  Good    PT Frequency  2x / week    PT Duration  8 weeks    PT Treatment/Interventions  ADLs/Self Care Home Management;Gait training;Stair training;Functional mobility training;Therapeutic activities;Therapeutic exercise;Balance training;Neuromuscular re-education;Patient/family education;Passive range of motion;Manual techniques;Taping    PT Next Visit Plan  gait with increased arm swing - walking poles; hip flexor stretch and terminal hip and knee extension training; bilat and single leg stance during red theraband terminal hip extension; continue to work on weightshift, foot clearance, postural exercises, lower extremity strengthening.    PT Home Exercise Plan  Floor transfers when appropriate.    Consulted and Agree with Plan of Care  Patient       Patient will benefit from skilled therapeutic intervention in order to improve the following deficits and impairments:  Abnormal gait, Decreased balance, Decreased range of motion, Decreased strength, Difficulty walking, Impaired flexibility, Postural dysfunction, Pain  Visit Diagnosis: Unsteadiness on feet  Muscle weakness (generalized)  Other abnormalities of gait and mobility  Chronic pain of left knee  Chronic pain of right knee  Repeated falls     Problem List Patient Active Problem List   Diagnosis Date Noted  . Palpitations 09/16/2016  . Anemia 09/09/2016  . Aortic valve calcification 09/09/2016  . Aortic  valve sclerosis 09/09/2016  . BPH (benign prostatic hyperplasia) 09/09/2016  . Cardiac murmur 09/09/2016  . Essential (primary) hypertension 09/09/2016  . Facial weakness status post cerebrovascular accident 09/09/2016  . H/O: stroke with residual effects 09/09/2016  . History of stroke 09/09/2016  . Prediabetes 09/09/2016  . Vitamin D deficiency 09/09/2016  . Alteration of consciousness 08/14/2016  . Acute encephalopathy 08/02/2016  . Difficulty speaking 08/02/2016  . Bronchitis 08/02/2016  . Mixed hyperlipidemia 08/02/2016  . Stroke ([place])   . GERD (gastroesophageal reflux disease)   . Gastroesophageal reflux disease without esophagitis   . Transient cerebral ischemia     Rico Junker, PT, DPT 08/01/17    11:09 AM    [facility] [address] [place], [place], [address] Phone: [phone]   Fax:  [phone]  Name: Nicholas Gaines MRN: [id number] Date of Birth: June 13, 1929

## 2017-08-04 ENCOUNTER — Encounter: Payer: Self-pay | Admitting: Physical Therapy

## 2017-08-04 ENCOUNTER — Ambulatory Visit: Payer: Medicare Other | Admitting: Physical Therapy

## 2017-08-04 DIAGNOSIS — M6281 Muscle weakness (generalized): Secondary | ICD-10-CM | POA: Diagnosis not present

## 2017-08-04 DIAGNOSIS — R2681 Unsteadiness on feet: Secondary | ICD-10-CM

## 2017-08-04 DIAGNOSIS — R296 Repeated falls: Secondary | ICD-10-CM | POA: Diagnosis not present

## 2017-08-04 DIAGNOSIS — M25562 Pain in left knee: Secondary | ICD-10-CM | POA: Diagnosis not present

## 2017-08-04 DIAGNOSIS — R2689 Other abnormalities of gait and mobility: Secondary | ICD-10-CM

## 2017-08-04 DIAGNOSIS — G8929 Other chronic pain: Secondary | ICD-10-CM | POA: Diagnosis not present

## 2017-08-04 DIAGNOSIS — M25561 Pain in right knee: Secondary | ICD-10-CM | POA: Diagnosis not present

## 2017-08-04 NOTE — Therapy (Signed)
[facility] [address] [place], [place], [address] Phone: [phone]   Fax:  [phone]  Physical Therapy Treatment  Patient Details  Name: Nicholas Gaines MRN: [id number] Date of Birth: Aug 26, 1928 Referring Provider: Ward Givens, NP   Encounter Date: 08/04/2017  PT End of Session - 08/04/17 1945    Visit Number  12    Number of Visits  17    Date for PT Re-Evaluation  08/19/17    Authorization Type  [employer]; G code and PN every 10th visit    PT Start Time  1104    PT Stop Time  1145    PT Time Calculation (min)  41 min    Activity Tolerance  Patient tolerated treatment well    Behavior During Therapy  [facility] for tasks assessed/performed       Past Medical History:  Diagnosis Date  . A-fib ([place])   . Arthritis   . GERD (gastroesophageal reflux disease)   . Heart murmur   . HLD (hyperlipidemia)   . Seizures ([place])    last sz 04/17/17  . Stroke [facility])    tia    Past Surgical History:  Procedure Laterality Date  . CARPAL TUNNEL RELEASE     About 10 yr ago (08/13/16)  . LOOP RECORDER INSERTION N/A 09/16/2016   Procedure: Loop Recorder Insertion;  Surgeon: Dullea Grayer, MD;  Location: [place] [facility];  Service: Cardiovascular;  Laterality: N/A;  . TONSILLECTOMY      There were no vitals filed for this visit.  Subjective Assessment - 08/04/17 1109    Subjective  States he knows he is walking bettter. Feels the sit to stand exercise has really been helping.     Pertinent History  seizures, OA, afib, heart murmur, HLD, carpal tunnel release, and CVA with loop recorder insertion in 09/2016, multiple falls    Limitations  Walking    Patient Stated Goals  To improve his walking and improve his shoulder and leg strength    Currently in Pain?  Yes    Pain Score  5     Pain Location  Shoulder    Pain Orientation  Left;Right    Pain Descriptors / Indicators  Aching    Pain Type  Chronic pain     Pain Onset  More than a month ago    Pain Frequency  Intermittent                      [facility] Adult PT Treatment/Exercise - 08/04/17 0001      Ambulation/Gait   Ambulation/Gait  Yes    Ambulation/Gait Assistance  4: Min assist;4: Min guard    Ambulation/Gait Assistance Details  Performed gait with focus on heelstrike and foot clearance, arm swing, and overall safety    Ambulation Distance (Feet)  220 Feet 120,120    Assistive device  None    Gait Pattern  Step-through pattern;Decreased arm swing - right;Decreased arm swing - left;Decreased stride length;Decreased dorsiflexion - right;Decreased dorsiflexion - left;Right foot flat;Left foot flat;Right flexed knee in stance;Left flexed knee in stance;Shuffle;Decreased trunk rotation    Ambulation Surface  Indoor    Pre-Gait Activities  standing with arms swings in opposition; walking with poles for PT to assist his UE swing;  // bars slow gait to emphasize heel strike to midstance (bil UE support to no UE support)      Knee/Hip Exercises: Standing   Heel Raises  Both;1 set;5 reps    Terminal Knee Extension  Strengthening;Both;2 sets;10 reps    Forward Step Up  Both;1 set;15 reps;Hand Hold: 2;Step Height: 6"    Step Down  Both;1 set;Hand Hold: 2;Step Height: 6"        PWR [facility]) - 08/04/17 1935    PWR! Rock  5 rt and lt; unable to reach with arms due to shoulder pain     PWR! Twist  5 great difficulty with sequencing    Comments  Patient with poor abilityt to complete PWR! exercises in standing; max cues and hands-on assist            PT Short Term Goals - 07/21/17 1120      PT SHORT TERM GOAL #1   Title  Will perform HEP (PWR, stretch, strengthen, balance) with supervision of daughter    Baseline  supervision with previous HEP, new HEP pt requires min-mod cues to perform correctly    Time  4    Period  Weeks    Status  Partially Met      PT SHORT TERM GOAL #2   Title  Will participate in further  assessment of LE ROM (hamstrings and hip flexors) and will initiate stretching HEP for increased hip and knee extension ROM    Time  4    Period  Weeks    Status  Achieved      PT SHORT TERM GOAL #3   Title  Will decrease five time sit to stand time to < or = 10 seconds from chair without UE support and will demonstrate increased terminal knee and hip extension during standing    Baseline  12.03 sec 07/14/17, provided with sit <> stand exercise on 12/6 five time sit to stand improved to 10.19 on 12/13    Time  4    Period  Weeks    Status  Achieved      PT SHORT TERM GOAL #4   Title  Will increase BERG balance score by 5 points from eval (41/56)    Baseline  47/56 07/14/17    Time  4    Period  Weeks    Status  Achieved      PT SHORT TERM GOAL #5   Title  Will increase gait speed to > or = 2.51f/sec    Baseline  2.21 ft/sec 07/14/17    Time  4    Period  Weeks    Status  Not Met        PT Long Term Goals - 07/28/17 1156      PT LONG TERM GOAL #1   Title  Pt will demonstrate independence with HEP and will identify community wellness to participate in at D/C    Time  8    Period  Weeks    Status  New    Target Date  08/19/17      PT LONG TERM GOAL #2   Title  Will demonstrate 10 deg increase in hip and knee extension ROM    Time  8    Period  Weeks    Status  New    Target Date  08/19/17      PT LONG TERM GOAL #3   Title  Will increase BERG balance score 10 points from initial assessment (41/56)    Baseline  47/56 at 4 weeks    Time  8    Period  Weeks    Status  New  Target Date  08/19/17      PT LONG TERM GOAL #4   Title  Will increase gait speed to > or = 3.0 ft/sec with improved foot clearance, trunk rotation, arm swing    Baseline  2.4 ft/sec, 2.5 ft/sec on 10th visit    Time  8    Period  Weeks    Status  New    Target Date  08/19/17      PT LONG TERM GOAL #5   Title  Pt will perform floor > stand transfer with UE support and supervision    Baseline   unable    Time  8    Period  Weeks    Status  New    Target Date  08/19/17      PT LONG TERM GOAL #6   Title  Will ambulate x 500' over uneven outdoor surfaces and negotiate 4 stairs with L rail, alternating sequence MOD I with improved upright posture, increased knee and hip extension, decreased shuffling during gait    Time  8    Period  Weeks    Status  New    Target Date  08/19/17            Plan - 08/04/17 1946    Clinical Impression Statement  Session focused on gait training with goal to improve foot clearance and decr risk of falling. Strengthening and ROM/stretching also incorporated to improve his gait. Patient can continue to benefit froem PT.    Rehab Potential  Good    PT Frequency  2x / week    PT Duration  8 weeks    PT Treatment/Interventions  ADLs/Self Care Home Management;Gait training;Stair training;Functional mobility training;Therapeutic activities;Therapeutic exercise;Balance training;Neuromuscular re-education;Patient/family education;Passive range of motion;Manual techniques;Taping    PT Next Visit Plan  gait with increased arm swing - walking poles; hip flexor stretch and terminal hip and knee extension training; bilat and single leg stance during red theraband terminal hip extension; continue to work on weightshift, foot clearance, postural exercises, lower extremity strengthening.    PT Home Exercise Plan  Floor transfers when appropriate.    Consulted and Agree with Plan of Care  Patient       Patient will benefit from skilled therapeutic intervention in order to improve the following deficits and impairments:  Abnormal gait, Decreased balance, Decreased range of motion, Decreased strength, Difficulty walking, Impaired flexibility, Postural dysfunction, Pain  Visit Diagnosis: Unsteadiness on feet  Muscle weakness (generalized)  Other abnormalities of gait and mobility     Problem List Patient Active Problem List   Diagnosis Date Noted  .  Palpitations 09/16/2016  . Anemia 09/09/2016  . Aortic valve calcification 09/09/2016  . Aortic valve sclerosis 09/09/2016  . BPH (benign prostatic hyperplasia) 09/09/2016  . Cardiac murmur 09/09/2016  . Essential (primary) hypertension 09/09/2016  . Facial weakness status post cerebrovascular accident 09/09/2016  . H/O: stroke with residual effects 09/09/2016  . History of stroke 09/09/2016  . Prediabetes 09/09/2016  . Vitamin D deficiency 09/09/2016  . Alteration of consciousness 08/14/2016  . Acute encephalopathy 08/02/2016  . Difficulty speaking 08/02/2016  . Bronchitis 08/02/2016  . Mixed hyperlipidemia 08/02/2016  . Stroke ([place])   . GERD (gastroesophageal reflux disease)   . Gastroesophageal reflux disease without esophagitis   . Transient cerebral ischemia     Rexanne Mano, PT 08/04/2017, 7:49 PM  [facility] [address] [place], [place], [address] Phone: [phone]  Fax:  [phone]  Name: Nicholas Gaines MRN: [id number] Date of Birth: 1929-07-02

## 2017-08-08 ENCOUNTER — Ambulatory Visit: Payer: Medicare Other | Admitting: Physical Therapy

## 2017-08-11 ENCOUNTER — Encounter: Payer: Self-pay | Admitting: Physical Therapy

## 2017-08-11 ENCOUNTER — Ambulatory Visit: Payer: Medicare Other | Attending: Adult Health | Admitting: Physical Therapy

## 2017-08-11 DIAGNOSIS — R2689 Other abnormalities of gait and mobility: Secondary | ICD-10-CM | POA: Diagnosis not present

## 2017-08-11 DIAGNOSIS — G8929 Other chronic pain: Secondary | ICD-10-CM | POA: Diagnosis not present

## 2017-08-11 DIAGNOSIS — M6281 Muscle weakness (generalized): Secondary | ICD-10-CM

## 2017-08-11 DIAGNOSIS — M25561 Pain in right knee: Secondary | ICD-10-CM | POA: Diagnosis not present

## 2017-08-11 DIAGNOSIS — R296 Repeated falls: Secondary | ICD-10-CM | POA: Insufficient documentation

## 2017-08-11 DIAGNOSIS — M25562 Pain in left knee: Secondary | ICD-10-CM | POA: Diagnosis not present

## 2017-08-11 DIAGNOSIS — R2681 Unsteadiness on feet: Secondary | ICD-10-CM

## 2017-08-11 NOTE — Therapy (Signed)
[facility] [address] [place], [place], [address] Phone: [phone]   Fax:  [phone]  Physical Therapy Treatment  Patient Details  Name: Nicholas Gaines MRN: [id number] Date of Birth: 02/23/1929 Referring Provider: Ward Givens, NP   Encounter Date: 08/11/2017  PT End of Session - 08/11/17 1154    Visit Number  13    Number of Visits  17    Date for PT Re-Evaluation  08/19/17    Authorization Type  [employer] Medicare    PT Start Time  1105    PT Stop Time  1150    PT Time Calculation (min)  45 min    Activity Tolerance  Patient tolerated treatment well    Behavior During Therapy  [facility] for tasks assessed/performed       Past Medical History:  Diagnosis Date  . A-fib ([place])   . Arthritis   . GERD (gastroesophageal reflux disease)   . Heart murmur   . HLD (hyperlipidemia)   . Seizures ([place])    last sz 04/17/17  . Stroke [facility])    tia    Past Surgical History:  Procedure Laterality Date  . CARPAL TUNNEL RELEASE     About 10 yr ago (08/13/16)  . LOOP RECORDER INSERTION N/A 09/16/2016   Procedure: Loop Recorder Insertion;  Surgeon: Coye Grayer, MD;  Location: [facility];  Service: Cardiovascular;  Laterality: N/A;  . TONSILLECTOMY      There were no vitals filed for this visit.  Subjective Assessment - 08/11/17 1108    Subjective  Stood up quickly from waiting room chair; still doing exercises with some shoulder soreness but overall doing well.    Pertinent History  seizures, OA, afib, heart murmur, HLD, carpal tunnel release, and CVA with loop recorder insertion in 09/2016, multiple falls    Limitations  Walking    Patient Stated Goals  To improve his walking and improve his shoulder and leg strength    Currently in Pain?  Yes    Pain Score  5     Pain Location  Shoulder    Pain Orientation  Right;Left    Pain Descriptors / Indicators  Sore    Pain Onset  More than a month ago                       [facility] Adult PT Treatment/Exercise - 08/11/17 1115      Transfers   Transfers  Floor to Transfer    Floor to Transfer  6: Modified independent (Device/Increase time);With upper extremity assist bilat > unilat UE; R foot forwards, L foot forwards      Knee/Hip Exercises: Standing   Hip Flexion  Stengthening;Right;Left;5 reps;Knee bent after sit > stand, standing on compliant foam      Knee/Hip Exercises: Seated   Sit to Sand  15 reps;with UE support;without UE support feet on compliant surface          Balance Exercises - 08/11/17 1140      Balance Exercises: Standing   Step Over Hurdles / Cones  2 sets x 4 hurdles each: forwards and R/L laterally; one set x 4 hurdles backwards with min-mod A for balance during weight shifting, SLS and to improve foot clearance and step length        PT Education - 08/11/17 1153    Education provided  Yes    Education Details  floor <> stand transfer technique  Person(s) Educated  Patient    Methods  Explanation;Demonstration    Comprehension  Verbalized understanding;Returned demonstration       PT Short Term Goals - 07/21/17 1120      PT SHORT TERM GOAL #1   Title  Will perform HEP (PWR, stretch, strengthen, balance) with supervision of daughter    Baseline  supervision with previous HEP, new HEP pt requires min-mod cues to perform correctly    Time  4    Period  Weeks    Status  Partially Met      PT SHORT TERM GOAL #2   Title  Will participate in further assessment of LE ROM (hamstrings and hip flexors) and will initiate stretching HEP for increased hip and knee extension ROM    Time  4    Period  Weeks    Status  Achieved      PT SHORT TERM GOAL #3   Title  Will decrease five time sit to stand time to < or = 10 seconds from chair without UE support and will demonstrate increased terminal knee and hip extension during standing    Baseline  12.03 sec 07/14/17, provided with sit <> stand exercise  on 12/6 five time sit to stand improved to 10.19 on 12/13    Time  4    Period  Weeks    Status  Achieved      PT SHORT TERM GOAL #4   Title  Will increase BERG balance score by 5 points from eval (41/56)    Baseline  47/56 07/14/17    Time  4    Period  Weeks    Status  Achieved      PT SHORT TERM GOAL #5   Title  Will increase gait speed to > or = 2.59f/sec    Baseline  2.21 ft/sec 07/14/17    Time  4    Period  Weeks    Status  Not Met        PT Long Term Goals - 08/11/17 1200      PT LONG TERM GOAL #1   Title  Pt will demonstrate independence with HEP and will identify community wellness to participate in at D/C    Time  8    Period  Weeks    Status  New    Target Date  08/19/17      PT LONG TERM GOAL #2   Title  Will demonstrate 10 deg increase in hip and knee extension ROM    Time  8    Period  Weeks    Status  New    Target Date  08/19/17      PT LONG TERM GOAL #3   Title  Will increase BERG balance score 10 points from initial assessment (41/56)    Baseline  47/56 at 4 weeks    Time  8    Period  Weeks    Status  New    Target Date  08/19/17      PT LONG TERM GOAL #4   Title  Will increase gait speed to > or = 3.0 ft/sec with improved foot clearance, trunk rotation, arm swing    Baseline  2.4 ft/sec, 2.5 ft/sec on 10th visit    Time  8    Period  Weeks    Status  New    Target Date  08/19/17      PT LONG TERM GOAL #5   Title  Pt  will perform floor > stand transfer with UE support and supervision    Baseline  MOD I on 08/11/17    Time  8    Period  Weeks    Status  Achieved    Target Date  08/19/17      PT LONG TERM GOAL #6   Title  Will ambulate x 500' over uneven outdoor surfaces and negotiate 4 stairs with L rail, alternating sequence MOD I with improved upright posture, increased knee and hip extension, decreased shuffling during gait    Time  8    Period  Weeks    Status  New    Target Date  08/19/17            Plan - 08/11/17 1155     Clinical Impression Statement  Initiated floor <> stand transfer training.  Pt able to perform transfer at MOD I level from tall kneeling > half kneeling > stand with bilat and unilat UE support; unable to balance without UE support.  Recommended to pt having a chair or bench near by to assist with returning to stand when working on the floor in his workshop.  Continued to progress LE strengthening and balance training with sit <> stand without UE support but feet on compliant foam and then incorporating SLS and alternating marching.  Continued higher level gait training with stepping over hurdles in various directions.  Pt required increased hands on assistance for balance when stepping over obstacles; will continue to address and progress towards LTG.    Rehab Potential  Good    PT Frequency  2x / week    PT Duration  8 weeks    PT Treatment/Interventions  ADLs/Self Care Home Management;Gait training;Stair training;Functional mobility training;Therapeutic activities;Therapeutic exercise;Balance training;Neuromuscular re-education;Patient/family education;Passive range of motion;Manual techniques;Taping    PT Next Visit Plan  obstacle negotiation; step ups for LE strength, gait with increased arm swing - walking poles; hip flexor stretch and terminal hip and knee extension training; bilat and single leg stance during red theraband terminal hip extension; continue to work on weightshift, foot clearance, postural exercises, lower extremity strengthening.    Consulted and Agree with Plan of Care  Patient       Patient will benefit from skilled therapeutic intervention in order to improve the following deficits and impairments:  Abnormal gait, Decreased balance, Decreased range of motion, Decreased strength, Difficulty walking, Impaired flexibility, Postural dysfunction, Pain  Visit Diagnosis: Unsteadiness on feet  Muscle weakness (generalized)  Other abnormalities of gait and mobility  Chronic  pain of left knee  Chronic pain of right knee  Repeated falls     Problem List Patient Active Problem List   Diagnosis Date Noted  . Palpitations 09/16/2016  . Anemia 09/09/2016  . Aortic valve calcification 09/09/2016  . Aortic valve sclerosis 09/09/2016  . BPH (benign prostatic hyperplasia) 09/09/2016  . Cardiac murmur 09/09/2016  . Essential (primary) hypertension 09/09/2016  . Facial weakness status post cerebrovascular accident 09/09/2016  . H/O: stroke with residual effects 09/09/2016  . History of stroke 09/09/2016  . Prediabetes 09/09/2016  . Vitamin D deficiency 09/09/2016  . Alteration of consciousness 08/14/2016  . Acute encephalopathy 08/02/2016  . Difficulty speaking 08/02/2016  . Bronchitis 08/02/2016  . Mixed hyperlipidemia 08/02/2016  . Stroke ([place])   . GERD (gastroesophageal reflux disease)   . Gastroesophageal reflux disease without esophagitis   . Transient cerebral ischemia     Rico Junker, PT, DPT 08/11/17    12:02  PM    [facility] [address] [place], [place], [address] Phone: [phone]   Fax:  [phone]  Name: Nicholas Gaines MRN: [id number] Date of Birth: 1928/10/22

## 2017-08-12 ENCOUNTER — Ambulatory Visit (INDEPENDENT_AMBULATORY_CARE_PROVIDER_SITE_OTHER): Payer: Medicare Other | Admitting: *Deleted

## 2017-08-12 DIAGNOSIS — R002 Palpitations: Secondary | ICD-10-CM

## 2017-08-15 ENCOUNTER — Ambulatory Visit: Payer: Medicare Other | Admitting: Physical Therapy

## 2017-08-15 NOTE — Progress Notes (Signed)
Carelink Summary Report / Loop Recorder 

## 2017-08-18 ENCOUNTER — Encounter: Payer: Self-pay | Admitting: Physical Therapy

## 2017-08-18 ENCOUNTER — Ambulatory Visit: Payer: Medicare Other | Admitting: Physical Therapy

## 2017-08-18 DIAGNOSIS — R2681 Unsteadiness on feet: Secondary | ICD-10-CM

## 2017-08-18 DIAGNOSIS — G8929 Other chronic pain: Secondary | ICD-10-CM | POA: Diagnosis not present

## 2017-08-18 DIAGNOSIS — M6281 Muscle weakness (generalized): Secondary | ICD-10-CM | POA: Diagnosis not present

## 2017-08-18 DIAGNOSIS — M25561 Pain in right knee: Secondary | ICD-10-CM

## 2017-08-18 DIAGNOSIS — R296 Repeated falls: Secondary | ICD-10-CM

## 2017-08-18 DIAGNOSIS — M25562 Pain in left knee: Secondary | ICD-10-CM

## 2017-08-18 DIAGNOSIS — R2689 Other abnormalities of gait and mobility: Secondary | ICD-10-CM | POA: Diagnosis not present

## 2017-08-18 NOTE — Therapy (Signed)
[facility] [address] [place], [place], [address] Phone: [phone]   Fax:  [phone]  Physical Therapy Treatment  Patient Details  Name: Nicholas Gaines MRN: [id number] Date of Birth: 18-Feb-1929 Referring Provider: Ward Givens, NP   Encounter Date: 08/18/2017  PT End of Session - 08/18/17 1204    Visit Number  14    Number of Visits  17    Date for PT Re-Evaluation  08/19/17 D/C next session    Authorization Type  [employer]    PT Start Time  1112    PT Stop Time  1152    PT Time Calculation (min)  40 min    Activity Tolerance  Patient tolerated treatment well    Behavior During Therapy  [facility] for tasks assessed/performed       Past Medical History:  Diagnosis Date  . A-fib ([place])   . Arthritis   . GERD (gastroesophageal reflux disease)   . Heart murmur   . HLD (hyperlipidemia)   . Seizures ([place])    last sz 04/17/17  . Stroke [facility])    tia    Past Surgical History:  Procedure Laterality Date  . CARPAL TUNNEL RELEASE     About 10 yr ago (08/13/16)  . LOOP RECORDER INSERTION N/A 09/16/2016   Procedure: Loop Recorder Insertion;  Surgeon: [place] Grayer, MD;  Location: [place];  Service: Cardiovascular;  Laterality: N/A;  . TONSILLECTOMY      There were no vitals filed for this visit.  Subjective Assessment - 08/18/17 1117    Subjective  Pt feeling better, had a cold and was not feeling well earlier this week.  Was sitting on low stool at home and fell off but was able to get up without any difficulty.    Pertinent History  seizures, OA, afib, heart murmur, HLD, carpal tunnel release, and CVA with loop recorder insertion in 09/2016, multiple falls    Limitations  Walking    Patient Stated Goals  To improve his walking and improve his shoulder and leg strength    Currently in Pain?  No/denies    Pain Onset  More than a month ago         [facility] PT Assessment - 08/18/17 1122       Observation/Other Assessments   Focus on Therapeutic Outcomes (FOTO)   Not assessed      PROM   Overall PROM Comments  Hip Extension Thomas test: -12 on RLE, -18 on LLE.  Knees: LLE lacking 10 deg to full extension, RLE lacking 4 deg to full extension      Ambulation/Gait   Ambulation/Gait  Yes    Ambulation/Gait Assistance  6: Modified independent (Device/Increase time)    Ambulation/Gait Assistance Details  more upright posture, increased foot clearance, step length and heel strike noted    Ambulation Distance (Feet)  250 Feet    Assistive device  None    Ambulation Surface  Level;Indoor    Stairs  Yes    Stairs Assistance  5: Supervision    Stairs Assistance Details (indicate cue type and reason)  with alternating sequence pt required cues for safe foot placement and full foot clearance when descending due to pt catching his heel on the step    Stair Management Technique  One rail Left;One rail Right;Alternating pattern;Forwards    Number of Stairs  8    Height of Stairs  6  Standardized Balance Assessment   Standardized Balance Assessment  Berg Balance Test;10 meter walk test    10 Meter Walk  12.59 or 2.6 ft/sec with more upright posture, increased foot clearance and heel strike noted.      Berg Balance Test   Sit to Stand  Able to stand without using hands and stabilize independently    Standing Unsupported  Able to stand safely 2 minutes    Sitting with Back Unsupported but Feet Supported on Floor or Stool  Able to sit safely and securely 2 minutes    Stand to Sit  Sits safely with minimal use of hands    Transfers  Able to transfer safely, minor use of hands    Standing Unsupported with Eyes Closed  Able to stand 10 seconds safely    Standing Ubsupported with Feet Together  Able to place feet together independently and stand 1 minute safely    From Standing, Reach Forward with Outstretched Arm  Can reach forward >12 cm safely (5")    From Standing Position, Pick up  Object from Floor  Able to pick up shoe safely and easily    From Standing Position, Turn to Look Behind Over each Shoulder  Turn sideways only but maintains balance    Turn 360 Degrees  Able to turn 360 degrees safely but slowly    Standing Unsupported, Alternately Place Feet on Step/Stool  Able to stand independently and safely and complete 8 steps in 20 seconds    Standing Unsupported, One Foot in Front  Able to plae foot ahead of the other independently and hold 30 seconds    Standing on One Leg  Tries to lift leg/unable to hold 3 seconds but remains standing independently    Total Score  47    Berg comment:  47/56 moderate risk for falls                          PT Education - 08/18/17 1204    Education provided  Yes    Education Details  progress towards goals    Person(s) Educated  Patient    Methods  Explanation    Comprehension  Verbalized understanding       PT Short Term Goals - 07/21/17 1120      PT SHORT TERM GOAL #1   Title  Will perform HEP (PWR, stretch, strengthen, balance) with supervision of daughter    Baseline  supervision with previous HEP, new HEP pt requires min-mod cues to perform correctly    Time  4    Period  Weeks    Status  Partially Met      PT SHORT TERM GOAL #2   Title  Will participate in further assessment of LE ROM (hamstrings and hip flexors) and will initiate stretching HEP for increased hip and knee extension ROM    Time  4    Period  Weeks    Status  Achieved      PT SHORT TERM GOAL #3   Title  Will decrease five time sit to stand time to < or = 10 seconds from chair without UE support and will demonstrate increased terminal knee and hip extension during standing    Baseline  12.03 sec 07/14/17, provided with sit <> stand exercise on 12/6 five time sit to stand improved to 10.19 on 12/13    Time  4    Period  Weeks    Status  Achieved      PT SHORT TERM GOAL #4   Title  Will increase BERG balance score by 5 points  from eval (41/56)    Baseline  47/56 07/14/17    Time  4    Period  Weeks    Status  Achieved      PT SHORT TERM GOAL #5   Title  Will increase gait speed to > or = 2.2f/sec    Baseline  2.21 ft/sec 07/14/17    Time  4    Period  Weeks    Status  Not Met        PT Long Term Goals - 08/18/17 1121      PT LONG TERM GOAL #1   Title  Pt will demonstrate independence with HEP and will identify community wellness to participate in at D/C    Time  8    Period  Weeks    Status  On-going      PT LONG TERM GOAL #2   Title  Will demonstrate 10 deg increase in hip and knee extension ROM    Baseline   but functionally pt demonstrates more upright posture, heel strike and knee extension in stance.    Time  8    Period  Weeks    Status  Not Met      PT LONG TERM GOAL #3   Title  Will increase BERG balance score 10 points from initial assessment (41/56)    Baseline  47/56 improved by 7 points    Time  8    Period  Weeks    Status  Partially Met      PT LONG TERM GOAL #4   Title  Will increase gait speed to > or = 3.0 ft/sec with improved foot clearance, trunk rotation, arm swing    Baseline  2.6 ft/sec     Time  8    Period  Weeks    Status  Partially Met      PT LONG TERM GOAL #5   Title  Pt will perform floor > stand transfer with UE support and supervision    Baseline  MOD I on 08/11/17    Time  8    Period  Weeks    Status  Achieved      PT LONG TERM GOAL #6   Title  Will ambulate x 500' over uneven outdoor surfaces and negotiate 4 stairs with L rail, alternating sequence MOD I with improved upright posture, increased knee and hip extension, decreased shuffling during gait    Time  8    Period  Weeks    Status  On-going            Plan - 08/18/17 1205    Clinical Impression Statement  Initiated assessment of LTG - pt has met 1/6 LTG demonstrating independent stand from floor; pt demonstrated improvement in standing balance and gait velocity but not to goal level.   Pt did not demonstrate improvement in hip and knee extension PROM but functionally demonstrates more upright posture, improved heel strike and foot clearance during gait; will finish assessment next session with continue stair negotiation training at next session.  Pt's progress has remained fairly stable over the past 4 weeks, pt has decreased overall falls risk, and pt is pleased with progress; will likely D/C at next session    Rehab Potential  Good    PT Frequency  2x / week    PT Duration  8 weeks    PT Treatment/Interventions  ADLs/Self Care Home Management;Gait training;Stair training;Functional mobility training;Therapeutic activities;Therapeutic exercise;Balance training;Neuromuscular re-education;Patient/family education;Passive range of motion;Manual techniques;Taping    PT Next Visit Plan  finish LTG: HEP, gait outside, finish stair training decrease heel drag on stairs when descending.  D/C    Consulted and Agree with Plan of Care  Patient       Patient will benefit from skilled therapeutic intervention in order to improve the following deficits and impairments:  Abnormal gait, Decreased balance, Decreased range of motion, Decreased strength, Difficulty walking, Impaired flexibility, Postural dysfunction, Pain  Visit Diagnosis: Unsteadiness on feet  Muscle weakness (generalized)  Other abnormalities of gait and mobility  Chronic pain of left knee  Chronic pain of right knee  Repeated falls     Problem List Patient Active Problem List   Diagnosis Date Noted  . Palpitations 09/16/2016  . Anemia 09/09/2016  . Aortic valve calcification 09/09/2016  . Aortic valve sclerosis 09/09/2016  . BPH (benign prostatic hyperplasia) 09/09/2016  . Cardiac murmur 09/09/2016  . Essential (primary) hypertension 09/09/2016  . Facial weakness status post cerebrovascular accident 09/09/2016  . H/O: stroke with residual effects 09/09/2016  . History of stroke 09/09/2016  . Prediabetes  09/09/2016  . Vitamin D deficiency 09/09/2016  . Alteration of consciousness 08/14/2016  . Acute encephalopathy 08/02/2016  . Difficulty speaking 08/02/2016  . Bronchitis 08/02/2016  . Mixed hyperlipidemia 08/02/2016  . Stroke ([place])   . GERD (gastroesophageal reflux disease)   . Gastroesophageal reflux disease without esophagitis   . Transient cerebral ischemia     Rico Junker, PT, DPT 08/18/17    12:27 PM    [facility] [address] [place], [place], [address] Phone: [phone]   Fax:  [phone]  Name: Nicholas Gaines MRN: [id number] Date of Birth: 1928/12/08

## 2017-08-22 ENCOUNTER — Ambulatory Visit: Payer: Medicare Other | Admitting: Physical Therapy

## 2017-08-22 ENCOUNTER — Encounter: Payer: Self-pay | Admitting: Physical Therapy

## 2017-08-22 DIAGNOSIS — R2681 Unsteadiness on feet: Secondary | ICD-10-CM | POA: Diagnosis not present

## 2017-08-22 DIAGNOSIS — G8929 Other chronic pain: Secondary | ICD-10-CM

## 2017-08-22 DIAGNOSIS — M25561 Pain in right knee: Secondary | ICD-10-CM

## 2017-08-22 DIAGNOSIS — R296 Repeated falls: Secondary | ICD-10-CM

## 2017-08-22 DIAGNOSIS — M25562 Pain in left knee: Secondary | ICD-10-CM | POA: Diagnosis not present

## 2017-08-22 DIAGNOSIS — R2689 Other abnormalities of gait and mobility: Secondary | ICD-10-CM

## 2017-08-22 DIAGNOSIS — M6281 Muscle weakness (generalized): Secondary | ICD-10-CM

## 2017-08-22 NOTE — Patient Instructions (Signed)
PELVIC TILT: Anterior  PLACE A ROLLED TOWEL SIDEWAYS AT LOW BACK, ANOTHER TOWEL ROLL STRAIGHT UP AND DOWN SPINE.  BREATHING IN AS YOU SIT UP TALL, SHOULDERS BACK BREATHE OUT AS YOU RELAX DOWN    Start in slumped position. Roll pelvis forward to arch back. __8_ reps per set, _2__ sets per day.   Copyright  VHI. All rights reserved. Chair Sitting    Sit at edge of seat, one leg extended, pull toes up towards you, sit "tall" spine straight. Bend forward from the hip, keeping spine straight. toward toes. Hold __20-30_ seconds. Repeat __2-3_ times per session. Do __3-5_ sessions per day.   Functional Quadriceps: Sit to Stand    Sit on edge of chair, feet flat on floor. Lean forward and stand upright, extending knees fully. Slowly return to sitting Repeat __12-15__ times per set. Do __1-2__ sessions per day.   HIP: Abduction - Standing    Squeeze glutes. Raise leg out and slightly back. _10__ reps per side, __2_ sets per day. Hold onto a support.  Hip Extension (Standing)    Stand with support. Squeeze pelvic floor and hold. Move right leg backward with straight knee. Hold for __1-2_ seconds. Repeat __10_ times per side. Do __2_ times a day.  FUNCTIONAL MOBILITY: Marching - Standing    Hold onto a support of counter top, perform high knee marching forwards walking along counter.  Repeat 4-6 times lifting each knee high in the air.  Feet Heel-Toe "Tandem"    One hand holding on to counter top: walk a straight line bringing one foot directly in front of the other. Perform 4-6 laps along counter top.

## 2017-08-22 NOTE — Therapy (Signed)
[facility] [address] [place], [place], [address] Phone: [phone]   Fax:  [phone]  Physical Therapy Treatment and D/C Summary  Patient Details  Name: Nicholas Gaines MRN: [id number] Date of Birth: 1929/06/23 Referring Provider: Ward Givens, NP   Encounter Date: 08/22/2017  PT End of Session - 08/22/17 1242    Visit Number  15    Number of Visits  17    Date for PT Re-Evaluation  08/19/17    Authorization Type  [employer]    PT Start Time  1149    PT Stop Time  1229    PT Time Calculation (min)  40 min    Activity Tolerance  Patient tolerated treatment well    Behavior During Therapy  [facility] for tasks assessed/performed       Past Medical History:  Diagnosis Date  . A-fib ([place])   . Arthritis   . GERD (gastroesophageal reflux disease)   . Heart murmur   . HLD (hyperlipidemia)   . Seizures ([place])    last sz 04/17/17  . Stroke [facility])    tia    Past Surgical History:  Procedure Laterality Date  . CARPAL TUNNEL RELEASE     About 10 yr ago (08/13/16)  . LOOP RECORDER INSERTION N/A 09/16/2016   Procedure: Loop Recorder Insertion;  Surgeon: Laski Grayer, MD;  Location: [facility];  Service: Cardiovascular;  Laterality: N/A;  . TONSILLECTOMY      There were no vitals filed for this visit.  Subjective Assessment - 08/22/17 1153    Subjective  Pt's daughter returns with pt for last visit.  No issues over the weekend; got a lot of ice this weekend.      Pertinent History  seizures, OA, afib, heart murmur, HLD, carpal tunnel release, and CVA with loop recorder insertion in 09/2016, multiple falls    Limitations  Walking    Patient Stated Goals  To improve his walking and improve his shoulder and leg strength    Currently in Pain?  No/denies    Pain Onset  More than a month ago                      [facility] Adult PT Treatment/Exercise - 08/22/17 1249      Ambulation/Gait    Ambulation/Gait  Yes    Ambulation/Gait Assistance  6: Modified independent (Device/Increase time)    Ambulation/Gait Assistance Details  improved upright posture and knee extension with increased heel strike; shuffling increases as pt fatigues    Ambulation Distance (Feet)  500 Feet    Assistive device  None    Gait Pattern  Step-through pattern;Decreased step length - right;Decreased step length - left;Decreased stride length;Decreased dorsiflexion - right;Decreased dorsiflexion - left;Shuffle;Poor foot clearance - left;Poor foot clearance - right    Ambulation Surface  Level;Unlevel;Indoor;Outdoor;Paved    Stairs  Yes    Stairs Assistance  6: Modified independent (Device/Increase time)    Stair Management Technique  One rail Right;Alternating pattern;Forwards    Number of Stairs  12    Height of Stairs  6     PELVIC TILT: Anterior  PLACE A ROLLED TOWEL SIDEWAYS AT LOW BACK, ANOTHER TOWEL ROLL STRAIGHT UP AND DOWN SPINE.  BREATHING IN AS YOU SIT UP TALL, SHOULDERS BACK BREATHE OUT AS YOU RELAX DOWN    Start in slumped position. Roll pelvis forward to arch back. __8_ reps per set, _2__ sets  per day.   Copyright  VHI. All rights reserved. Chair Sitting    Sit at edge of seat, one leg extended, pull toes up towards you, sit "tall" spine straight. Bend forward from the hip, keeping spine straight. toward toes. Hold __20-30_ seconds. Repeat __2-3_ times per session. Do __3-5_ sessions per day.   Functional Quadriceps: Sit to Stand    Sit on edge of chair, feet flat on floor. Lean forward and stand upright, extending knees fully. Slowly return to sitting Repeat __12-15__ times per set. Do __1-2__ sessions per day.   HIP: Abduction - Standing    Squeeze glutes. Raise leg out and slightly back. _10__ reps per side, __2_ sets per day. Hold onto a support.  Hip Extension (Standing)    Stand with support. Squeeze pelvic floor and hold. Move right leg  backward with straight knee. Hold for __1-2_ seconds. Repeat __10_ times per side. Do __2_ times a day.  FUNCTIONAL MOBILITY: Marching - Standing    Hold onto a support of counter top, perform high knee marching forwards walking along counter.  Repeat 4-6 times lifting each knee high in the air.  Feet Heel-Toe "Tandem"    One hand holding on to counter top: walk a straight line bringing one foot directly in front of the other. Perform 4-6 laps along counter top.        PT Education - 08/22/17 1241    Education provided  Yes    Education Details  finalized HEP, final progress towards goals    Person(s) Educated  Patient;Child(ren)    Methods  Explanation;Demonstration;Handout    Comprehension  Verbalized understanding;Returned demonstration       PT Short Term Goals - 07/21/17 1120      PT SHORT TERM GOAL #1   Title  Will perform HEP (PWR, stretch, strengthen, balance) with supervision of daughter    Baseline  supervision with previous HEP, new HEP pt requires min-mod cues to perform correctly    Time  4    Period  Weeks    Status  Partially Met      PT SHORT TERM GOAL #2   Title  Will participate in further assessment of LE ROM (hamstrings and hip flexors) and will initiate stretching HEP for increased hip and knee extension ROM    Time  4    Period  Weeks    Status  Achieved      PT SHORT TERM GOAL #3   Title  Will decrease five time sit to stand time to < or = 10 seconds from chair without UE support and will demonstrate increased terminal knee and hip extension during standing    Baseline  12.03 sec 07/14/17, provided with sit <> stand exercise on 12/6 five time sit to stand improved to 10.19 on 12/13    Time  4    Period  Weeks    Status  Achieved      PT SHORT TERM GOAL #4   Title  Will increase BERG balance score by 5 points from eval (41/56)    Baseline  47/56 07/14/17    Time  4    Period  Weeks    Status  Achieved      PT SHORT TERM GOAL #5    Title  Will increase gait speed to > or = 2.24f/sec    Baseline  2.21 ft/sec 07/14/17    Time  4    Period  Weeks    Status  Not Met        PT Long Term Goals - 08/22/17 1248      PT LONG TERM GOAL #1   Title  Pt will demonstrate independence with HEP and will identify community wellness to participate in at D/C    Time  8    Period  Weeks    Status  Achieved      PT LONG TERM GOAL #2   Title  Will demonstrate 10 deg increase in hip and knee extension ROM    Baseline   but functionally pt demonstrates more upright posture, heel strike and knee extension in stance.    Time  8    Period  Weeks    Status  Not Met      PT LONG TERM GOAL #3   Title  Will increase BERG balance score 10 points from initial assessment (41/56)    Baseline  47/56 improved by 7 points    Time  8    Period  Weeks    Status  Partially Met      PT LONG TERM GOAL #4   Title  Will increase gait speed to > or = 3.0 ft/sec with improved foot clearance, trunk rotation, arm swing    Baseline  2.6 ft/sec     Time  8    Period  Weeks    Status  Partially Met      PT LONG TERM GOAL #5   Title  Pt will perform floor > stand transfer with UE support and supervision    Baseline  MOD I on 08/11/17    Time  8    Period  Weeks    Status  Achieved      PT LONG TERM GOAL #6   Title  Will ambulate x 500' over uneven outdoor surfaces and negotiate 4 stairs with L rail, alternating sequence MOD I with improved upright posture, increased knee and hip extension, decreased shuffling during gait    Time  8    Period  Weeks    Status  Achieved            Plan - 08/22/17 1251    Clinical Impression Statement  Pt has made good progress and has met 3/6 LTG.  Pt is at decreased risk for falls compared to evaluation and demonstrates improved static and dynamic standing balance, improved functional LE ROM and strength for sit <> stand and floor <> stand transfers, increased gait speed and safer gait technique.  Pt and  daughter are pleased with pt's progress; pt provided with maintenance HEP and walking program.  Pt is ready for D/C today.    Rehab Potential  Good    PT Frequency  2x / week    PT Duration  8 weeks    PT Treatment/Interventions  ADLs/Self Care Home Management;Gait training;Stair training;Functional mobility training;Therapeutic activities;Therapeutic exercise;Balance training;Neuromuscular re-education;Patient/family education;Passive range of motion;Manual techniques;Taping    PT Next Visit Plan  D/C    Consulted and Agree with Plan of Care  Patient;Family member/caregiver    Family Member Consulted  Daughter       Patient will benefit from skilled therapeutic intervention in order to improve the following deficits and impairments:  Abnormal gait, Decreased balance, Decreased range of motion, Decreased strength, Difficulty walking, Impaired flexibility, Postural dysfunction, Pain  Visit Diagnosis: Unsteadiness on feet  Muscle weakness (generalized)  Other abnormalities of gait and mobility  Chronic pain of right knee  Chronic pain of  left knee  Repeated falls     Problem List Patient Active Problem List   Diagnosis Date Noted  . Palpitations 09/16/2016  . Anemia 09/09/2016  . Aortic valve calcification 09/09/2016  . Aortic valve sclerosis 09/09/2016  . BPH (benign prostatic hyperplasia) 09/09/2016  . Cardiac murmur 09/09/2016  . Essential (primary) hypertension 09/09/2016  . Facial weakness status post cerebrovascular accident 09/09/2016  . H/O: stroke with residual effects 09/09/2016  . History of stroke 09/09/2016  . Prediabetes 09/09/2016  . Vitamin D deficiency 09/09/2016  . Alteration of consciousness 08/14/2016  . Acute encephalopathy 08/02/2016  . Difficulty speaking 08/02/2016  . Bronchitis 08/02/2016  . Mixed hyperlipidemia 08/02/2016  . Stroke ([place])   . GERD (gastroesophageal reflux disease)   . Gastroesophageal reflux disease without esophagitis   .  Transient cerebral ischemia     PHYSICAL THERAPY DISCHARGE SUMMARY  Visits from Start of Care: 15  Current functional level related to goals / functional outcomes: See impression statement and LTG above.   Remaining deficits: Impaired LE ROM, strength, impaired balance and gait   Education / Equipment: HEP  Plan: Patient agrees to discharge.  Patient goals were partially met. Patient is being discharged due to being pleased with the current functional level.  ?????      Rico Junker, PT, DPT 08/22/17    12:59 PM    [facility] [address] [place], [place], [address] Phone: [phone]   Fax:  [phone]  Name: Nicholas Gaines MRN: [id number] Date of Birth: [birth year]

## 2017-08-26 LAB — CUP PACEART REMOTE DEVICE CHECK
Implantable Pulse Generator Implant Date: 20180208
MDC IDC SESS DTM: 20190104194040

## 2017-09-11 ENCOUNTER — Telehealth: Payer: Self-pay | Admitting: *Deleted

## 2017-09-11 NOTE — Telephone Encounter (Signed)
Called & spoke with pt. I informed pt that Dr. Lucia GaskinsAhern will be out of the office so his appt scheduled for 2/4 will need to be r/s. He verbalized understanding that he will not be coming in tomorrow and stated that he will have his  Daughter call the office to r/s.

## 2017-09-12 ENCOUNTER — Ambulatory Visit: Payer: Medicare Other | Admitting: Neurology

## 2017-09-12 ENCOUNTER — Ambulatory Visit (INDEPENDENT_AMBULATORY_CARE_PROVIDER_SITE_OTHER): Payer: Medicare Other | Admitting: *Deleted

## 2017-09-12 DIAGNOSIS — R002 Palpitations: Secondary | ICD-10-CM

## 2017-09-12 NOTE — Progress Notes (Signed)
Carelink Summary Reprot / Loop Recorder 

## 2017-09-19 ENCOUNTER — Encounter: Payer: Self-pay | Admitting: Neurology

## 2017-09-19 ENCOUNTER — Ambulatory Visit: Payer: Medicare Other | Admitting: Neurology

## 2017-09-19 VITALS — BP 137/80 | HR 82 | Ht 65.5 in | Wt 157.0 lb

## 2017-09-19 DIAGNOSIS — R4189 Other symptoms and signs involving cognitive functions and awareness: Secondary | ICD-10-CM

## 2017-09-19 DIAGNOSIS — G40209 Localization-related (focal) (partial) symptomatic epilepsy and epileptic syndromes with complex partial seizures, not intractable, without status epilepticus: Secondary | ICD-10-CM | POA: Diagnosis not present

## 2017-09-19 MED ORDER — DIVALPROEX SODIUM 500 MG PO DR TAB: 500.0000 mg | DELAYED_RELEASE_TABLET | Freq: Two times a day (BID) | ORAL | 11 refills | Status: DC

## 2017-09-19 MED ORDER — DIVALPROEX SODIUM 500 MG PO DR TAB: 500.0000 mg | DELAYED_RELEASE_TABLET | Freq: Two times a day (BID) | ORAL | 2 refills | Status: DC

## 2017-09-19 NOTE — Patient Instructions (Addendum)
Decrease Keppra to once daily and decrease the vimpat to once daily for a week Week 2: stop Keppra and vimpat Start Depakote twice daily  Valproic Acid, Divalproex Sodium delayed or extended-release tablets What is this medicine? DIVALPROEX SODIUM (dye VAL pro ex SO dee um) is used to prevent seizures caused by some forms of epilepsy. It is also used to treat bipolar mania and to prevent migraine headaches. This medicine may be used for other purposes; ask your health care provider or pharmacist if you have questions. COMMON BRAND NAME(S): Depakote, Depakote ER What should I tell my health care provider before I take this medicine? They need to know if you have any of these conditions: -if you often drink alcohol -kidney disease -liver disease -low platelet counts -mitochondrial disease -suicidal thoughts, plans, or attempt; a previous suicide attempt by you or a family member -urea cycle disorder (UCD) -an unusual or allergic reaction to divalproex sodium, sodium valproate, valproic acid, other medicines, foods, dyes, or preservatives -pregnant or trying to get pregnant -breast-feeding How should I use this medicine? Take this medicine by mouth with a drink of water. Follow the directions on the prescription label. Do not cut, crush or chew this medicine. You can take it with or without food. If it upsets your stomach, take it with food. Take your medicine at regular intervals. Do not take it more often than directed. Do not stop taking except on your doctor's advice. A special MedGuide will be given to you by the pharmacist with each prescription and refill. Be sure to read this information carefully each time. Talk to your pediatrician regarding the use of this medicine in children. While this drug may be prescribed for children as young as 10 years for selected conditions, precautions do apply. Overdosage: If you think you have taken too much of this medicine contact a poison  control center or emergency room at once. NOTE: This medicine is only for you. Do not share this medicine with others. What if I miss a dose? If you miss a dose, take it as soon as you can. If it is almost time for your next dose, take only that dose. Do not take double or extra doses. What may interact with this medicine? Do not take this medicine with any of the following medications: -sodium phenylbutyrate This medicine may also interact with the following medications: -aspirin -certain antibiotics like ertapenem, imipenem, meropenem -certain medicines for depression, anxiety, or psychotic disturbances -certain medicines for seizures like carbamazepine, clonazepam, diazepam, ethosuximide, felbamate, lamotrigine, phenobarbital, phenytoin, primidone, rufinamide, topiramate -certain medicines that treat or prevent blood clots like warfarin -cholestyramine -male hormones, like estrogens and birth control pills, patches, or rings -propofol -rifampin -ritonavir -tolbutamide -zidovudine This list may not describe all possible interactions. Give your health care provider a list of all the medicines, herbs, non-prescription drugs, or dietary supplements you use. Also tell them if you smoke, drink alcohol, or use illegal drugs. Some items may interact with your medicine. What should I watch for while using this medicine? Tell your doctor or healthcare professional if your symptoms do not get better or they start to get worse. Wear a medical ID bracelet or chain, and carry a card that describes your disease and details of your medicine and dosage times. You may get drowsy, dizzy, or have blurred vision. Do not drive, use machinery, or do anything that needs mental alertness until you know how this medicine affects you. To reduce dizzy or fainting spells, do  not sit or stand up quickly, especially if you are an older patient. Alcohol can increase drowsiness and dizziness. Avoid alcoholic  drinks. This medicine can make you more sensitive to the sun. Keep out of the sun. If you cannot avoid being in the sun, wear protective clothing and use sunscreen. Do not use sun lamps or tanning beds/booths. Patients and their families should watch out for new or worsening depression or thoughts of suicide. Also watch out for sudden changes in feelings such as feeling anxious, agitated, panicky, irritable, hostile, aggressive, impulsive, severely restless, overly excited and hyperactive, or not being able to sleep. If this happens, especially at the beginning of treatment or after a change in dose, call your health care professional. Women should inform their doctor if they wish to become pregnant or think they might be pregnant. There is a potential for serious side effects to an unborn child. Talk to your health care professional or pharmacist for more information. Women who become pregnant while using this medicine may enroll in the [place] Antiepileptic Drug Pregnancy Registry by calling [phone]. This registry collects information about the safety of antiepileptic drug use during pregnancy. What side effects may I notice from receiving this medicine? Side effects that you should report to your doctor or health care professional as soon as possible: -allergic reactions like skin rash, itching or hives, swelling of the face, lips, or tongue -changes in vision -redness, blistering, peeling or loosening of the skin, including inside the mouth -signs and symptoms of liver injury like dark yellow or brown urine; general ill feeling or flu-like symptoms; light-colored stools; loss of appetite; nausea; right upper belly pain; unusually weak or tired; yellowing of the eyes or skin -suicidal thoughts or other mood changes -unusual bleeding or bruising Side effects that usually do not require medical attention (report to your doctor or health care professional if they continue or are  bothersome): -constipation -diarrhea -dizziness -hair loss -headache -loss of appetite -weight gain This list may not describe all possible side effects. Call your doctor for medical advice about side effects. You may report side effects to FDA at 1-800-FDA-1088. Where should I keep my medicine? Keep out of reach of children. Store at room temperature between 15 and 30 degrees C (59 and 86 degrees F). Keep container tightly closed. Throw away any unused medicine after the expiration date. NOTE: This sheet is a summary. It may not cover all possible information. If you have questions about this medicine, talk to your doctor, pharmacist, or health care provider.  2018 Elsevier/Gold Standard (2015-10-30 07:11:40)

## 2017-09-19 NOTE — Progress Notes (Signed)
[facility]    Provider:  Dr Lucia Gaskins Referring Provider: [name], MD Primary Care Physician:  [name], MD  CC:  seizures and new problem cognitive and physical decline over last year  Interval history 02/16/2018: They are on vimpat twice a day 100mg . Having side effects from Vimpat. His balance is not as good as it was. He was fatigued with the Keppra but he has a decline with vimpat, reaction time is very slow, remembering how to work a cell phone. Daughter has to use manage his pills and his he has word-finding difficulty. They are concerned about quality of life. Decline din the last 12 months since the seizures. He fell three times when on Vimpat. He is not driving. He lives by himself bit he has first alert. Daughter lives 10 minutes away. His mother had dementia she passed away at 26. He has more short-term memory loss. Discussed risks of coming off of medication, seizures can cause significant morbidity and mortality. They still decline any more medications at this time. He has difficulty remembering names and words. We can take him off of the medication and then review again. Denies depression. Will decrease medication and then have him come back in 8 weeks for dementia evaluation. No appetite.   Reviewed CT scan of the head and agree with the following: FINDINGS: Brain: There is generalized age related parenchymal atrophy with commensurate dilatation of the ventricles and sulci. Chronic small vessel ischemic changes noted within the periventricular and subcortical white matter regions bilaterally. Additional small old lacunar infarcts noted within each basal ganglia region.  There is no mass, hemorrhage, edema or other evidence of acute parenchymal abnormality. No extra-axial hemorrhage.  Vascular: There are chronic calcified atherosclerotic changes of the large vessels at the skull base. No unexpected hyperdense vessel.  Skull: Normal. Negative for  fracture or focal lesion.  Sinuses/Orbits: No acute finding.  Other: None.  IMPRESSION: 1. No acute findings.  No intracranial mass, hemorrhage or edema. 2. Atrophy and chronic ischemic changes as detailed above.  Cbc with anemia, Cmp with slightly dec GFR but largely unremarkable   HPI:  Nicholas Gaines is a 84 y.o. male here as a referral from Dr. Azucena Cecil for TIA vs seizures. Past medical history of atrial fibrillation, heart murmur, stroke in 2002, esophageal stricture, hyperlipidemia, TIA, prediabetic. He is on aspirin 325 and Plavix 75. Patient has had several episodes of staring, confusion, disorientation and unintelligible speech. Usually back to baseline within a half hour. No distinct recollection of the events and only slightly before and after them. No weakness or numbness or incoordination or gait disturbances. No headaches. Episodes happened when he was feeling well but also in the setting of fevers and cough and respiratory infection. The first episode was in November, he was fishing and he tried to talk and couldn't get his words out, lasted 15 minutes, he could talk when he got home. Daughter is here and provides much information, she saw him later that evening and it was fine. He denies any other associated symptoms such as weakness or confusion or vision changes. His friend in the car noticed that there was something wrong, no LOC, no urination or abnormal movements, he was in his usual state of health at the time no new medications or anything new but he was starting to feel ill at the time. Next episode was on Christmas eve in the setting of illness, daughter was calling him and he did not answer and he  was sitting in the car with his wet clothes on from vomiting and he was asleep in the car, he was weak, he was running a fever of 102 and he was not taken to the hospital. On Christmas day he didn't recognize family, he was speaking in "word salad" and he was aimlessly wandering  around the house and looking into space and not saying anything, couldn;t tell his daughter's name. No events since being discharged from the hospital. No current focal neurologic deficits, other associated symptoms or modifiable factors. He has not had any more incidents since starting AEDs, was started on trileptal in the hospital.   Reviewed notes, labs and imaging from outside physicians, which showed:   LDL 69, BUN 19, creatinine 1.1, A1c 5.6 08/03/2016. TSH 1.9 11/14/2015.  Patient presented to the emergency room on 07/03/2016 with fever, cough and episodic confusion. He said 2 episodes in the last several days where he had become discretely confused. Patient had a TIA in November diagnosed by primary care. He had been having on and off fevers, shortness of breath, cough and was recently started on antibiotics. Patient was unable to speak during his TIA, he remembered trying to speak and not being able to. He had a outpatient workup for TIA with his PCP that showed MRI with old stroke in 2002 and is getting loose recorder for paroxysmal A. fib. Episodes of confusion were described as disorientation and staring with associated difficulty speaking. Episodes lasted 30-60 minutes then resolved spontaneously. Patient did not have any weakness, numbness, vision or hearing loss. Episodes of happened in the setting of temperature and cough but also happen when patient was feeling well. On admission he was found to have elevated white blood cells 13, CT of the head was negative for acute intracranial abnormalities and neurology was consulted. Neurology felt that these were suggestive of complex partial seizures and not TIAs. Routine EEG was recommended for outpatient 72 hour ambulatory EEG.  Personally reviewed images and agree with the following: MRI brain 06/22/2016: 1. No acute intracranial abnormality. 2. Mild chronic microvascular ischemic changes and parenchymal volume loss for age. Few scattered  punctate foci of chronic microhemorrhage are nonspecific but also likely related to microvascular ischemic changes.  Review of Systems: Patient complains of symptoms per HPI as well as the following symptoms: Fatigue, cough, confusion, seizure. Pertinent negatives per HPI. All others negative.    Social History   Socioeconomic History  . Marital status: Widowed    Spouse name: Not on file  . Number of children: 1  . Years of education: 5212  . Highest education level: Not on file  Social Needs  . Financial resource strain: Not on file  . Food insecurity - worry: Not on file  . Food insecurity - inability: Not on file  . Transportation needs - medical: Not on file  . Transportation needs - non-medical: Not on file  Occupational History  . Occupation: Retired  Tobacco Use  . Smoking status: Never Smoker  . Smokeless tobacco: Never Used  Substance and Sexual Activity  . Alcohol use: No    Alcohol/week: 0.0 oz  . Drug use: No  . Sexual activity: Not on file  Other Topics Concern  . Not on file  Social History Narrative   04/19/17 Lives alone   Caffeine use: none   Right handed    Family History  Problem Relation Age of Onset  . Hypertension Mother   . Parkinson's disease Mother   . Heart  attack Mother   . Stroke Sister   . Lymphoma Sister   . Stroke Brother   . CVA Father   . Stroke Paternal Grandmother        in her early 61's  . Stroke Paternal Grandfather        in his early 39's  . Diabetes Neg Hx     Past Medical History:  Diagnosis Date  . A-fib (HCC)   . Arthritis   . GERD (gastroesophageal reflux disease)   . Heart murmur   . HLD (hyperlipidemia)   . Seizures (HCC)    last sz 04/17/17  . Stroke [facility])    tia    Past Surgical History:  Procedure Laterality Date  . CARPAL TUNNEL RELEASE     About 10 yr ago (08/13/16)  . LOOP RECORDER INSERTION N/A 09/16/2016   Procedure: Loop Recorder Insertion;  Surgeon: Hillis Range, MD;  Location: [facility] INVASIVE CV  LAB;  Service: Cardiovascular;  Laterality: N/A;  . TONSILLECTOMY      Current Outpatient Medications  Medication Sig Dispense Refill  . acetaminophen (TYLENOL) 500 MG tablet Take 1,000 mg by mouth every 6 (six) hours as needed.    Marland Kitchen aspirin EC 325 MG tablet Take 325 mg by mouth daily.    . Cholecalciferol 1000 units capsule Take 4,000 Units by mouth daily.     . clopidogrel (PLAVIX) 75 MG tablet Take 75 mg by mouth daily.   0  . ferrous sulfate 325 (65 FE) MG EC tablet Take 325 mg by mouth daily with breakfast.    . ibuprofen (ADVIL,MOTRIN) 200 MG tablet Take 600 mg by mouth daily as needed for headache or moderate pain.     Marland Kitchen LYCOPENE PO Take 10 mg by mouth daily.     . Multiple Vitamins-Minerals (ICAPS) CAPS Take 1 capsule by mouth daily.    . Omega-3 Fatty Acids (FISH OIL) 1000 MG CAPS Take 2,000 mg by mouth daily.    . ranitidine (ZANTAC) 150 MG tablet Take 300 mg by mouth daily.   0  . Saw Palmetto 450 MG CAPS Take 900 mg by mouth daily.     . vitamin B-12 (CYANOCOBALAMIN) 1000 MCG tablet Take 1,000 mcg by mouth daily.    Marland Kitchen ZETIA 10 MG tablet Take 10 mg by mouth daily.   0  . divalproex (DEPAKOTE) 500 MG DR tablet Take 1 tablet (500 mg total) by mouth 2 (two) times daily. 60 tablet 11   No current facility-administered medications for this visit.     Allergies as of 09/19/2017 - Review Complete 09/19/2017  Allergen Reaction Noted  . Rocephin [ceftriaxone sodium in dextrose] Other (See Comments) 08/02/2016  . Atorvastatin  08/11/2016  . Pravastatin  08/11/2016  . Rosuvastatin calcium  08/11/2016    Vitals: BP 137/80 (BP Location: Right Arm, Patient Position: Sitting)   Pulse 82   Ht 5' 5.5" (1.664 m)   Wt 157 lb (71.2 kg)   BMI 25.73 kg/m  Last Weight:  Wt Readings from Last 1 Encounters:  09/19/17 157 lb (71.2 kg)   Last Height:   Ht Readings from Last 1 Encounters:  09/19/17 5' 5.5" (1.664 m)         Assessment/Plan:   Physical exam: Exam: Gen: NAD,  conversant, well nourised, obese, well groomed                     Eyes: Conjunctivae clear without exudates or hemorrhage  Neuro: Detailed  Neurologic Exam  Speech:    Speech is normal; fluent and spontaneous with normal comprehension.  Cognition:    The patient is oriented to person, place, and time;     recent memory impaired and remote memory intact;     language fluent;     normal attention, concentration,     fund of knowledge impaired Cranial Nerves:    The pupils are equal, round, and reactive to light. Attempted fundoscopic exam could not visualize. Visual fields are full to finger confrontation. Extraocular movements are intact. Trigeminal sensation is intact and the muscles of mastication are normal. The face is symmetric. The palate elevates in the midline. Hearing intact. Voice is normal. Shoulder shrug is normal. The tongue has normal motion without fasciculations.   Coordination:    Normal finger to nose and heel to shin.   Gait:    Slight shuffling and stooped posture, imbalance, decreased arm swing  Motor Observation:    No asymmetry, no atrophy, and no involuntary movements noted. Tone:    Normal muscle tone.      Strength:    Strength is V/V in the upper and lower limbs.      Sensation: intact to LT     Reflex Exam:  DTR's:    Deep tendon reflexes in the upper and lower extremities are brisk bilaterally.   Toes:    The toes are downgoing bilaterally.   Clonus:    Clonus is absent.       Assessment/Plan:  84 y.o. male here as a follow up for seizures. In the last year there has been a considerable decline, especially since starting the Vimpat. Daughter here and provides most information. Keppra makes him very tired. Will titrate off of the Keppra and Vimpat and start Depakote.   - Discussed medication management in detail, options, side effects, decided to discontinue Keppra and Vimpat and start Depakote - Decline over the last year,  unclear if medication related but he has hx of dementia in his mother. At next appointment if symptoms don;t improve with changes in AEDS, need workup for other causes of his decline, imbalance, memory changes, fatigue. - Denies depression or mood problems - Daughter will spend an evening at home with patient and see if there are any signs of sleep apnea - Patient is unable to drive, operate heavy machinery, perform activities at heights or participate in water activities alone. - Discussed seizure precautions and risk of breakthrough seizure while adjusting medications - Had  loop recorder placement and TEE - follow up fairly soon in 8-10 weeks to monitor closely  Cc:  Sissy Hoff, MD   Naomie Dean, MD  [facility] [address] [place], [place] [address]  Phone [phone] Fax [phone]  A total of 30 minutes was spent face-to-face with this patient. Over half this time was spent on counseling patient on the seizure, cognitive decline diagnosis and different diagnostic and therapeutic options available.

## 2017-09-20 DIAGNOSIS — M25531 Pain in right wrist: Secondary | ICD-10-CM | POA: Diagnosis not present

## 2017-09-20 DIAGNOSIS — S60211A Contusion of right wrist, initial encounter: Secondary | ICD-10-CM | POA: Diagnosis not present

## 2017-09-26 DIAGNOSIS — K219 Gastro-esophageal reflux disease without esophagitis: Secondary | ICD-10-CM | POA: Diagnosis not present

## 2017-09-26 DIAGNOSIS — E782 Mixed hyperlipidemia: Secondary | ICD-10-CM | POA: Diagnosis not present

## 2017-09-26 DIAGNOSIS — R7303 Prediabetes: Secondary | ICD-10-CM | POA: Diagnosis not present

## 2017-09-26 DIAGNOSIS — I6930 Unspecified sequelae of cerebral infarction: Secondary | ICD-10-CM | POA: Diagnosis not present

## 2017-09-29 NOTE — Telephone Encounter (Signed)
Note pt has been r/s.

## 2017-10-04 LAB — CUP PACEART REMOTE DEVICE CHECK
Date Time Interrogation Session: 20190203204023
Implantable Pulse Generator Implant Date: 20180208

## 2017-10-06 DIAGNOSIS — M25531 Pain in right wrist: Secondary | ICD-10-CM | POA: Diagnosis not present

## 2017-10-11 ENCOUNTER — Encounter: Payer: Self-pay | Admitting: Neurology

## 2017-10-13 NOTE — Telephone Encounter (Signed)
Spoke with Elita QuickPam (on DPR). She stated that the patient has had no other symptoms. He did well on the Depakote for the first week and then the second week he started having GI issues. Some days he stomach does not bother him much, other times he cannot make it to the bathroom. He has not had any seizures. Denies any other major side effects. His diet has not recently changed. She is open to waiting another week and see how he does or should the patient add Citrucel, Probiotics or other OTC remedy as she hates to change it if they do not have to.  She would like to know what Dr. Lucia GaskinsAhern says.   [name] stated that if she does not answer the call back, it is ok to leave details in the message.

## 2017-10-14 ENCOUNTER — Ambulatory Visit (INDEPENDENT_AMBULATORY_CARE_PROVIDER_SITE_OTHER): Payer: Medicare Other | Admitting: *Deleted

## 2017-10-14 DIAGNOSIS — R002 Palpitations: Secondary | ICD-10-CM

## 2017-10-17 ENCOUNTER — Telehealth: Payer: Self-pay | Admitting: Neurology

## 2017-10-17 NOTE — Progress Notes (Signed)
Carelink Summary Report / Loop Recorder 

## 2017-10-17 NOTE — Telephone Encounter (Signed)
Pt's daughter called. He is still having loose bowels. She gave him metamucil around lunch today. He has also taken one imodium early this morning. She is aware RN sent a msg to provider and is waiting for a reply. Please call to advise

## 2017-10-18 MED ORDER — ZONISAMIDE 100 MG PO CAPS: 100.0000 mg | ORAL_CAPSULE | Freq: Every day | ORAL | 5 refills | Status: DC

## 2017-10-18 NOTE — Addendum Note (Signed)
Addended by: Bertram SavinULBERTSON, BETHANY L on: 10/18/2017 04:29 PM   Modules accepted: Orders

## 2017-10-18 NOTE — Telephone Encounter (Addendum)
Per Dr. Lucia GaskinsAhern, suggest for pt to stop taking the Depakote now d/t concern for dehydration. There is risk of break-thru seizure, however pt can start taking Zonisamide 100 mg PO qhs and can start tonight if they can get it. Potential side effects can include the following: Somnolence, Dizziness, Nausea, H/A, Irritability, Fatigue. If daughter agrees, please order.   Spoke with the patient's daughter Rinaldo Cloudamela. Discussed Dr. Trevor MaceAhern's advisement to stop taking the Depakote now. Discussed that there is a risk for break thru seizure, however pt can start on Zonisamide 100 mg PO qhs. The patient's daughter verbalized and asked about potential side effects. RN discussed that they can include Somnolence, Dizziness, Nausea, H/A, Irritability, Fatigue. Rinaldo Cloudamela verbalized understanding and agreed to start the medication. RN placed the order. Pt can start medication tonight however the daughter stated she would not be able to pick it up tonight. RN encouraged her to call back with any further concerns/questions. She verbalized understanding.   E-scribed Zonisamide prescription to pt's pharmacy.

## 2017-10-18 NOTE — Telephone Encounter (Signed)
I would stop the Depakote for a few days

## 2017-10-18 NOTE — Telephone Encounter (Signed)
Spoke with the patient's daughter once more to discuss having someone stay with the patient. The pt does live alone but he does have first alert and the daughter lives closeby. She stated that she actually is not even sure if she is going to change his medication since he has been on so many in the past and she is unfamiliar with the Zonisamide. She is curious if decreasing the Depakote to 250 mg BID would potentially improve the diarrhea. She started him on fiber liquid yesterday and she stated that the patient is taking oral liquids fine. He has only been to the bathroom once today for diarrhea. She stated she almost would rather put him back on Keppra as he did have 95% seizure control and only s/e was sleepiness which he is sleepy anyway. She will continue the Depakote for now and wait for Dr. Trevor MaceAhern's reply. RN informed her that she will d/w Dr. Lucia GaskinsAhern and call the daughter back. She verbalized appreciation.

## 2017-10-19 ENCOUNTER — Other Ambulatory Visit: Payer: Self-pay | Admitting: Neurology

## 2017-10-19 MED ORDER — DIVALPROEX SODIUM ER 500 MG PO TB24: 500.0000 mg | ORAL_TABLET | Freq: Every day | ORAL | 6 refills | Status: DC

## 2017-10-19 NOTE — Telephone Encounter (Signed)
Nicholas Gaines returned the call and would like to try the Depakote 500 mg extended release once daily. Since the medication does make him sleepy, RN suggested to give it at night. She verbalized agreement. Nicholas Gaines stated that she spoke with the pt this morning- he was able to have a normal BM this morning. She has added metamucil fiber to his daily routine and she feels that is helping. Nicholas Gaines will continue to keep us updated and if at some point she feels the patient needs to go back to BID dosing, she will talk to Dr. Lucia GaskinsAhern first.   Dr. Lucia GaskinsAhern aware of the above conversation.

## 2017-10-19 NOTE — Telephone Encounter (Signed)
Nicholas Edwardsalled Pamela, pt's daughter and LVM (Nicholas per previous discussion w/ her) informing her that Dr. Lucia GaskinsAhern has said that the patient can try Depakote 500 mg extended release once daily instead of twice. RN asked for call back to let us know what she thinks. Left office number in message.

## 2017-10-19 NOTE — Telephone Encounter (Signed)
We can try Depakote 500mg  extended release once daily, so it is a lower dose and just once daily.

## 2017-11-16 ENCOUNTER — Ambulatory Visit: Payer: Medicare Other | Admitting: Neurology

## 2017-11-16 ENCOUNTER — Encounter: Payer: Self-pay | Admitting: Neurology

## 2017-11-16 ENCOUNTER — Ambulatory Visit (INDEPENDENT_AMBULATORY_CARE_PROVIDER_SITE_OTHER): Payer: Medicare Other | Admitting: *Deleted

## 2017-11-16 ENCOUNTER — Encounter: Payer: Self-pay | Admitting: Psychology

## 2017-11-16 VITALS — BP 147/80 | HR 74 | Ht 65.5 in | Wt 159.8 lb

## 2017-11-16 DIAGNOSIS — R413 Other amnesia: Secondary | ICD-10-CM

## 2017-11-16 DIAGNOSIS — R002 Palpitations: Secondary | ICD-10-CM

## 2017-11-16 DIAGNOSIS — G3281 Cerebellar ataxia in diseases classified elsewhere: Secondary | ICD-10-CM | POA: Diagnosis not present

## 2017-11-16 DIAGNOSIS — R270 Ataxia, unspecified: Secondary | ICD-10-CM

## 2017-11-16 MED ORDER — DIVALPROEX SODIUM ER 250 MG PO TB24: 250.0000 mg | ORAL_TABLET | Freq: Every day | ORAL | 11 refills | Status: DC

## 2017-11-16 NOTE — Patient Instructions (Addendum)
Increase Depakote to 750mg  at bedtime (500mg  + 250mg ) MRI brain and neck Neurocognitive testing with Dr. Alinda Gaines F/u in 6 months Labs today

## 2017-11-16 NOTE — Progress Notes (Signed)
[facility]    Provider:  Dr Lucia Gaines Referring Provider: [name], MD Primary Care Physician:  Nicholas Joe, MD  CC:  seizures and new problem cognitive and physical decline over last year  Interval history 11/16/2017:  Patient had diarrhea to Depakote and he was changed to 500mg  ER daily.  He is here with his daughter. Diarrhea went away. Doing well on the extended release. He has had 2 breakthrough seizures. He has been having more good days. Some days he feels like running, he is working in the garden. His speech is a lot better. Has had 2 breakthrough seizures so will increase to 750mg  day. He feels his walking is better. No falls. He has been to physical therapy or balance. He has arthritis in his hips and he drags his feet. No neck pain, he has disk in his back that gives him problems. But he still has imbalance. He still feels he has problems with memory. Daughter provides information. Daughter says he is doing better.   Interval history 02/16/2018: They are on vimpat twice a day 100mg . Having side effects from Vimpat. His balance is not as good as it was. He was fatigued with the Keppra but he has a decline with vimpat, reaction time is very slow, remembering how to work a cell phone. Daughter has to use manage his pills and his he has word-finding difficulty. They are concerned about quality of life. Decline din the last 12 months since the seizures. He fell three times when on Vimpat. He is not driving. He lives by himself bit he has first alert. Daughter lives 10 minutes away. His mother had dementia she passed away at 8. He has more short-term memory loss. Discussed risks of coming off of medication, seizures can cause significant morbidity and mortality. They still decline any more medications at this time. He has difficulty remembering names and words. We can take him off of the medication and then review again. Denies depression. Will decrease medication and then  have him come back in 8 weeks for dementia evaluation. No appetite.   Reviewed CT scan of the head and agree with the following: FINDINGS: Brain: There is generalized age related parenchymal atrophy with commensurate dilatation of the ventricles and sulci. Chronic small vessel ischemic changes noted within the periventricular and subcortical white matter regions bilaterally. Additional small old lacunar infarcts noted within each basal ganglia region.  There is no mass, hemorrhage, edema or other evidence of acute parenchymal abnormality. No extra-axial hemorrhage.  Vascular: There are chronic calcified atherosclerotic changes of the large vessels at the skull base. No unexpected hyperdense vessel.  Skull: Normal. Negative for fracture or focal lesion.  Sinuses/Orbits: No acute finding.  Other: None.  IMPRESSION: 1. No acute findings.  No intracranial mass, hemorrhage or edema. 2. Atrophy and chronic ischemic changes as detailed above.  Cbc with anemia, Cmp with slightly dec GFR but largely unremarkable   HPI:  Nicholas Gaines is a 84 y.o. male here as a referral from Nicholas. Azucena Gaines for TIA vs seizures. Past medical history of atrial fibrillation, heart murmur, stroke in 2002, esophageal stricture, hyperlipidemia, TIA, prediabetic. He is on aspirin 325 and Plavix 75. Patient has had several episodes of staring, confusion, disorientation and unintelligible speech. Usually back to baseline within a half hour. No distinct recollection of the events and only slightly before and after them. No weakness or numbness or incoordination or gait disturbances. No headaches. Episodes happened when he was feeling well but  also in the setting of fevers and cough and respiratory infection. The first episode was in November, he was fishing and he tried to talk and couldn't get his words out, lasted 15 minutes, he could talk when he got home. Daughter is here and provides much information, she saw him later  that evening and it was fine. He denies any other associated symptoms such as weakness or confusion or vision changes. His friend in the car noticed that there was something wrong, no LOC, no urination or abnormal movements, he was in his usual state of health at the time no new medications or anything new but he was starting to feel ill at the time. Next episode was on Christmas eve in the setting of illness, daughter was calling him and he did not answer and he was sitting in the car with his wet clothes on from vomiting and he was asleep in the car, he was weak, he was running a fever of 102 and he was not taken to the hospital. On Christmas day he didn't recognize family, he was speaking in "word salad" and he was aimlessly wandering around the house and looking into space and not saying anything, couldn;t tell his daughter's name. No events since being discharged from the hospital. No current focal neurologic deficits, other associated symptoms or modifiable factors. He has not had any more incidents since starting AEDs, was started on trileptal in the hospital.   Reviewed notes, labs and imaging from outside physicians, which showed:   LDL 69, BUN 19, creatinine 1.1, A1c 5.6 08/03/2016. TSH 1.9 11/14/2015.  Patient presented to the emergency room on 07/03/2016 with fever, cough and episodic confusion. He said 2 episodes in the last several days where he had become discretely confused. Patient had a TIA in November diagnosed by primary care. He had been having on and off fevers, shortness of breath, cough and was recently started on antibiotics. Patient was unable to speak during his TIA, he remembered trying to speak and not being able to. He had a outpatient workup for TIA with his PCP that showed MRI with old stroke in 2002 and is getting loose recorder for paroxysmal A. fib. Episodes of confusion were described as disorientation and staring with associated difficulty speaking. Episodes lasted 30-60  minutes then resolved spontaneously. Patient did not have any weakness, numbness, vision or hearing loss. Episodes of happened in the setting of temperature and cough but also happen when patient was feeling well. On admission he was found to have elevated white blood cells 13, CT of the head was negative for acute intracranial abnormalities and neurology was consulted. Neurology felt that these were suggestive of complex partial seizures and not TIAs. Routine EEG was recommended for outpatient 72 hour ambulatory EEG.  Personally reviewed images and agree with the following: MRI brain 06/22/2016: 1. No acute intracranial abnormality. 2. Mild chronic microvascular ischemic changes and parenchymal volume loss for age. Few scattered punctate foci of chronic microhemorrhage are nonspecific but also likely related to microvascular ischemic changes.  Review of Systems: Patient complains of symptoms per HPI as well as the following symptoms: Fatigue, cough, confusion, seizure. Pertinent negatives per HPI. All others negative.    Social History   Socioeconomic History  . Marital status: Widowed    Spouse name: Not on file  . Number of children: 1  . Years of education: 65  . Highest education level: Not on file  Occupational History  . Occupation: Retired  Engineer, production  .  Financial resource strain: Not on file  . Food insecurity:    Worry: Not on file    Inability: Not on file  . Transportation needs:    Medical: Not on file    Non-medical: Not on file  Tobacco Use  . Smoking status: Never Smoker  . Smokeless tobacco: Never Used  Substance and Sexual Activity  . Alcohol use: No    Alcohol/week: 0.0 oz  . Drug use: No  . Sexual activity: Not on file  Lifestyle  . Physical activity:    Days per week: Not on file    Minutes per session: Not on file  . Stress: Not on file  Relationships  . Social connections:    Talks on phone: Not on file    Gets together: Not on file    Attends  religious service: Not on file    Active member of club or organization: Not on file    Attends meetings of clubs or organizations: Not on file    Relationship status: Not on file  . Intimate partner violence:    Fear of current or ex partner: Not on file    Emotionally abused: Not on file    Physically abused: Not on file    Forced sexual activity: Not on file  Other Topics Concern  . Not on file  Social History Narrative   04/19/17 Lives alone   Caffeine use: none   Right handed    Family History  Problem Relation Age of Onset  . Hypertension Mother   . Parkinson's disease Mother   . Heart attack Mother   . Stroke Sister   . Lymphoma Sister   . Stroke Brother   . CVA Father   . Stroke Paternal Grandmother        in her early 60's  . Stroke Paternal Grandfather        in his early 17's  . Diabetes Neg Hx     Past Medical History:  Diagnosis Date  . A-fib (HCC)   . Arthritis   . GERD (gastroesophageal reflux disease)   . Heart murmur   . HLD (hyperlipidemia)   . Seizures (HCC)    last sz 04/17/17  . Stroke [facility])    tia    Past Surgical History:  Procedure Laterality Date  . CARPAL TUNNEL RELEASE     About 10 yr ago (08/13/16)  . LOOP RECORDER INSERTION N/A 09/16/2016   Procedure: Loop Recorder Insertion;  Surgeon: Hillis Range, MD;  Location: [facility] INVASIVE CV LAB;  Service: Cardiovascular;  Laterality: N/A;  . TONSILLECTOMY      Current Outpatient Medications  Medication Sig Dispense Refill  . acetaminophen (TYLENOL) 500 MG tablet Take 1,000 mg by mouth every 6 (six) hours as needed.    Marland Kitchen aspirin EC 325 MG tablet Take 325 mg by mouth daily.    . Cholecalciferol 1000 units capsule Take 4,000 Units by mouth daily.     . clopidogrel (PLAVIX) 75 MG tablet Take 75 mg by mouth daily.   0  . divalproex (DEPAKOTE ER) 500 MG 24 hr tablet Take 1 tablet (500 mg total) by mouth daily. 30 tablet 6  . ferrous sulfate 325 (65 FE) MG EC tablet Take 325 mg by mouth daily with  breakfast.    . ibuprofen (ADVIL,MOTRIN) 200 MG tablet Take 600 mg by mouth daily as needed for headache or moderate pain.     Marland Kitchen LYCOPENE PO Take 10 mg by mouth daily.     Marland Kitchen  Multiple Vitamins-Minerals (ICAPS) CAPS Take 1 capsule by mouth daily.    . Omega-3 Fatty Acids (FISH OIL) 1000 MG CAPS Take 2,000 mg by mouth daily.    Marland Kitchen. OVER THE COUNTER MEDICATION CBD Oil daily    . ranitidine (ZANTAC) 150 MG tablet Take 300 mg by mouth daily.   0  . Saw Palmetto 450 MG CAPS Take 900 mg by mouth daily.     . vitamin B-12 (CYANOCOBALAMIN) 1000 MCG tablet Take 1,000 mcg by mouth daily.    Marland Kitchen. ZETIA 10 MG tablet Take 10 mg by mouth daily.   0  . divalproex (DEPAKOTE ER) 250 MG 24 hr tablet Take 1 tablet (250 mg total) by mouth daily. Take with your 500mg  pill for a total of 750mg  daily 30 tablet 11   No current facility-administered medications for this visit.     Allergies as of 11/16/2017 - Review Complete 11/16/2017  Allergen Reaction Noted  . Rocephin [ceftriaxone sodium in dextrose] Other (See Comments) 08/02/2016  . Atorvastatin  08/11/2016  . Pravastatin  08/11/2016  . Rosuvastatin calcium  08/11/2016    Vitals: BP (!) 147/80   Pulse 74   Ht 5' 5.5" (1.664 m)   Wt 159 lb 12.8 oz (72.5 kg)   BMI 26.19 kg/m  Last Weight:  Wt Readings from Last 1 Encounters:  11/16/17 159 lb 12.8 oz (72.5 kg)   Last Height:   Ht Readings from Last 1 Encounters:  11/16/17 5' 5.5" (1.664 m)         Assessment/Plan:   Physical exam: Exam: Gen: NAD, conversant, well nourised, obese, well groomed                     Eyes: Conjunctivae clear without exudates or hemorrhage    Neuro: Detailed Neurologic Exam  Speech:    Speech is normal; fluent and spontaneous with normal comprehension.  Cognition:     MMSE - Mini Mental State Exam 11/16/2017  Orientation to time 5  Orientation to Place 3  Registration 3  Attention/ Calculation 3  Recall 2  Language- name 2 objects 2  Language- repeat 1   Language- follow 3 step command 3  Language- read & follow direction 1  Write a sentence 1  Copy design 0  Total score 24     Cranial Nerves:    The pupils are equal, round, and reactive to light. Attempted fundoscopic exam could not visualize. Visual fields are full to finger confrontation. Extraocular movements are intact. Trigeminal sensation is intact and the muscles of mastication are normal. The face is symmetric. The palate elevates in the midline. Hearing intact. Voice is normal. Shoulder shrug is normal. The tongue has normal motion without fasciculations.   Coordination:    Normal finger to nose and heel to shin.   Gait:    Slight shuffling and stooped posture, imbalance, decreased arm swing  Motor Observation:    No asymmetry, no atrophy, and no involuntary movements noted. Tone:    Normal muscle tone.      Strength:    Strength is V/V in the upper and lower limbs.      Sensation: intact to LT     Reflex Exam:  DTR's:    Deep tendon reflexes in the upper and lower extremities are brisk bilaterally.   Toes:    The toes are downgoing bilaterally.   Clonus:    Clonus is absent.       Assessment/Plan:  84  y.o. male here as a follow up for seizures. In the last year there has been a considerable decline, especially since starting the Vimpat. Daughter here and provides most information. Keppra makes him very tired. Titrated off of the Keppra and Vimpat and doing well on Depakote ER (did not tolerate Depakote IR).   - Increase Depakote for breakthrough seizures - Decline over the last year, unclear if medication related but he has hx of dementia in his mother, episodes of aphasia. He feel better on the Depakote but questionable normal cognitive aging vs early dementia. Suggested MRI brain and labs. Formal neurocognitive testing.   - Ataxia: MRI cervical spine to evaluate for myelopathy or other etiology - No snoring, daughter spent the night - Denies  depression or mood problems - Patient is unable to drive, operate heavy machinery, perform activities at heights or participate in water activities alone. - Discussed seizure precautions and risk of breakthrough seizure while adjusting medications - Had  loop recorder placement and TEE - follow up fairly soon in 3-6 months to monitor closely  Cc:  Sissy Hoff, MD  Orders Placed This Encounter  Procedures  . MR BRAIN WO CONTRAST  . MR CERVICAL SPINE WO CONTRAST  . TSH  . B12 and Folate Panel  . Methylmalonic acid, serum  . Vitamin B1  . Vitamin B6  . Homocysteine  . Valproic acid level  . Comprehensive metabolic panel  . CBC  . Ambulatory referral to Neuropsychology       Naomie Dean, MD  [facility] [address] [place], [place] [address]  Phone [phone] Fax [phone]  A total of 40 minutes was spent face-to-face with this patient. Over half this time was spent on counseling patient on the seizure, cognitive decline diagnosis and different diagnostic and therapeutic options available.

## 2017-11-17 NOTE — Progress Notes (Signed)
Carelink Summary Report / Loop Recorder 

## 2017-11-20 LAB — COMPREHENSIVE METABOLIC PANEL
ALBUMIN: 3.9 g/dL (ref 3.5–4.7)
ALK PHOS: 130 IU/L — AB (ref 39–117)
ALT: 11 IU/L (ref 0–44)
AST: 15 IU/L (ref 0–40)
Albumin/Globulin Ratio: 1.7 (ref 1.2–2.2)
BUN / CREAT RATIO: 14 (ref 10–24)
BUN: 14 mg/dL (ref 8–27)
Bilirubin Total: 0.2 mg/dL (ref 0.0–1.2)
CO2: 26 mmol/L (ref 20–29)
CREATININE: 0.97 mg/dL (ref 0.76–1.27)
Calcium: 9 mg/dL (ref 8.6–10.2)
Chloride: 102 mmol/L (ref 96–106)
GFR calc Af Amer: 81 mL/min/{1.73_m2} (ref >59)
GFR calc non Af Amer: 70 mL/min/{1.73_m2} (ref >59)
GLUCOSE: 117 mg/dL — AB (ref 65–99)
Globulin, Total: 2.3 g/dL (ref 1.5–4.5)
Potassium: 4.6 mmol/L (ref 3.5–5.2)
SODIUM: 142 mmol/L (ref 134–144)
Total Protein: 6.2 g/dL (ref 6.0–8.5)

## 2017-11-20 LAB — VALPROIC ACID LEVEL: Valproic Acid Lvl: 43 ug/mL — ABNORMAL LOW (ref 50–100)

## 2017-11-20 LAB — VITAMIN B1: Thiamine: 94 nmol/L (ref 66.5–200.0)

## 2017-11-20 LAB — TSH: TSH: 3.93 u[IU]/mL (ref 0.450–4.500)

## 2017-11-20 LAB — METHYLMALONIC ACID, SERUM: Methylmalonic Acid: 140 nmol/L (ref 0–378)

## 2017-11-20 LAB — CBC
Hematocrit: 35.3 % — ABNORMAL LOW (ref 37.5–51.0)
Hemoglobin: 11.5 g/dL — ABNORMAL LOW (ref 13.0–17.7)
MCH: 31.1 pg (ref 26.6–33.0)
MCHC: 32.6 g/dL (ref 31.5–35.7)
MCV: 95 fL (ref 79–97)
PLATELETS: 161 10*3/uL (ref 150–379)
RBC: 3.7 x10E6/uL — AB (ref 4.14–5.80)
RDW: 14.1 % (ref 12.3–15.4)
WBC: 7.1 10*3/uL (ref 3.4–10.8)

## 2017-11-20 LAB — B12 AND FOLATE PANEL
FOLATE: 14.8 ng/mL (ref >3.0)
Vitamin B-12: >2000 pg/mL — ABNORMAL HIGH (ref 232–1245)

## 2017-11-20 LAB — HOMOCYSTEINE: HOMOCYSTEINE: 10.2 umol/L (ref 0.0–15.0)

## 2017-11-20 LAB — VITAMIN B6: VITAMIN B6: 2.3 ug/L — AB (ref 5.3–46.7)

## 2017-11-21 ENCOUNTER — Telehealth: Payer: Self-pay | Admitting: Neurology

## 2017-11-21 NOTE — Telephone Encounter (Signed)
[employer] Berkley Harveyauth: [id number] (exp. 11/21/17 to 12/20/17) order sent to [facility]. They will reacho ut to the pt to schedule.

## 2017-11-24 LAB — CUP PACEART REMOTE DEVICE CHECK
Date Time Interrogation Session: 20190308210922
MDC IDC PG IMPLANT DT: 20180208

## 2017-12-08 ENCOUNTER — Ambulatory Visit
Admission: RE | Admit: 2017-12-08 | Discharge: 2017-12-08 | Disposition: A | Discharge status: Home / Self Care | Payer: Medicare Other | Source: Ambulatory Visit | Attending: Neurology | Admitting: Neurology

## 2017-12-08 DIAGNOSIS — R413 Other amnesia: Secondary | ICD-10-CM

## 2017-12-08 DIAGNOSIS — R270 Ataxia, unspecified: Secondary | ICD-10-CM

## 2017-12-08 DIAGNOSIS — G3281 Cerebellar ataxia in diseases classified elsewhere: Secondary | ICD-10-CM

## 2017-12-15 ENCOUNTER — Encounter: Payer: Self-pay | Admitting: Neurology

## 2017-12-15 ENCOUNTER — Telehealth: Payer: Self-pay | Admitting: *Deleted

## 2017-12-15 NOTE — Telephone Encounter (Signed)
Called pt's daughter Elita Quick (on [place]) and LVM asking for call back to discuss her questions. Left office number in message.

## 2017-12-15 NOTE — Telephone Encounter (Signed)
-----   Message from Anson Fret, MD sent at 12/13/2017  5:43 PM EDT ----- MRI of the brain shows atrophy . Atrophy can be seen normally in aging.However his is seen in the mesial temporal lobes that can affect cognition and is sometimes seen in dementias such as alzheimer's disease. His chronic white matter changes and his atrophy could be causing his cognitive symptoms and can sometimes affect balance. The MRI of his cervical spine showed degenerative/arthritic changes but the cervical cord looked normal so no cause in the neck seem for his imbalance. I am happy to see them in the office and review images and discuss in person.

## 2017-12-16 NOTE — Telephone Encounter (Signed)
Pt daughter(on [place]) has returned the call to [employer].  Please call back with results

## 2017-12-18 ENCOUNTER — Telehealth: Payer: Self-pay | Admitting: Neurology

## 2017-12-18 NOTE — Telephone Encounter (Signed)
Nicholas Gaines [phone]

## 2017-12-18 NOTE — Telephone Encounter (Signed)
Discussed MRI brain and cervical spine results with daghter, she understood.

## 2017-12-18 NOTE — Telephone Encounter (Signed)
Patient sent an email, called left message. Will send email and see if we can arrange a time to discuss MRI brain and cervical spine

## 2017-12-19 ENCOUNTER — Ambulatory Visit (INDEPENDENT_AMBULATORY_CARE_PROVIDER_SITE_OTHER): Payer: Medicare Other | Admitting: *Deleted

## 2017-12-19 DIAGNOSIS — R002 Palpitations: Secondary | ICD-10-CM

## 2017-12-20 NOTE — Progress Notes (Signed)
Carelink Summary Report / Loop Recorder 

## 2017-12-21 LAB — CUP PACEART REMOTE DEVICE CHECK
Implantable Pulse Generator Implant Date: 20180208
MDC IDC SESS DTM: 20190410213954

## 2018-01-11 LAB — CUP PACEART REMOTE DEVICE CHECK
Implantable Pulse Generator Implant Date: 20180208
MDC IDC SESS DTM: 20190513220719

## 2018-01-23 ENCOUNTER — Ambulatory Visit (INDEPENDENT_AMBULATORY_CARE_PROVIDER_SITE_OTHER): Payer: Medicare Other | Admitting: *Deleted

## 2018-01-23 DIAGNOSIS — R002 Palpitations: Secondary | ICD-10-CM | POA: Diagnosis not present

## 2018-01-24 NOTE — Progress Notes (Signed)
Carelink Summary Report / Loop Recorder 

## 2018-02-23 ENCOUNTER — Ambulatory Visit (INDEPENDENT_AMBULATORY_CARE_PROVIDER_SITE_OTHER): Payer: Medicare Other | Admitting: *Deleted

## 2018-02-23 DIAGNOSIS — R002 Palpitations: Secondary | ICD-10-CM

## 2018-02-24 NOTE — Progress Notes (Signed)
Carelink Summary Report / Loop Recorder 

## 2018-02-27 LAB — CUP PACEART REMOTE DEVICE CHECK
MDC IDC PG IMPLANT DT: 20180208
MDC IDC SESS DTM: 20190615220528

## 2018-03-28 ENCOUNTER — Ambulatory Visit (INDEPENDENT_AMBULATORY_CARE_PROVIDER_SITE_OTHER): Payer: Medicare Other | Admitting: *Deleted

## 2018-03-28 DIAGNOSIS — I639 Cerebral infarction, unspecified: Secondary | ICD-10-CM

## 2018-03-29 NOTE — Progress Notes (Signed)
Carelink Summary Report / Loop Recorder 

## 2018-04-12 DIAGNOSIS — H5203 Hypermetropia, bilateral: Secondary | ICD-10-CM | POA: Diagnosis not present

## 2018-04-12 DIAGNOSIS — H353131 Nonexudative age-related macular degeneration, bilateral, early dry stage: Secondary | ICD-10-CM | POA: Diagnosis not present

## 2018-04-12 DIAGNOSIS — H25099 Other age-related incipient cataract, unspecified eye: Secondary | ICD-10-CM | POA: Diagnosis not present

## 2018-04-12 DIAGNOSIS — H5213 Myopia, bilateral: Secondary | ICD-10-CM | POA: Diagnosis not present

## 2018-04-12 DIAGNOSIS — H3530 Unspecified macular degeneration: Secondary | ICD-10-CM | POA: Diagnosis not present

## 2018-04-13 LAB — CUP PACEART REMOTE DEVICE CHECK
Date Time Interrogation Session: 20190718224021
MDC IDC PG IMPLANT DT: 20180208

## 2018-04-20 ENCOUNTER — Encounter: Payer: Self-pay | Admitting: Psychology

## 2018-04-20 ENCOUNTER — Ambulatory Visit (INDEPENDENT_AMBULATORY_CARE_PROVIDER_SITE_OTHER): Payer: Medicare Other | Admitting: Psychology

## 2018-04-20 ENCOUNTER — Ambulatory Visit: Payer: Medicare Other | Admitting: Psychology

## 2018-04-20 DIAGNOSIS — R413 Other amnesia: Secondary | ICD-10-CM | POA: Diagnosis not present

## 2018-04-20 DIAGNOSIS — G40209 Localization-related (focal) (partial) symptomatic epilepsy and epileptic syndromes with complex partial seizures, not intractable, without status epilepticus: Secondary | ICD-10-CM

## 2018-04-20 NOTE — Progress Notes (Signed)
NEUROBEHAVIORAL STATUS EXAM   Name: Nicholas Gaines Date of Birth: 08/19/1928 Date of Interview: 04/20/2018  Reason for Referral:  Nicholas Gaines is a 84 y.o. male who is referred for neuropsychological evaluation by Dr. Naomie DeanAntonia Ahern of [facility] due to concerns about memory loss. This patient is accompanied in the office by his daughter, Elita Quickam, who supplements the history.  History of Presenting Problem:  Nicholas Gaines has been followed by Dr. Lucia GaskinsAhern for seizures since April 2018. Seizures started in late 2017 and were characterized by aphasia. He was initially tried on Vimpat and Keppra but had significant side effects with both of these. He has done better with Depakote ER. He does still have some breakthrough seizures but his overall functioning is better. MMSE with Dr. Lucia GaskinsAhern on 11/16/2017 was 24/30. There was a question of normal cognitive aging vs early dementia. Recent brain MRI from 12/08/2017 revealed moderate generalized cortical atrophy, more pronounced in the mesial temporal lobes, and it was noted that the extent of atrophy had progressed noticeably since the 06/22/2016 MRI. There was also evidence of moderate chronic microvascular ischemic change, slightly progressed when compared to the 2017 MRI. There were no acute findings. He has a family history of dementia in his mother, she passed away at age 82.  At today's visit (04/20/2018), the patient denies any major concerns about memory. His daughter agrees there is no significant forgetfulness for recent conversations or events, and he is not repeating questions or statements, not losing or misplacing items. He lives alone (daughter lives 10 mins away and checks on him regularly) and is able to manage basic ADLs as well as many instrumental ADLs. He stopped driving after seizure diagnosis. His daughter prepares his daily pill planner but he self-administers medications daily. He forgets doses once in a while, not very often.  His daughter manages his bills and checking account due to his difficulty writing secondary to tremor. He is able to prepare meals and do some cooking without problems. His daughter manages his appointments but he is aware of when they are and does not forget about them. He does his own housekeeping, laundry and shopping.  His daughter reports that she has noticed some word finding difficulty in daily life (separate from aphasia he experiences when having a seizure). He also may call people by the wrong name from time to time (e.g., may refer to her son with her husband's name). However, he does not appear to have receptive language deficits as comprehension (both auditory and reading) are reportedly intact.  The patient has no psychiatric history. He and his daughter report he is "extremely grounded in his faith" and his mood is very even keel. He has never struggled with depression or anxiety. Since he started having seizures and his physical functioning has declined, he has been frustrated with reduced level of activity. Prior to the seizures, he was extremely active physically for his age. He maintained a full size garden and had good physical strength. Now he has reduced energy and stamina. He can only maintain a small garden. He falls asleep very easily during the day. He has always enjoyed reading but this is harder to do now because he falls asleep when he is sitting down and comfortable. He does not fall asleep while doing any other activities (e.g., does not fall asleep mid conversation or mid physical task). He has no difficulty sleeping at night. His appetite is good. He has lost some weight but he still eats  well. He has not had any falls recently. He was having falls when on Vimpat, but never hit his head or lost consciousness. He does not drink any alcohol. He goes to church regularly and has friends that provide transportation for him (as well as his daughter).    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 Marital history: Widowed since 2001. Children: 1 daughter, 1 grandson Alcohol: None Tobacco: Never a smoker or tobacco user   Medical History: Past Medical History:  Diagnosis Date  . A-fib (HCC)   . Arthritis   . GERD (gastroesophageal reflux disease)   . Heart murmur   . HLD (hyperlipidemia)   . Seizures (HCC)    last sz 04/17/17  . Stroke [facility])    tia     Current Medications:  Outpatient Encounter Medications as of 04/20/2018  Medication Sig  . acetaminophen (TYLENOL) 500 MG tablet Take 1,000 mg by mouth every 6 (six) hours as needed.  Marland Kitchen aspirin EC 325 MG tablet Take 325 mg by mouth daily.  . Cholecalciferol 1000 units capsule Take 4,000 Units by mouth daily.   . clopidogrel (PLAVIX) 75 MG tablet Take 75 mg by mouth daily.   . divalproex (DEPAKOTE ER) 250 MG 24 hr tablet Take 1 tablet (250 mg total) by mouth daily. Take with your 500mg  pill for a total of 750mg  daily  . divalproex (DEPAKOTE ER) 500 MG 24 hr tablet Take 1 tablet (500 mg total) by mouth daily.  . ferrous sulfate 325 (65 FE) MG EC tablet Take 325 mg by mouth daily with breakfast.  . ibuprofen (ADVIL,MOTRIN) 200 MG tablet Take 600 mg by mouth daily as needed for headache or moderate pain.   Marland Kitchen LYCOPENE PO Take 10 mg by mouth daily.   . Multiple Vitamins-Minerals (ICAPS) CAPS Take 1 capsule by mouth daily.  . Omega-3 Fatty Acids (FISH OIL) 1000 MG CAPS Take 2,000 mg by mouth daily.  Marland Kitchen OVER THE COUNTER MEDICATION CBD Oil daily  . ranitidine (ZANTAC) 150 MG tablet Take 300 mg by mouth daily.   . Saw Palmetto 450 MG CAPS Take 900 mg by mouth daily.   . vitamin B-12 (CYANOCOBALAMIN) 1000 MCG tablet Take 1,000 mcg by mouth daily.  Marland Kitchen ZETIA 10 MG tablet Take 10 mg by mouth daily.    No facility-administered encounter medications on file as of 04/20/2018.      Behavioral Observations:   Appearance: Neatly  and appropriately dressed and groomed Gait: Ambulated independently, shuffling gait Speech: Fluent; mildly increased response latencies, soft volume, mildly slowed rate, normal rhythm. Minimal word finding difficulty noted during conversational speech. Thought process: Generally linear, mildly tangential at times Affect: Full, euthymic Interpersonal: Very pleasant, appropriate   40 minutes spent face-to-face with patient completing neurobehavioral status exam. 30 minutes spent integrating medical records/clinical data and completing this report. CPT code [phone] unit.   TESTING: There is medical necessity to proceed with neuropsychological assessment as the results will be used to aid in differential diagnosis and clinical decision-making and to inform specific treatment recommendations. Per the patient's daughter and medical records reviewed, there has been a change in cognitive functioning and a reasonable suspicion of neurocognitive disorder.  Clinical Decision Making: In considering the patient's current level of functioning, level of presumed impairment, nature of symptoms, emotional and behavioral responses during the interview, level of literacy, and observed level of motivation, a battery of  tests was selected and communicated to the psychometrician.   Following the clinical interview/neurobehavioral status exam, the patient completed this full battery of neuropsychological testing with my psychometrician under my supervision (see separate note).   PLAN: The patient will return to see me for a follow-up session at which time his test performances and my impressions and treatment recommendations will be reviewed in detail.  Evaluation ongoing; full report to follow.

## 2018-04-20 NOTE — Progress Notes (Signed)
   Neuropsychology Note  Nicholas Gaines completed 75 minutes of neuropsychological testing with technician, Nicholas Gaines, BS, under the supervision of Dr. Elvis CoilMaryBeth Bailar, Licensed Psychologist. The patient did not appear overtly distressed by the testing session, per behavioral observation or via self-report to the technician. Rest breaks were offered.   Clinical Decision Making: In considering the patient's current level of functioning, level of presumed impairment, nature of symptoms, emotional and behavioral responses during the interview, level of literacy, and observed level of motivation/effort, a battery of tests was selected and communicated to the psychometrician.  Communication between the psychologist and technician was ongoing throughout the testing session and changes were made as deemed necessary based on patient performance on testing, technician observations and additional pertinent factors such as those listed above.  Nicholas Gaines will return within approximately 2 weeks for an interactive feedback session with Nicholas Gaines at which time his test performances, clinical impressions and treatment recommendations will be reviewed in detail. The patient understands he can contact our office should he require our assistance before this time.  35 minutes spent performing neuropsychological evaluation services/clinical decision making (psychologist). [CPT 96132] 75 minutes spent face-to-face with patient administering standardized tests, 30 minutes spent scoring (technician). [CPT [id number], 96139]  Full report to follow.

## 2018-05-01 ENCOUNTER — Ambulatory Visit (INDEPENDENT_AMBULATORY_CARE_PROVIDER_SITE_OTHER): Payer: Medicare Other | Admitting: *Deleted

## 2018-05-01 DIAGNOSIS — I639 Cerebral infarction, unspecified: Secondary | ICD-10-CM | POA: Diagnosis not present

## 2018-05-01 LAB — CUP PACEART REMOTE DEVICE CHECK
Implantable Pulse Generator Implant Date: 20180208
MDC IDC SESS DTM: 20190820233855

## 2018-05-01 NOTE — Progress Notes (Signed)
Carelink Summary Report / Loop Recorder 

## 2018-05-08 LAB — CUP PACEART REMOTE DEVICE CHECK
Implantable Pulse Generator Implant Date: 20180208
MDC IDC SESS DTM: 20190922233736

## 2018-05-11 DIAGNOSIS — Z0000 Encounter for general adult medical examination without abnormal findings: Secondary | ICD-10-CM | POA: Diagnosis not present

## 2018-05-11 DIAGNOSIS — R7303 Prediabetes: Secondary | ICD-10-CM | POA: Diagnosis not present

## 2018-05-11 DIAGNOSIS — I6930 Unspecified sequelae of cerebral infarction: Secondary | ICD-10-CM | POA: Diagnosis not present

## 2018-05-11 DIAGNOSIS — E782 Mixed hyperlipidemia: Secondary | ICD-10-CM | POA: Diagnosis not present

## 2018-05-12 NOTE — Progress Notes (Signed)
NEUROPSYCHOLOGICAL EVALUATION   Name:    Nicholas Gaines  Date of Birth:   08-30-1928 Date of Interview:  04/20/2018 Date of Testing:  04/20/2018   Date of Feedback:  05/15/2018       Background Information:  Reason for Referral:  Nicholas Gaines is a 84 y.o. male referred by Dr. Naomie Dean of [facility] to assess his current level of cognitive functioning and assist in differential diagnosis. The current evaluation consisted of a review of available medical records, an interview with the patient and his daughter, Nicholas Gaines, and the completion of a neuropsychological testing battery. Informed consent was obtained.  History of Presenting Problem:  Nicholas Gaines has been followed by Dr. Lucia Gaskins for seizures since April 2018. Seizures started in late 2017 and were characterized by aphasia. The only EEG I see is an awake and drowsy EEG from 08/03/2016 which was normal. He was initially tried on Vimpat and Keppra but had significant side effects with both of these. He has done better with Depakote ER. He does still have some breakthrough seizures but his overall functioning is better. MMSE with Dr. Lucia Gaskins on 11/16/2017 was 24/30. There was a question of normal cognitive aging vs early dementia. Recent brain MRI from 12/08/2017 revealed moderate generalized cortical atrophy, more pronounced in the mesial temporal lobes, and it was noted that the extent of atrophy had progressed noticeably since the 06/22/2016 MRI. There was also evidence of moderate chronic microvascular ischemic change, slightly progressed when compared to the 2017 MRI. There were no acute findings. He has a family history of dementia in his mother, she passed away at age 26.  The patient also has a history of stroke in 2003 confirmed by imaging showing 1 cm acute stroke affecting the cortex in the left parietal lobe with no sign of associated hemorrhage.  At today's visit (04/20/2018), the patient denies any major concerns  about memory. His daughter agrees there is no significant forgetfulness for recent conversations or events, and he is not repeating questions or statements, nor losing or misplacing items. He lives alone (daughter lives 10 mins away and checks on him regularly) and is able to manage basic ADLs as well as many instrumental ADLs. He stopped driving after seizure diagnosis. His daughter prepares his daily pill planner but he self-administers medications daily. He forgets doses once in a while, not very often. His daughter manages his bills and checking account due to his difficulty writing secondary to tremor. He is able to prepare meals and do some cooking without problems. His daughter manages his appointments but he is aware of when they are and does not forget about them. He does his own housekeeping, laundry and shopping.  His daughter reports that she has noticed some word finding difficulty in daily life (separate from aphasia he experiences when having a seizure). He also may call people by the wrong name from time to time (e.g., may refer to her son with her husband's name). However, he does not appear to have receptive language deficits as comprehension (both auditory and reading) are reportedly intact.  The patient has no psychiatric history. He and his daughter report he is "extremely grounded in his faith" and his mood is very even keel. He has never struggled with depression or anxiety. Since he started having seizures and his physical functioning has declined, he has been frustrated with reduced level of activity. Prior to the seizures, he was extremely active physically for his age. He maintained a  full size garden and had good physical strength. Now he has reduced energy and stamina. He can only maintain a small garden. He falls asleep very easily during the day. He has always enjoyed reading but this is harder to do now because he falls asleep when he is sitting down and comfortable. He does not  fall asleep while doing any other activities (e.g., does not fall asleep mid conversation or mid physical task). He has no difficulty sleeping at night. His appetite is good. He has lost some weight but he still eats well. He has not had any falls recently. He was having falls when on Vimpat, but never hit his head or lost consciousness. He does not drink any alcohol. He goes to church regularly and has friends that provide transportation for him (in addition to his daughter).   Social History: Born[place]ised[place] [employer]product [employer] Occupational history: Retired. 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 Marital history: Widowed since 2001. Children: 1 daughter, 1 grandson Alcohol: None Tobacco: Never a smoker or tobacco user   Medical History:  Past Medical History:  Diagnosis Date  . A-fib (HCC)   . Arthritis   . GERD (gastroesophageal reflux disease)   . Heart murmur   . HLD (hyperlipidemia)   . Seizures (HCC)    last sz 04/17/17  . Stroke [facility])    tia    Current medications:  Outpatient Encounter Medications as of 05/15/2018  Medication Sig  . acetaminophen (TYLENOL) 500 MG tablet Take 1,000 mg by mouth every 6 (six) hours as needed.  Marland Kitchen aspirin EC 325 MG tablet Take 325 mg by mouth daily.  . Cholecalciferol 1000 units capsule Take 4,000 Units by mouth daily.   . clopidogrel (PLAVIX) 75 MG tablet Take 75 mg by mouth daily.   . divalproex (DEPAKOTE ER) 250 MG 24 hr tablet Take 1 tablet (250 mg total) by mouth daily. Take with your 500mg  pill for a total of 750mg  daily  . divalproex (DEPAKOTE ER) 500 MG 24 hr tablet Take 1 tablet (500 mg total) by mouth daily.  . ferrous sulfate 325 (65 FE) MG EC tablet Take 325 mg by mouth daily with breakfast.  . ibuprofen (ADVIL,MOTRIN) 200 MG tablet Take 600 mg by mouth daily as needed for headache or moderate pain.   Marland Kitchen LYCOPENE PO Take 10 mg by mouth daily.   . Multiple Vitamins-Minerals (ICAPS) CAPS Take 1 capsule by mouth  daily.  . Omega-3 Fatty Acids (FISH OIL) 1000 MG CAPS Take 2,000 mg by mouth daily.  Marland Kitchen OVER THE COUNTER MEDICATION CBD Oil daily  . ranitidine (ZANTAC) 150 MG tablet Take 300 mg by mouth daily.   . Saw Palmetto 450 MG CAPS Take 900 mg by mouth daily.   . vitamin B-12 (CYANOCOBALAMIN) 1000 MCG tablet Take 1,000 mcg by mouth daily.  Marland Kitchen ZETIA 10 MG tablet Take 10 mg by mouth daily.    No facility-administered encounter medications on file as of 05/15/2018.      Current Examination:  Behavioral Observations:  Appearance: Neatly and appropriately dressed and groomed Gait: Ambulated independently, shuffling gait Speech: Fluent; mildly increased response latencies, soft volume, mildly slowed rate, normal rhythm. Minimal word finding difficulty noted during conversational speech. Thought process: Generally linear, mildly tangential at times Affect: Full, euthymic Interpersonal: Very pleasant, appropriate Orientation: Oriented to person, place and time but could not name the current President nor his predecessor.  Tests Administered: . Test of Premorbid  Functioning (TOPF) . Wechsler Adult Intelligence Scale-Fourth Edition (WAIS-IV): Similarities, Clinical cytogeneticist, Coding and Digit Span subtests . DIRECTV Verbal Learning Test - 2nd Edition (CVLT-2) Short Form . Repeatable Battery for the Assessment of Neuropsychological Status (RBANS) Form A:  Story Memory and Story Recall subtests, Figure Copy and Recall subtests, Semantic Fluency subtest and Coding subtest . Boston Naming Test (BNT) . Boston Diagnostic Aphasia Examination: Complex Ideational Material subtest . Controlled Oral Word Association Test (COWAT) . Trail Making Test A and B . Clock drawing test . Geriatric Depression Scale (GDS) 15 Item . Generalized Anxiety Disorder - 7 item screener (GAD-7)  Test Results: Note: Standardized scores are presented only for use by appropriately trained professionals and to allow for any future  test-retest comparison. These scores should not be interpreted without consideration of all the information that is contained in the rest of the report. The most recent standardization samples from the test publisher or other sources were used whenever possible to derive standard scores; scores were corrected for age, gender, ethnicity and education when available.   Test Scores:  Test Name Raw Score Standardized Score Descriptor  TOPF 28/70 SS= 93 Average  WAIS-IV Subtests     Similarities 14/36 ss= 7 Low average  Block Design 3/66 ss= 2 Impaired  Digit Span Forward 8/16 ss= 8 Low end of average  Digit Span Backward 5/16 ss= 7 Low average  RBANS Subtests     Story Memory 17/24 Z= 0.4 Average  Story Recall 7/12 Z= -0.1 Average  Figure Copy 13/20 Z= -2.2 Impaired  Figure Recall 6/20 Z= -1.3 Low average  Semantic Fluency 12/40 Z= -1.5 Borderline  Coding Pt unable    CVLT-II Scores     Trial 1 4/9 Z= -0.5 Average  Trial 4 6/9 Z= -0.5 Average  Trials 1-4 total 18/36 T= 44 Average  SD Free Recall 4/9 Z= -1 Low average  LD Free Recall 3/9 Z= -0.5 Average  LD Cued Recall 3/9 Z= -1 Low average  Recognition Discriminability 7/9 hits 0 false positives Z= 1 High average  Forced Choice Recognition 9/9  WNL  BNT 26/60 T= 15 Severely impaired  BDAE Subtest     Complex Ideational Material 10/12  WNL  COWAT-FAS 19 T= 41 Low average  COWAT-Animals 7 T= 30 Impaired  Trail Making Test A  202" 1 error T= -4 Severely impaired  Trail Making Test B  D/c at 300" (on #9) 1 error    Clock Drawing   Impaired  GDS-15 2/15  WNL  GAD-7 1/21  WNL      Description of Test Results:  Premorbid verbal intellectual abilities were estimated to have been within the average range based on a test of word reading. Psychomotor processing speed was severely impaired. Auditory attention and working memory were low average. Visual-spatial construction was impaired. Language abilities assessed were mostly below  expectation. Specifically, confrontation naming was severely impaired, and semantic verbal fluency was borderline to impaired. Auditory comprehension of complex ideational material was within normal limits. With regard to verbal memory, encoding and acquisition of non-contextual information (i.e., word list) was average. After a brief distracter task, free recall was low average (4/9 items). After a delay, free recall was average (3/9 items). He did not benefit from semantic cueing (low average). Performance on a yes/no recognition task was high average. On another verbal memory test, encoding and acquisition of contextual auditory information (i.e., short story) was average. After a delay, free recall was average. With regard to  non-verbal memory, delayed free recall of visual information was low average. Performances on tests measuring various aspects of executive functioning were variable. Mental flexibility and set-shifting were relatively intact but very slow; he was unable to finish Trails B within the allotted time but only committed one set loss error. Verbal fluency with phonemic search restrictions was low average. Verbal abstract reasoning was low average. Performance on a clock drawing task was impaired due to incorrect time placement. On a self-report measures of mood, the patient's responses were not indicative of clinically significant depression or anxiety at the present time.    Clinical Impressions: Nonamnestic Mild Cognitive Impairment.  Neurocognitive testing results demonstrate strengths in memory encoding/retrieval and in executive functioning. Meanwhile, psychomotor processing speed is slowed, and impairments are seen in Banker and semantic retrieval (confrontation naming, semantic fluency). His performances and current functioning do not warrant a diagnosis of dementia. 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 His cognitive profile is not  consistent with typical Alzheimer's disease given that memory encoding/retrieval are intact for age, but recent neuroimaging did show significantly progressed medial temporal atrophy in the past 2 years.  I suspect some cognitive impairment, particularly slowed processing speed, could be related to AEDs (valproic acid may be associated with a higher risk of both cognitive dysfunction and Parkinsonism/tremor in the elderly.) Semantic retrieval deficits could be related to prior stroke in the left parietal lobe and newer onset seizures (and I wonder if newer onset of seizures could be related to damage from his old stroke in the left parietal lobe).  Fortunately, he is not reporting symptoms consistent with a mood disorder, but he does appear to be experiencing some mild, adjustment-related emotional difficulty.   Recommendations/Plan: Based on the findings of the present evaluation, the following recommendations are offered:  1. The patient appears to have appropriate level of support with IADLs. With this level of support, I believe he is safe to live independently. 2. Continue social engagement. He is doing great with this. 3. We talked about the need to find alternative activities/hobbies to more physically strenuous activities he used to enjoy like gardening. 4. Especially if there is any concern about worsening cognitive impairment, neurocognitive re-evaluation in one year is recommended.   Feedback to Patient: Nicholas Gaines and his daughter returned for a feedback appointment on 05/15/2018 to review the results of his neuropsychological evaluation with this provider. 25 minutes face-to-face time was spent reviewing his test results, my impressions and my recommendations as detailed above.    Total time spent on this patient's case: 70 minutes for neurobehavioral status exam with psychologist (CPT code 16109); 105 minutes of testing/scoring by psychometrician under psychologist's supervision  (CPT codes [phone], [phone] units); 180 minutes for integration of patient data, interpretation of standardized test results and clinical data, clinical decision making, treatment planning and preparation of this report, and interactive feedback with review of results to the patient/family by psychologist (CPT codes [phone], [phone] units).      Thank you for your referral of Nicholas Gaines. Please feel free to contact me if you have any questions or concerns regarding this report.

## 2018-05-15 ENCOUNTER — Encounter: Payer: Self-pay | Admitting: Psychology

## 2018-05-15 ENCOUNTER — Other Ambulatory Visit: Payer: Self-pay | Admitting: Neurology

## 2018-05-15 ENCOUNTER — Ambulatory Visit (INDEPENDENT_AMBULATORY_CARE_PROVIDER_SITE_OTHER): Payer: Medicare Other | Admitting: Psychology

## 2018-05-15 DIAGNOSIS — G3184 Mild cognitive impairment, so stated: Secondary | ICD-10-CM

## 2018-05-15 DIAGNOSIS — R569 Unspecified convulsions: Secondary | ICD-10-CM

## 2018-05-22 ENCOUNTER — Encounter: Payer: Self-pay | Admitting: Neurology

## 2018-05-22 ENCOUNTER — Ambulatory Visit: Payer: Medicare Other | Admitting: Neurology

## 2018-05-29 ENCOUNTER — Encounter: Payer: Self-pay | Admitting: Neurology

## 2018-05-29 ENCOUNTER — Ambulatory Visit: Payer: Medicare Other | Admitting: Neurology

## 2018-05-29 VITALS — BP 111/74 | HR 74 | Ht 63.0 in | Wt 153.0 lb

## 2018-05-29 DIAGNOSIS — I6930 Unspecified sequelae of cerebral infarction: Secondary | ICD-10-CM

## 2018-05-29 DIAGNOSIS — R7303 Prediabetes: Secondary | ICD-10-CM

## 2018-05-29 DIAGNOSIS — E782 Mixed hyperlipidemia: Secondary | ICD-10-CM

## 2018-05-29 DIAGNOSIS — G40209 Localization-related (focal) (partial) symptomatic epilepsy and epileptic syndromes with complex partial seizures, not intractable, without status epilepticus: Secondary | ICD-10-CM | POA: Insufficient documentation

## 2018-05-29 DIAGNOSIS — G3184 Mild cognitive impairment, so stated: Secondary | ICD-10-CM | POA: Diagnosis not present

## 2018-05-29 DIAGNOSIS — G40201 Localization-related (focal) (partial) symptomatic epilepsy and epileptic syndromes with complex partial seizures, not intractable, with status epilepticus: Secondary | ICD-10-CM

## 2018-05-29 NOTE — Patient Instructions (Signed)

## 2018-05-29 NOTE — Progress Notes (Signed)
[facility]    Provider:  Dr Lucia Gaskins Referring Provider: [name], MD Primary Care Physician:  [name], MD  CC:  seizures, MCI  Interval history 05/29/2018: He had formal neurocognitive testing with Dr. Alinda Dooms and diagnosed Mild Cognitive Impairment. He feels well on the Depakote. He has daytime sleepiness and loose stools with the Depakote. He is on 750mg  a day. He has had 2 seizures in the last 6 months, they do not want to increase his Depakote. He does not drive. Discussed mood, he may have some adjustment disorder. His mood is "liveable" but he doesn't feel depressed or that he needs some help with mood.Here with his daughter, he is doing well, Depakote has some side effects but has been the best AED we have tried will continue with this. He goes to church. Discussed formal neurocognitive testing: MCI.  Reviewed imaging with patient/daughter. I agree with below :  IMPRESSION: This MRI of the brain without contrast shows the following: 1.    Moderate generalized cortical atrophy, more pronounced in the mesial temporal lobes.  The extent of atrophy has progressed noticeably since the 06/22/2016 MRI. 2.    Moderate chronic microvascular ischemic change, slightly progressed when compared to the 2017 MRI. 3.    There are no acute findings.  IMPRESSION: This MRI of the cervical spine without contrast shows the following: 1.    The spinal cord appears normal.   There is no spinal stenosis 2.    At C2-C3, there is severe loss of disc height and uncovertebral spurring causing moderately severe bilateral foraminal narrowing that could lead to C3 nerve root compression to either side. 3.    At C3-C4, there are degenerative changes causing moderately severe left foraminal narrowing with potential for left C4 nerve root compression. 4.    At C5-C6, there are degenerative changes and severe reduced disc height causing moderately severe right foraminal narrowing with  potential for right C6 nerve root compression. 5.    There are moderate degenerative changes at the other cervical levels with less potential for nerve root compression  Interval history 11/16/2017:  Patient had diarrhea to Depakote and he was changed to 500mg  ER daily.  He is here with his daughter. Diarrhea went away. Doing well on the extended release. He has had 2 breakthrough seizures. He has been having more good days. Some days he feels like running, he is working in the garden. His speech is a lot better. Has had 2 breakthrough seizures so will increase to 750mg  day. He feels his walking is better. No falls. He has been to physical therapy or balance. He has arthritis in his hips and he drags his feet. No neck pain, he has disk in his back that gives him problems. But he still has imbalance. He still feels he has problems with memory. Daughter provides information. Daughter says he is doing better.   Interval history 02/16/2018: They are on vimpat twice a day 100mg . Having side effects from Vimpat. His balance is not as good as it was. He was fatigued with the Keppra but he has a decline with vimpat, reaction time is very slow, remembering how to work a cell phone. Daughter has to use manage his pills and his he has word-finding difficulty. They are concerned about quality of life. Decline din the last 12 months since the seizures. He fell three times when on Vimpat. He is not driving. He lives by himself bit he has first alert. Daughter  lives 10 minutes away. His mother had dementia she passed away at 50. He has more short-term memory loss. Discussed risks of coming off of medication, seizures can cause significant morbidity and mortality. They still decline any more medications at this time. He has difficulty remembering names and words. We can take him off of the medication and then review again. Denies depression. Will decrease medication and then have him come back in 8 weeks for dementia  evaluation. No appetite.   Reviewed CT scan of the head and agree with the following: FINDINGS: Brain: There is generalized age related parenchymal atrophy with commensurate dilatation of the ventricles and sulci. Chronic small vessel ischemic changes noted within the periventricular and subcortical white matter regions bilaterally. Additional small old lacunar infarcts noted within each basal ganglia region.  There is no mass, hemorrhage, edema or other evidence of acute parenchymal abnormality. No extra-axial hemorrhage.  Vascular: There are chronic calcified atherosclerotic changes of the large vessels at the skull base. No unexpected hyperdense vessel.  Skull: Normal. Negative for fracture or focal lesion.  Sinuses/Orbits: No acute finding.  Other: None.  IMPRESSION: 1. No acute findings.  No intracranial mass, hemorrhage or edema. 2. Atrophy and chronic ischemic changes as detailed above.  Cbc with anemia, Cmp with slightly dec GFR but largely unremarkable   HPI:  Nicholas Gaines is a 84 y.o. male here as a referral from Dr. Azucena Cecil for TIA vs seizures. Past medical history of atrial fibrillation, heart murmur, stroke in 2002, esophageal stricture, hyperlipidemia, TIA, prediabetic. He is on aspirin 325 and Plavix 75. Patient has had several episodes of staring, confusion, disorientation and unintelligible speech. Usually back to baseline within a half hour. No distinct recollection of the events and only slightly before and after them. No weakness or numbness or incoordination or gait disturbances. No headaches. Episodes happened when he was feeling well but also in the setting of fevers and cough and respiratory infection. The first episode was in November, he was fishing and he tried to talk and couldn't get his words out, lasted 15 minutes, he could talk when he got home. Daughter is here and provides much information, she saw him later that evening and it was fine. He denies any  other associated symptoms such as weakness or confusion or vision changes. His friend in the car noticed that there was something wrong, no LOC, no urination or abnormal movements, he was in his usual state of health at the time no new medications or anything new but he was starting to feel ill at the time. Next episode was on Christmas eve in the setting of illness, daughter was calling him and he did not answer and he was sitting in the car with his wet clothes on from vomiting and he was asleep in the car, he was weak, he was running a fever of 102 and he was not taken to the hospital. On Christmas day he didn't recognize family, he was speaking in "word salad" and he was aimlessly wandering around the house and looking into space and not saying anything, couldn;t tell his daughter's name. No events since being discharged from the hospital. No current focal neurologic deficits, other associated symptoms or modifiable factors. He has not had any more incidents since starting AEDs, was started on trileptal in the hospital.   Reviewed notes, labs and imaging from outside physicians, which showed:   LDL 69, BUN 19, creatinine 1.1, A1c 5.6 08/03/2016. TSH 1.9 11/14/2015.  Patient presented to  the emergency room on 07/03/2016 with fever, cough and episodic confusion. He said 2 episodes in the last several days where he had become discretely confused. Patient had a TIA in November diagnosed by primary care. He had been having on and off fevers, shortness of breath, cough and was recently started on antibiotics. Patient was unable to speak during his TIA, he remembered trying to speak and not being able to. He had a outpatient workup for TIA with his PCP that showed MRI with old stroke in 2002 and is getting loose recorder for paroxysmal A. fib. Episodes of confusion were described as disorientation and staring with associated difficulty speaking. Episodes lasted 30-60 minutes then resolved spontaneously.  Patient did not have any weakness, numbness, vision or hearing loss. Episodes of happened in the setting of temperature and cough but also happen when patient was feeling well. On admission he was found to have elevated white blood cells 13, CT of the head was negative for acute intracranial abnormalities and neurology was consulted. Neurology felt that these were suggestive of complex partial seizures and not TIAs. Routine EEG was recommended for outpatient 72 hour ambulatory EEG.  Personally reviewed images and agree with the following: MRI brain 06/22/2016: 1. No acute intracranial abnormality. 2. Mild chronic microvascular ischemic changes and parenchymal volume loss for age. Few scattered punctate foci of chronic microhemorrhage are nonspecific but also likely related to microvascular ischemic changes.  Review of Systems: Patient complains of symptoms per HPI as well as the following symptoms: Fatigue, cough, confusion, seizure. Pertinent negatives per HPI. All others negative.    Social History   Socioeconomic History  . Marital status: Widowed    Spouse name: Not on file  . Number of children: 1  . Years of education: 42  . Highest education level: Not on file  Occupational History  . Occupation: Retired  Engineer, production  . Financial resource strain: Not on file  . Food insecurity:    Worry: Not on file    Inability: Not on file  . Transportation needs:    Medical: Not on file    Non-medical: Not on file  Tobacco Use  . Smoking status: Never Smoker  . Smokeless tobacco: Never Used  Substance and Sexual Activity  . Alcohol use: No    Alcohol/week: 0.0 standard drinks  . Drug use: No  . Sexual activity: Not on file  Lifestyle  . Physical activity:    Days per week: Not on file    Minutes per session: Not on file  . Stress: Not on file  Relationships  . Social connections:    Talks on phone: Not on file    Gets together: Not on file    Attends religious service: Not on  file    Active member of club or organization: Not on file    Attends meetings of clubs or organizations: Not on file    Relationship status: Not on file  . Intimate partner violence:    Fear of current or ex partner: Not on file    Emotionally abused: Not on file    Physically abused: Not on file    Forced sexual activity: Not on file  Other Topics Concern  . Not on file  Social History Narrative   04/19/17 Lives alone    Caffeine use: cup of coffee daily   Right handed    Family History  Problem Relation Age of Onset  . Hypertension Mother   . Parkinson's disease Mother   .  Heart attack Mother   . Stroke Sister   . Lymphoma Sister   . Stroke Brother   . CVA Father   . Stroke Paternal Grandmother        in her early 89's  . Stroke Paternal Grandfather        in his early 78's  . Pulmonary embolism Sister        04/2018  . Diabetes Neg Hx     Past Medical History:  Diagnosis Date  . A-fib (HCC)   . Arthritis   . GERD (gastroesophageal reflux disease)   . Heart murmur   . HLD (hyperlipidemia)   . Seizures (HCC)    last sz 04/17/17  . Stroke [facility])    tia    Past Surgical History:  Procedure Laterality Date  . CARPAL TUNNEL RELEASE     About 10 yr ago (08/13/16)  . LOOP RECORDER INSERTION N/A 09/16/2016   Procedure: Loop Recorder Insertion;  Surgeon: Hillis Range, MD;  Location: [facility] INVASIVE CV LAB;  Service: Cardiovascular;  Laterality: N/A;  . TONSILLECTOMY      Current Outpatient Medications  Medication Sig Dispense Refill  . acetaminophen (TYLENOL) 500 MG tablet Take 1,000 mg by mouth every 6 (six) hours as needed.    Marland Kitchen aspirin EC 325 MG tablet Take 325 mg by mouth daily.    . Cholecalciferol 1000 units capsule Take 4,000 Units by mouth daily.     . clopidogrel (PLAVIX) 75 MG tablet Take 75 mg by mouth daily.   0  . divalproex (DEPAKOTE ER) 250 MG 24 hr tablet Take 1 tablet (250 mg total) by mouth daily. Take with your 500mg  pill for a total of 750mg  daily 30  tablet 11  . divalproex (DEPAKOTE ER) 500 MG 24 hr tablet TAKE 1 TABLET BY MOUTH EVERY DAY. TAKE WITH 250MG  TABLET FOR TOTAL OF 750MG  90 tablet 1  . ferrous sulfate 325 (65 FE) MG EC tablet Take 325 mg by mouth daily with breakfast.    . ibuprofen (ADVIL,MOTRIN) 200 MG tablet Take 600 mg by mouth daily as needed for headache or moderate pain.     Marland Kitchen LYCOPENE PO Take 10 mg by mouth daily.     . Multiple Vitamins-Minerals (ICAPS) CAPS Take 1 capsule by mouth daily.    . Omega-3 Fatty Acids (FISH OIL) 1000 MG CAPS Take 2,000 mg by mouth daily.    . ranitidine (ZANTAC) 150 MG tablet Take 300 mg by mouth daily.   0  . Saw Palmetto 450 MG CAPS Take 900 mg by mouth daily.     . vitamin B-12 (CYANOCOBALAMIN) 1000 MCG tablet Take 1,000 mcg by mouth daily.    Marland Kitchen ZETIA 10 MG tablet Take 10 mg by mouth daily.   0   No current facility-administered medications for this visit.     Allergies as of 05/29/2018 - Review Complete 05/29/2018  Allergen Reaction Noted  . Rocephin [ceftriaxone sodium in dextrose] Other (See Comments) 08/02/2016  . Atorvastatin  08/11/2016  . Pravastatin  08/11/2016  . Rosuvastatin calcium  08/11/2016    Vitals: BP 111/74 (BP Location: Right Arm, Patient Position: Sitting)   Pulse 74   Ht 5\' 3"  (1.6 m)   Wt 153 lb (69.4 kg)   BMI 27.10 kg/m  Last Weight:  Wt Readings from Last 1 Encounters:  05/29/18 153 lb (69.4 kg)   Last Height:   Ht Readings from Last 1 Encounters:  05/29/18 5\' 3"  (1.6 m)  Assessment/Plan:   Physical exam: Exam: Gen: NAD, conversant, well nourised, obese, well groomed                     Eyes: Conjunctivae clear without exudates or hemorrhage    Neuro: Detailed Neurologic Exam  Speech:    Speech is normal; fluent and spontaneous with normal comprehension.  Cognition:     MMSE - Mini Mental State Exam 11/16/2017  Orientation to time 5  Orientation to Place 3  Registration 3  Attention/ Calculation 3  Recall 2  Language- name 2  objects 2  Language- repeat 1  Language- follow 3 step command 3  Language- read & follow direction 1  Write a sentence 1  Copy design 0  Total score 24     Cranial Nerves:    The pupils are equal, round, and reactive to light. Attempted fundoscopic exam could not visualize. Visual fields are full to finger confrontation. Extraocular movements are intact. Trigeminal sensation is intact and the muscles of mastication are normal. The face is symmetric. The palate elevates in the midline. Hearing intact. Voice is normal. Shoulder shrug is normal. The tongue has normal motion without fasciculations.   Coordination:    Normal finger to nose and heel to shin.   Gait:    Slight shuffling and stooped posture, imbalance, decreased arm swing  Motor Observation:    No asymmetry, no atrophy, and no involuntary movements noted. Tone:    Normal muscle tone.      Strength:    Strength is V/V in the upper and lower limbs.      Sensation: intact to LT     Reflex Exam:  DTR's:    Deep tendon reflexes in the upper and lower extremities are brisk bilaterally.   Toes:    The toes are downgoing bilaterally.   Clonus:    Clonus is absent.       Assessment/Plan:  84 y.o. male here as a follow up for seizures and memory loss. Did not tolerate Vimpat, Keppra now doing well on Depakote ER (did not tolerate Depakote IR).   - MCI, see formal neurocognitive testing by Dr. Alinda Dooms 04/20/2018, discussed with patient and daughter - Continue Depakote for seizures likely from left parietal encephalomalacia s/p stroke - No snoring, daughter spent the night - Denies depression or mood problems - Patient is unable to drive, operate heavy machinery, perform activities at heights or participate in water activities alone. - Discussed seizure precautions and risk of breakthrough seizure while adjusting medications - Had  loop recorder placement and TEE - follow up one year - sleeps quietly, sleeps  well, no signs of OSA per daughter she just spent a week with him and he feels refreshed in the morning.   I had a long d/w patient about stroke, risk for recurrent stroke/TIAs, personally independently reviewed imaging studies and stroke evaluation results and answered questions.Continue Plavix for secondary stroke prevention and maintain strict control of hypertension with blood pressure goal below 130/90, diabetes with hemoglobin A1c goal below 6.5% and lipids with LDL cholesterol goal below 70 mg/dL I also advised the patient to eat a healthy diet with plenty of whole grains, cereals, fruits and vegetables, exercise regularly and maintain ideal body weight .Followup in the future with me in1 year or call earlier if necessary.  Cc:  Sissy Hoff, MD       Naomie Dean, MD  [facility] [address] [place], [place] [address]  Phone [phone] Fax [phone]  A total of 25 minutes was spent face-to-face with this patient. Over half this time was spent on counseling patient on the  1. H/O: stroke with residual effects   2. MCI (mild cognitive impairment)   3. Mixed hyperlipidemia   4. Prediabetes   5. Complex partial seizure with impairment of consciousness at onset [facility])     diagnosis and different diagnostic and therapeutic options, counseling and coordination of care, risks ans benefits of management, compliance, or risk factor reduction and education.

## 2018-06-02 ENCOUNTER — Ambulatory Visit (INDEPENDENT_AMBULATORY_CARE_PROVIDER_SITE_OTHER): Payer: Medicare Other | Admitting: *Deleted

## 2018-06-02 DIAGNOSIS — R002 Palpitations: Secondary | ICD-10-CM | POA: Diagnosis not present

## 2018-06-03 NOTE — Progress Notes (Signed)
Carelink Summary Report / Loop Recorder 

## 2018-06-04 LAB — CUP PACEART REMOTE DEVICE CHECK
Date Time Interrogation Session: 20191025233657
MDC IDC PG IMPLANT DT: 20180208

## 2018-06-08 DIAGNOSIS — R6889 Other general symptoms and signs: Secondary | ICD-10-CM | POA: Diagnosis not present

## 2018-06-08 DIAGNOSIS — R5383 Other fatigue: Secondary | ICD-10-CM | POA: Diagnosis not present

## 2018-06-08 DIAGNOSIS — E611 Iron deficiency: Secondary | ICD-10-CM | POA: Diagnosis not present

## 2018-07-03 ENCOUNTER — Telehealth: Payer: Self-pay | Admitting: Cardiology

## 2018-07-03 NOTE — Telephone Encounter (Signed)
Spoke w/ pt daughter and requested that he send a manual transmission b/c his home monitor has not updated in at least 14 days.   

## 2018-07-05 ENCOUNTER — Ambulatory Visit (INDEPENDENT_AMBULATORY_CARE_PROVIDER_SITE_OTHER): Payer: Medicare Other

## 2018-07-05 DIAGNOSIS — I639 Cerebral infarction, unspecified: Secondary | ICD-10-CM | POA: Diagnosis not present

## 2018-07-10 NOTE — Progress Notes (Signed)
Carelink Summary Report / Loop Recorder 

## 2018-08-07 ENCOUNTER — Ambulatory Visit (INDEPENDENT_AMBULATORY_CARE_PROVIDER_SITE_OTHER): Payer: Medicare Other

## 2018-08-07 DIAGNOSIS — R002 Palpitations: Secondary | ICD-10-CM

## 2018-08-08 LAB — CUP PACEART REMOTE DEVICE CHECK
Implantable Pulse Generator Implant Date: 20180208
MDC IDC SESS DTM: 20191231010603

## 2018-08-08 NOTE — Progress Notes (Signed)
Carelink Summary Report / Loop Recorder 

## 2018-08-20 LAB — CUP PACEART REMOTE DEVICE CHECK
MDC IDC PG IMPLANT DT: 20180208
MDC IDC SESS DTM: 20191128003908

## 2018-08-30 ENCOUNTER — Encounter: Payer: Medicare Other | Admitting: Nurse Practitioner

## 2018-08-30 NOTE — Progress Notes (Signed)
Electrophysiology Office Note Date: 08/31/2018  ID:  Nicholas Gaines, DOB 09/04/1928, MRN [id number]  PCP: Tally JoeSwayne, David, MD Primary Cardiologist: Eden EmmsNishan Electrophysiologist: Allred  CC: palpitations follow up  Nicholas Gaines is a 85 y.o. male seen today for Dr Johney FrameAllred.  He presents today for routine electrophysiology followup.  Since last being seen in our clinic, the patient reports doing very well.  He denies chest pain, palpitations, dyspnea, PND, orthopnea, nausea, vomiting, dizziness, syncope, edema, weight gain, or early satiety.  Past Medical History:  Diagnosis Date  . A-fib (HCC)   . Arthritis   . GERD (gastroesophageal reflux disease)   . Heart murmur   . HLD (hyperlipidemia)   . Seizures (HCC)    last sz 04/17/17  . Stroke [facility](HCC)    tia   Past Surgical History:  Procedure Laterality Date  . CARPAL TUNNEL RELEASE     About 10 yr ago (08/13/16)  . LOOP RECORDER INSERTION N/A 09/16/2016   Procedure: Loop Recorder Insertion;  Surgeon: Hillis RangeJames Allred, MD;  Location: [facility];  Service: Cardiovascular;  Laterality: N/A;  . TONSILLECTOMY      Current Outpatient Medications  Medication Sig Dispense Refill  . acetaminophen (TYLENOL) 500 MG tablet Take 1,000 mg by mouth every 6 (six) hours as needed.    Marland Kitchen. aspirin EC 325 MG tablet Take 325 mg by mouth daily.    . Cholecalciferol 1000 units capsule Take 4,000 Units by mouth daily.     . clopidogrel (PLAVIX) 75 MG tablet Take 75 mg by mouth daily.   0  . divalproex (DEPAKOTE ER) 250 MG 24 hr tablet Take 1 tablet (250 mg total) by mouth daily. Take with your 500mg  pill for a total of 750mg  daily 30 tablet 11  . divalproex (DEPAKOTE ER) 500 MG 24 hr tablet TAKE 1 TABLET BY MOUTH EVERY DAY. TAKE WITH 250MG  TABLET FOR TOTAL OF 750MG  90 tablet 1  . ferrous sulfate 325 (65 FE) MG EC tablet Take 325 mg by mouth daily with breakfast.    . ibuprofen (ADVIL,MOTRIN) 200 MG tablet Take 600 mg by mouth daily as needed for headache or  moderate pain.     Marland Kitchen. LYCOPENE PO Take 10 mg by mouth daily.     . Multiple Vitamins-Minerals (ICAPS) CAPS Take 1 capsule by mouth daily.    . Omega-3 Fatty Acids (FISH OIL) 1000 MG CAPS Take 2,000 mg by mouth daily.    . ranitidine (ZANTAC) 150 MG tablet Take 300 mg by mouth daily.   0  . Saw Palmetto 450 MG CAPS Take 900 mg by mouth daily.     . vitamin B-12 (CYANOCOBALAMIN) 1000 MCG tablet Take 1,000 mcg by mouth daily.    Marland Kitchen. ZETIA 10 MG tablet Take 10 mg by mouth daily.   0   No current facility-administered medications for this visit.     Allergies:   Rocephin [ceftriaxone sodium in dextrose]; Atorvastatin; Pravastatin; and Rosuvastatin calcium   Social History: Social History   Socioeconomic History  . Marital status: Widowed    Spouse name: Not on file  . Number of children: 1  . Years of education: 6912  . Highest education level: Not on file  Occupational History  . Occupation: Retired  Engineer, productionocial Needs  . Financial resource strain: Not on file  . Food insecurity:    Worry: Not on file    Inability: Not on file  . Transportation needs:    Medical: Not on  file    Non-medical: Not on file  Tobacco Use  . Smoking status: Never Smoker  . Smokeless tobacco: Never Used  Substance and Sexual Activity  . Alcohol use: No    Alcohol/week: 0.0 standard drinks  . Drug use: No  . Sexual activity: Not on file  Lifestyle  . Physical activity:    Days per week: Not on file    Minutes per session: Not on file  . Stress: Not on file  Relationships  . Social connections:    Talks on phone: Not on file    Gets together: Not on file    Attends religious service: Not on file    Active member of club or organization: Not on file    Attends meetings of clubs or organizations: Not on file    Relationship status: Not on file  . Intimate partner violence:    Fear of current or ex partner: Not on file    Emotionally abused: Not on file    Physically abused: Not on file    Forced sexual  activity: Not on file  Other Topics Concern  . Not on file  Social History Narrative   04/19/17 Lives alone    Caffeine use: cup of coffee daily   Right handed    Family History: Family History  Problem Relation Age of Onset  . Hypertension Mother   . Parkinson's disease Mother   . Heart attack Mother   . Stroke Sister   . Lymphoma Sister   . Stroke Brother   . CVA Father   . Stroke Paternal Grandmother        in her early 78's  . Stroke Paternal Grandfather        in his early 75's  . Pulmonary embolism Sister        04/2018  . Diabetes Neg Hx     Review of Systems: All other systems reviewed and are otherwise negative except as noted above.   Physical Exam: VS:  BP 126/74   Pulse (!) 119   Ht 5\' 3"  (1.6 m)   Wt 147 lb (66.7 kg)   SpO2 94%   BMI 26.04 kg/m  , BMI Body mass index is 26.04 kg/m. Wt Readings from Last 3 Encounters:  08/31/18 147 lb (66.7 kg)  05/29/18 153 lb (69.4 kg)  11/16/17 159 lb 12.8 oz (72.5 kg)    GEN- The patient is elderly appearing, alert and oriented x 3 today.   HEENT: normocephalic, atraumatic; sclera clear, conjunctiva pink; hearing intact; oropharynx clear; neck supple  Lungs- Clear to ausculation bilaterally, normal work of breathing.  No wheezes, rales, rhonchi Heart- Regular rate and rhythm  GI- soft, non-tender, non-distended, bowel sounds present  Extremities- no clubbing, cyanosis, or edema  MS- no significant deformity or atrophy Skin- warm and dry, no rash or lesion  Psych- euthymic mood, full affect Neuro- strength and sensation are intact   EKG:  EKG is not ordered today.  Recent Labs: 11/16/2017: ALT 11; BUN 14; Creatinine, Ser 0.97; Hemoglobin 11.5; Platelets 161; Potassium 4.6; Sodium 142; TSH 3.930    Other studies Reviewed: Additional studies/ records that were reviewed today include: Dr Jenel Lucks office notes   Assessment and Plan:  1.  Palpitations in the setting of prior CVA No AF to date Continue  remote monitoring    Current medicines are reviewed at length with the patient today.   The patient does not have concerns regarding his medicines.  The following  changes were made today:  none  Labs/ tests ordered today include: none Orders Placed This Encounter  Procedures  . CUP PACEART INCLINIC DEVICE CHECK     Disposition:   Follow up with Dr Johney Frame 1 year      Signed, Gypsy Balsam, NP 08/31/2018 10:20 AM   [facility] [address] [place] [address] [phone] (office) [phone] (fax)

## 2018-08-31 ENCOUNTER — Ambulatory Visit: Payer: Medicare Other | Admitting: Nurse Practitioner

## 2018-08-31 VITALS — BP 126/74 | HR 119 | Ht 63.0 in | Wt 147.0 lb

## 2018-08-31 DIAGNOSIS — I639 Cerebral infarction, unspecified: Secondary | ICD-10-CM

## 2018-08-31 DIAGNOSIS — R002 Palpitations: Secondary | ICD-10-CM | POA: Diagnosis not present

## 2018-08-31 LAB — CUP PACEART INCLINIC DEVICE CHECK
Date Time Interrogation Session: 20200123100950
MDC IDC PG IMPLANT DT: 20180208

## 2018-08-31 NOTE — Patient Instructions (Signed)
Medication Instructions:  none If you need a refill on your cardiac medications before your next appointment, please call your pharmacy.   Lab work: none If you have labs (blood work) drawn today and your tests are completely normal, you will receive your results only by: . MyChart Message (if you have MyChart) OR . A paper copy in the mail If you have any lab test that is abnormal or we need to change your treatment, we will call you to review the results.  Testing/Procedures: none  Follow-Up: At [facility], you and your health needs are our priority.  As part of our continuing mission to provide you with exceptional heart care, we have created designated Provider Care Teams.  These Care Teams include your primary Cardiologist (physician) and Advanced Practice Providers (APPs -  Physician Assistants and Nurse Practitioners) who all work together to provide you with the care you need, when you need it. You will need a follow up appointment in 1 years.  Please call our office 2 months in advance to schedule this appointment.  You may see Dr Allred or one of the following Advanced Practice Providers on your designated Care Team:   Amber Seiler, NP . Renee Ursuy, PA-C  Any Other Special Instructions Will Be Listed Below (If Applicable).    

## 2018-09-11 ENCOUNTER — Ambulatory Visit (INDEPENDENT_AMBULATORY_CARE_PROVIDER_SITE_OTHER): Payer: Medicare Other

## 2018-09-11 DIAGNOSIS — R002 Palpitations: Secondary | ICD-10-CM | POA: Diagnosis not present

## 2018-09-14 LAB — CUP PACEART REMOTE DEVICE CHECK
Date Time Interrogation Session: 20200202010524
MDC IDC PG IMPLANT DT: 20180208

## 2018-09-20 NOTE — Progress Notes (Signed)
Carelink Summary Report / Loop Recorder 

## 2018-10-02 ENCOUNTER — Ambulatory Visit (INDEPENDENT_AMBULATORY_CARE_PROVIDER_SITE_OTHER): Payer: Medicare Other

## 2018-10-02 ENCOUNTER — Ambulatory Visit: Payer: Medicare Other | Admitting: Podiatry

## 2018-10-02 ENCOUNTER — Other Ambulatory Visit: Payer: Self-pay | Admitting: Podiatry

## 2018-10-02 DIAGNOSIS — M10071 Idiopathic gout, right ankle and foot: Secondary | ICD-10-CM | POA: Diagnosis not present

## 2018-10-02 DIAGNOSIS — M1000 Idiopathic gout, unspecified site: Secondary | ICD-10-CM | POA: Diagnosis not present

## 2018-10-02 DIAGNOSIS — M79671 Pain in right foot: Secondary | ICD-10-CM

## 2018-10-02 DIAGNOSIS — L601 Onycholysis: Secondary | ICD-10-CM

## 2018-10-07 NOTE — Progress Notes (Signed)
Subjective:   Patient ID: Nicholas Gaines, male   DOB: 85 y.o.   MRN: [id number]   HPI 85 year old male presents the office today with his daughter for concerns of a right hallux toenail deformity.  He states the nail is starting to come off and at the tip of the toe he notices a almost pink discoloration.  Denies any pain to the area denies any other areas of redness or red streaks and denies any drainage or pus.  The left side started same thing.  No recent injury.  He has no other concerns today.  He is on Plavix   Review of Systems  All other systems reviewed and are negative.  Past Medical History:  Diagnosis Date  . A-fib (HCC)   . Arthritis   . GERD (gastroesophageal reflux disease)   . Heart murmur   . HLD (hyperlipidemia)   . Seizures (HCC)    last sz 04/17/17  . Stroke [facility])    tia    Past Surgical History:  Procedure Laterality Date  . CARPAL TUNNEL RELEASE     About 10 yr ago (08/13/16)  . LOOP RECORDER INSERTION N/A 09/16/2016   Procedure: Loop Recorder Insertion;  Surgeon: Hillis Range, MD;  Location: [facility] INVAS[facility];  Service: Cardiovascular;  Laterality: N/A;  . TONSILLECTOMY       Current Outpatient Medications:  .  acetaminophen (TYLENOL) 500 MG tablet, Take 1,000 mg by mouth every 6 (six) hours as needed., Disp: , Rfl:  .  aspirin EC 325 MG tablet, Take 325 mg by mouth daily., Disp: , Rfl:  .  Cholecalciferol 1000 units capsule, Take 4,000 Units by mouth daily. , Disp: , Rfl:  .  clopidogrel (PLAVIX) 75 MG tablet, Take 75 mg by mouth daily. , Disp: , Rfl: 0 .  divalproex (DEPAKOTE ER) 250 MG 24 hr tablet, Take 1 tablet (250 mg total) by mouth daily. Take with your 500mg  pill for a total of 750mg  daily, Disp: 30 tablet, Rfl: 11 .  divalproex (DEPAKOTE ER) 500 MG 24 hr tablet, TAKE 1 TABLET BY MOUTH EVERY DAY. TAKE WITH 250MG  TABLET FOR TOTAL OF 750MG , Disp: 90 tablet, Rfl: 1 .  ferrous sulfate 325 (65 FE) MG EC tablet, Take 325 mg by mouth daily with  breakfast., Disp: , Rfl:  .  ibuprofen (ADVIL,MOTRIN) 200 MG tablet, Take 600 mg by mouth daily as needed for headache or moderate pain. , Disp: , Rfl:  .  LYCOPENE PO, Take 10 mg by mouth daily. , Disp: , Rfl:  .  Multiple Vitamins-Minerals (ICAPS) CAPS, Take 1 capsule by mouth daily., Disp: , Rfl:  .  Omega-3 Fatty Acids (FISH OIL) 1000 MG CAPS, Take 2,000 mg by mouth daily., Disp: , Rfl:  .  ranitidine (ZANTAC) 150 MG tablet, Take 300 mg by mouth daily. , Disp: , Rfl: 0 .  Saw Palmetto 450 MG CAPS, Take 900 mg by mouth daily. , Disp: , Rfl:  .  vitamin B-12 (CYANOCOBALAMIN) 1000 MCG tablet, Take 1,000 mcg by mouth daily., Disp: , Rfl:  .  ZETIA 10 MG tablet, Take 10 mg by mouth daily. , Disp: , Rfl: 0  Allergies  Allergen Reactions  . Rocephin [Ceftriaxone Sodium In Dextrose] Other (See Comments)    C-Diff  . Atorvastatin     Other reaction(s): myalgia  . Pravastatin     Other reaction(s): myalgia  . Rosuvastatin Calcium     Other reaction(s): fatigued  Objective:  Physical Exam  General: AAO x3, NAD  Dermatological: Bilateral hallux toenails are mildly hypertrophic, dystrophic with discoloration.  The nails somewhat loose on the right side of the distal aspect from adhered on the proximal aspect.  There is a very minimal faint erythema to the distal hallux on the right side and this appears to be more from irritation from shoes.  There is no surrounding erythema to the toenail site there is no ascending cellulitis.  No fluctuation crepitation any malodor.  No open lesions.  Vascular: Dorsalis Pedis artery and Posterior Tibial artery pedal pulses are 2/4 bilateral with immedate capillary fill time.There is no pain with calf compression, swelling, warmth, erythema.   Neruologic: Grossly intact via light touch bilateral.   Musculoskeletal: Bulbous area present to the toe just proximal to the nail but will gallop.  Muscular strength 5/5 in all groups tested bilateral.      Assessment:   Onychomycosis, onycholysis     Plan:  -Treatment options discussed including all alternatives, risks, and complications -Etiology of symptoms were discussed -X-rays obtained reviewed.  There is cystic changes present in the toe consistent with prior gout. -Patient debrided toenails today's bilateral hallux leading complications or bleeding to remove the loose portion of the nail.  Dispensed offloading pads.  Monitor for any signs or symptoms of infection.  Vivi Barrack DPM

## 2018-10-12 ENCOUNTER — Ambulatory Visit (INDEPENDENT_AMBULATORY_CARE_PROVIDER_SITE_OTHER): Payer: Medicare Other | Admitting: *Deleted

## 2018-10-12 DIAGNOSIS — R002 Palpitations: Secondary | ICD-10-CM | POA: Diagnosis not present

## 2018-10-13 LAB — CUP PACEART REMOTE DEVICE CHECK
Implantable Pulse Generator Implant Date: 20180208
MDC IDC SESS DTM: 20200306010953

## 2018-10-23 NOTE — Progress Notes (Signed)
Carelink Summary Report / Loop Recorder 

## 2018-11-08 ENCOUNTER — Other Ambulatory Visit: Payer: Self-pay | Admitting: Neurology

## 2018-11-14 ENCOUNTER — Other Ambulatory Visit: Payer: Self-pay

## 2018-11-14 ENCOUNTER — Ambulatory Visit (INDEPENDENT_AMBULATORY_CARE_PROVIDER_SITE_OTHER): Payer: Medicare Other | Admitting: *Deleted

## 2018-11-14 DIAGNOSIS — I639 Cerebral infarction, unspecified: Secondary | ICD-10-CM | POA: Diagnosis not present

## 2018-11-14 DIAGNOSIS — I6930 Unspecified sequelae of cerebral infarction: Secondary | ICD-10-CM | POA: Diagnosis not present

## 2018-11-14 DIAGNOSIS — K219 Gastro-esophageal reflux disease without esophagitis: Secondary | ICD-10-CM | POA: Diagnosis not present

## 2018-11-14 DIAGNOSIS — R7303 Prediabetes: Secondary | ICD-10-CM | POA: Diagnosis not present

## 2018-11-14 DIAGNOSIS — E782 Mixed hyperlipidemia: Secondary | ICD-10-CM | POA: Diagnosis not present

## 2018-11-14 DIAGNOSIS — R002 Palpitations: Secondary | ICD-10-CM

## 2018-11-15 LAB — CUP PACEART REMOTE DEVICE CHECK
Date Time Interrogation Session: 20200408013624
Implantable Pulse Generator Implant Date: 20180208

## 2018-11-22 NOTE — Progress Notes (Signed)
Carelink Summary Report / Loop Recorder 

## 2018-12-18 ENCOUNTER — Other Ambulatory Visit: Payer: Self-pay

## 2018-12-18 ENCOUNTER — Ambulatory Visit (INDEPENDENT_AMBULATORY_CARE_PROVIDER_SITE_OTHER): Payer: Medicare Other | Admitting: *Deleted

## 2018-12-18 DIAGNOSIS — R002 Palpitations: Secondary | ICD-10-CM

## 2018-12-18 DIAGNOSIS — I639 Cerebral infarction, unspecified: Secondary | ICD-10-CM | POA: Diagnosis not present

## 2018-12-18 LAB — CUP PACEART REMOTE DEVICE CHECK
Date Time Interrogation Session: 20200511014044
Implantable Pulse Generator Implant Date: 20180208

## 2019-01-02 NOTE — Progress Notes (Signed)
Carelink Summary Report / Loop Recorder 

## 2019-01-19 ENCOUNTER — Ambulatory Visit (INDEPENDENT_AMBULATORY_CARE_PROVIDER_SITE_OTHER): Payer: Medicare Other | Admitting: *Deleted

## 2019-01-19 DIAGNOSIS — I639 Cerebral infarction, unspecified: Secondary | ICD-10-CM | POA: Diagnosis not present

## 2019-01-22 LAB — CUP PACEART REMOTE DEVICE CHECK
Date Time Interrogation Session: 20200613021153
Implantable Pulse Generator Implant Date: 20180208

## 2019-01-23 NOTE — Progress Notes (Signed)
Carelink Summary Report / Loop Recorder 

## 2019-02-21 ENCOUNTER — Ambulatory Visit (INDEPENDENT_AMBULATORY_CARE_PROVIDER_SITE_OTHER): Payer: Medicare Other | Admitting: *Deleted

## 2019-02-21 DIAGNOSIS — I639 Cerebral infarction, unspecified: Secondary | ICD-10-CM

## 2019-02-22 LAB — CUP PACEART REMOTE DEVICE CHECK
Date Time Interrogation Session: 20200716024133
Implantable Pulse Generator Implant Date: 20180208

## 2019-03-03 NOTE — Progress Notes (Signed)
Carelink Summary Report / Loop Recorder 

## 2019-03-26 ENCOUNTER — Ambulatory Visit (INDEPENDENT_AMBULATORY_CARE_PROVIDER_SITE_OTHER): Payer: Medicare Other | Admitting: *Deleted

## 2019-03-26 DIAGNOSIS — I639 Cerebral infarction, unspecified: Secondary | ICD-10-CM | POA: Diagnosis not present

## 2019-03-27 LAB — CUP PACEART REMOTE DEVICE CHECK
Date Time Interrogation Session: 20200818024131
Implantable Pulse Generator Implant Date: 20180208

## 2019-04-03 NOTE — Progress Notes (Signed)
Carelink Summary Report / Loop Recorder 

## 2019-04-30 ENCOUNTER — Ambulatory Visit (INDEPENDENT_AMBULATORY_CARE_PROVIDER_SITE_OTHER): Payer: Medicare Other | Admitting: *Deleted

## 2019-04-30 DIAGNOSIS — I639 Cerebral infarction, unspecified: Secondary | ICD-10-CM | POA: Diagnosis not present

## 2019-04-30 LAB — CUP PACEART REMOTE DEVICE CHECK
Date Time Interrogation Session: 20200920101106
Implantable Pulse Generator Implant Date: 20180208

## 2019-05-08 NOTE — Progress Notes (Signed)
Carelink Summary Report / Loop Recorder 

## 2019-05-14 ENCOUNTER — Other Ambulatory Visit: Payer: Self-pay | Admitting: Neurology

## 2019-05-14 NOTE — Telephone Encounter (Signed)
Pending appt 06/04/2019. Refilled x 1 month.

## 2019-05-28 DIAGNOSIS — E782 Mixed hyperlipidemia: Secondary | ICD-10-CM | POA: Diagnosis not present

## 2019-05-28 DIAGNOSIS — R634 Abnormal weight loss: Secondary | ICD-10-CM | POA: Diagnosis not present

## 2019-05-28 DIAGNOSIS — Z0000 Encounter for general adult medical examination without abnormal findings: Secondary | ICD-10-CM | POA: Diagnosis not present

## 2019-05-28 DIAGNOSIS — R7303 Prediabetes: Secondary | ICD-10-CM | POA: Diagnosis not present

## 2019-06-01 ENCOUNTER — Ambulatory Visit (INDEPENDENT_AMBULATORY_CARE_PROVIDER_SITE_OTHER): Payer: Medicare Other | Admitting: *Deleted

## 2019-06-01 DIAGNOSIS — I639 Cerebral infarction, unspecified: Secondary | ICD-10-CM

## 2019-06-03 LAB — CUP PACEART REMOTE DEVICE CHECK
Date Time Interrogation Session: 20201023101057
Implantable Pulse Generator Implant Date: 20180208

## 2019-06-04 ENCOUNTER — Encounter: Payer: Self-pay | Admitting: Family Medicine

## 2019-06-04 ENCOUNTER — Other Ambulatory Visit: Payer: Self-pay

## 2019-06-04 ENCOUNTER — Ambulatory Visit: Payer: Medicare Other | Admitting: Neurology

## 2019-06-04 ENCOUNTER — Ambulatory Visit: Payer: Medicare Other | Admitting: Family Medicine

## 2019-06-04 VITALS — BP 128/85 | HR 87 | Temp 97.4°F | Ht 64.0 in | Wt 145.6 lb

## 2019-06-04 DIAGNOSIS — E539 Vitamin B deficiency, unspecified: Secondary | ICD-10-CM

## 2019-06-04 DIAGNOSIS — G3184 Mild cognitive impairment, so stated: Secondary | ICD-10-CM | POA: Diagnosis not present

## 2019-06-04 DIAGNOSIS — R569 Unspecified convulsions: Secondary | ICD-10-CM

## 2019-06-04 DIAGNOSIS — Z820 Family history of epilepsy and other diseases of the nervous system: Secondary | ICD-10-CM | POA: Diagnosis not present

## 2019-06-04 DIAGNOSIS — E559 Vitamin D deficiency, unspecified: Secondary | ICD-10-CM

## 2019-06-04 DIAGNOSIS — R251 Tremor, unspecified: Secondary | ICD-10-CM

## 2019-06-04 DIAGNOSIS — R634 Abnormal weight loss: Secondary | ICD-10-CM

## 2019-06-04 DIAGNOSIS — I6930 Unspecified sequelae of cerebral infarction: Secondary | ICD-10-CM | POA: Diagnosis not present

## 2019-06-04 DIAGNOSIS — R2689 Other abnormalities of gait and mobility: Secondary | ICD-10-CM

## 2019-06-04 NOTE — Progress Notes (Addendum)
PATIENT: Nicholas Gaines DOB: 1929/04/09  REASON FOR VISIT: follow up HISTORY FROM: patient  Chief Complaint  Patient presents with   Follow-up    Room 1, daughter. Daughter states that he has gotten slower and more febal. Lost weight.     HISTORY OF PRESENT ILLNESS: Today 06/06/19 Nicholas Gaines is a 85 y.o. male here today for follow up for seizure, MCI and history of CVA. He continues Depakote  daily. He has not had any seizure like activity since last being seen. His daughter, Elita Quick, reports that he is slower and has had some weight fluctuations. He has lost about 8 pounds over the past year. He feels that he is not eating as much. He cooks breakfast. Usually eats a frozen dinner or sandwich for lunch and dinner. No trouble swallowing or speaking. Gait is shuffled. He leans forward when walking. No assistive device. No falls. No dizziness. He is sleeping more. He feels more tired than normal but tries to stay active. He has noticed more of a tremor but is uncertain if it is worse with activity. He has contributed this to Depakote side effect.  He lives alone. He does not drive. He goes to church with friends and neighbors. He tries to stay busy. He continues aspirin  and Plavix. Mother died with Parkinson's.    HISTORY: (copied from  note on 05/29/2018)  Interval history 05/29/2018: He had formal neurocognitive testing with Dr. Alinda Dooms and diagnosed Mild Cognitive Impairment. He feels well on the Depakote. He has daytime sleepiness and loose stools with the Depakote. He is on  a day. He has had 2 seizures in the last 6 months, they do not want to increase his Depakote. He does not drive. Discussed mood, he may have some adjustment disorder. His mood is "liveable" but he doesn't feel depressed or that he needs some help with mood.Here with his daughter, he is doing well, Depakote has some side effects but has been the best AED we have tried will continue with this. He goes to  church. Discussed formal neurocognitive testing: MCI.  Reviewed imaging with patient/daughter. I agree with below :  IMPRESSION: This MRI of the brain without contrast shows the following: 1. Moderate generalized cortical atrophy, more pronounced in the mesial temporal lobes. The extent of atrophy has progressed noticeably since the 06/22/2016 MRI. 2. Moderate chronic microvascular ischemic change, slightly progressed when compared to the 2017 MRI. 3. There are no acute findings.  IMPRESSION: This MRI of the cervical spine without contrast shows the following: 1. The spinal cord appears normal. There is no spinal stenosis 2. At C2-C3, there is severe loss of disc height and uncovertebral spurring causing moderately severe bilateral foraminal narrowing that could lead to C3 nerve root compression to either side. 3. At C3-C4, there are degenerative changes causing moderately severe left foraminal narrowing with potential for left C4 nerve root compression. 4. At C5-C6, there are degenerative changes and severe reduced disc height causing moderately severe right foraminal narrowing with potential for right C6 nerve root compression. 5. There are moderate degenerative changes at the other cervical levels with less potential for nerve root compression  Interval history 11/16/2017:  Patient had diarrhea to Depakote and he was changed to  ER daily.  He is here with his daughter. Diarrhea went away. Doing well on the extended release. He has had 2 breakthrough seizures. He has been having more good days. Some days he feels like running, he is working in the  garden. His speech is a lot better. Has had 2 breakthrough seizures so will increase to 750mg  day. He feels his walking is better. No falls. He has been to physical therapy or balance. He has arthritis in his hips and he drags his feet. No neck pain, he has disk in his back that gives him problems. But he still has  imbalance. He still feels he has problems with memory. Daughter provides information. Daughter says he is doing better.   Interval history 02/16/2018: They are on vimpat twice a day 100mg . Having side effects from Vimpat. His balance is not as good as it was. He was fatigued with the Keppra but he has a decline with vimpat, reaction time is very slow, remembering how to work a cell phone. Daughter has to use manage his pills and his he has word-finding difficulty. They are concerned about quality of life. Decline din the last 12 months since the seizures. He fell three times when on Vimpat. He is not driving. He lives by himself bit he has first alert. Daughter lives 10 minutes away. His mother had dementia she passed away at 36. He has more short-term memory loss. Discussed risks of coming off of medication, seizures can cause significant morbidity and mortality. They still decline any more medications at this time. He has difficulty remembering names and words. We can take him off of the medication and then review again. Denies depression. Will decrease medication and then have him come back in 8 weeks for dementia evaluation. No appetite.   Reviewed CT scan of the head and agree with the following: FINDINGS: Brain: There is generalized age related parenchymal atrophy with commensurate dilatation of the ventricles and sulci. Chronic small vessel ischemic changes noted within the periventricular and subcortical white matter regions bilaterally. Additional small old lacunar infarcts noted within each basal ganglia region.  There is no mass, hemorrhage, edema or other evidence of acute parenchymal abnormality. No extra-axial hemorrhage.  Vascular: There are chronic calcified atherosclerotic changes of the large vessels at the skull base. No unexpected hyperdense vessel.  Skull: Normal. Negative for fracture or focal lesion.  Sinuses/Orbits: No acute finding.  Other: None.  IMPRESSION: 1. No  acute findings. No intracranial mass, hemorrhage or edema. 2. Atrophy and chronic ischemic changes as detailed above.  Cbc with anemia, Cmp with slightly dec GFR but largely unremarkable   KDX:IPJAS Thompsonis a 85 y.o.malehere as a referral from Dr. Martin Majestic seizures.Past medical history of atrial fibrillation, heart murmur, stroke in 2002, esophageal stricture,hyperlipidemia, TIA, prediabetic.Heis on aspirin 325 and Plavix 75. Patienthashad several episodes of staring, confusion, disorientation and unintelligible speech. Usually back to baseline within a half hour. No distinct recollection of the events and only slightly before and after them. No weakness or numbness or incoordination or gait disturbances. No headaches. Episodes happened when he was feeling well but also in the setting of fevers and cough and respiratory infection.The first episode was in November, he was fishing and he tried to talk and couldn't get his words out, lasted 15 minutes, he could talk when he got home. Daughter is here and provides much information, she saw him later that evening and it was fine. He denies any other associated symptoms such as weakness or confusion or vision changes. His friend in the car noticed that there was something wrong, no LOC, no urination or abnormal movements, he was in his usual state of health at the time no new medications or anything new but he  was starting to feel ill at the time. Next episode was on Christmas eve in the setting of illness, daughter was calling him and he did not answer and he was sitting in the car with his wet clothes on from vomiting and he was asleep in the car, he was weak, he was running a fever of 102 and he was not taken to the hospital. On Christmas day he didn't recognize family, he was speaking in "word salad" and he was aimlessly wandering around the house and looking into space and not saying anything, couldn;t tell his daughter's name. No  events since being discharged from the hospital.No current focal neurologic deficits, other associated symptoms or modifiable factors. He has not had any more incidents since starting AEDs, was started on trileptal in the hospital.  Reviewed notes, labs and imaging from outside physicians, which showed:  LDL 69, BUN 19, creatinine 1.1, A1c 5.6 08/03/2016. TSH 1.9 11/14/2015.  Patient presented to the emergency room on 07/03/2016 with fever, cough and episodic confusion. He said 2 episodes in the last several days where he had become discretely confused. Patient had a TIA in November diagnosed by primary care. He had been having on and off fevers, shortness of breath, cough and was recently started on antibiotics. Patient was unable to speak during his TIA, he remembered trying to speak and not being able to. He had a outpatient workup for TIA with his PCP that showed MRI with old stroke in 2002 and is getting loose recorder for paroxysmal A. fib. Episodes of confusion were described as disorientation and staring with associated difficulty speaking. Episodes lasted 30-60 minutes then resolved spontaneously. Patient did not have any weakness, numbness, vision or hearing loss. Episodes of happened in the setting of temperature and cough but also happen when patient was feeling well. On admission he was found to have elevated white blood cells 13, CT of the head was negative for acute intracranial abnormalities and neurology was consulted. Neurology felt that these were suggestive of complex partial seizures and not TIAs. Routine EEG was recommended for outpatient 72 hour ambulatory EEG.  Personally reviewed images and agree with the following: MRI brain 06/22/2016:1. No acute intracranial abnormality. 2. Mild chronic microvascular ischemic changes and parenchymal volume loss for age. Few scattered punctate foci of chronic microhemorrhage are nonspecific but also likely related to microvascular ischemic  changes.   REVIEW OF SYSTEMS: Out of a complete 14 system review of symptoms, the patient complains only of the following symptoms, gait changes, memory loss, tremor, weight loss, appetite change and all other reviewed systems are negative.  ALLERGIES: Allergies  Allergen Reactions   Rocephin [Ceftriaxone Sodium In Dextrose] Other (See Comments)    C-Diff   Atorvastatin     Other reaction(s): myalgia   Pravastatin     Other reaction(s): myalgia   Rosuvastatin Calcium     Other reaction(s): fatigued    HOME MEDICATIONS: Outpatient Medications Prior to Visit  Medication Sig Dispense Refill   acetaminophen (TYLENOL) 500 MG tablet Take 650 mg by mouth 2 (two) times daily.      aspirin EC 325 MG tablet Take 325 mg by mouth daily.     Cholecalciferol 1000 units capsule Take 4,000 Units by mouth daily.      clopidogrel (PLAVIX) 75 MG tablet Take 75 mg by mouth daily.   0   divalproex (DEPAKOTE ER) 250 MG 24 hr tablet TAKE 1 TABLET BY MOUTH EVERY DAY WITH  TABLET FOR A TOTAL  OF 750MG  30 tablet 1   divalproex (DEPAKOTE ER) 500 MG 24 hr tablet TAKE 1 TABLET BY MOUTH EVERY DAY WITH 250MG  TABLET FOR TOTAL OF 750MG  30 tablet 1   famotidine (PEPCID) 20 MG tablet      ferrous sulfate 325 (65 FE) MG EC tablet Take 325 mg by mouth daily with breakfast.     ibuprofen (ADVIL,MOTRIN) 200 MG tablet Take 600 mg by mouth daily as needed for headache or moderate pain.      LYCOPENE PO Take 10 mg by mouth daily.      Multiple Vitamins-Minerals (ICAPS) CAPS Take 1 capsule by mouth daily.     Omega-3 Fatty Acids (FISH OIL) 1000 MG CAPS Take 2,000 mg by mouth daily.     Saw Palmetto 450 MG CAPS Take 900 mg by mouth daily.      vitamin B-12 (CYANOCOBALAMIN) 1000 MCG tablet Take 1,000 mcg by mouth daily.     ZETIA 10 MG tablet Take 10 mg by mouth daily.   0   ranitidine (ZANTAC) 150 MG tablet Take 300 mg by mouth daily.   0   No facility-administered medications prior to visit.      PAST MEDICAL HISTORY: Past Medical History:  Diagnosis Date   A-fib [facility](HCC)    Arthritis    GERD (gastroesophageal reflux disease)    Heart murmur    HLD (hyperlipidemia)    Seizures (HCC)    last sz 04/17/17   Stroke (HCC)    tia    PAST SURGICAL HISTORY: Past Surgical History:  Procedure Laterality Date   CARPAL TUNNEL RELEASE     About 10 yr ago (08/13/16)   LOOP RECORDER INSERTION N/A 09/16/2016   Procedure: Loop Recorder Insertion;  Surgeon: Hillis RangeJames Allred, MD;  Location: [facility] INVASIVE CV LAB;  Service: Cardiovascular;  Laterality: N/A;   TONSILLECTOMY      FAMILY HISTORY: Family History  Problem Relation Age of Onset   Hypertension Mother    Parkinson's disease Mother    Heart attack Mother    Stroke Sister    Lymphoma Sister    Stroke Brother    CVA Father    Stroke Paternal Grandmother        in her early 5770's   Stroke Paternal Grandfather        in his early 970's   Pulmonary embolism Sister        04/2018   Diabetes Neg Hx     SOCIAL HISTORY: Social History   Socioeconomic History   Marital status: Widowed    Spouse name: Not on file   Number of children: 1   Years of education: 6312   Highest education level: Not on file  Occupational History   Occupation: Retired  Ecologistocial Needs   Financial resource strain: Not on file   Food insecurity    Worry: Not on file    Inability: Not on Occupational hygienistfile   Transportation needs    Medical: Not on file    Non-medical: Not on file  Tobacco Use   Smoking status: Never Smoker   Smokeless tobacco: Never Used  Substance and Sexual Activity   Alcohol use: No    Alcohol/week: 0.0 standard drinks   Drug use: No   Sexual activity: Not on file  Lifestyle   Physical activity    Days per week: Not on file    Minutes per session: Not on file   Stress: Not on file  Relationships   Social connections  Talks on phone: Not on file    Gets together: Not on file    Attends religious service:  Not on file    Active member of club or organization: Not on file    Attends meetings of clubs or organizations: Not on file    Relationship status: Not on file   Intimate partner violence    Fear of current or ex partner: Not on file    Emotionally abused: Not on file    Physically abused: Not on file    Forced sexual activity: Not on file  Other Topics Concern   Not on file  Social History Narrative   04/19/17 Lives alone    Caffeine use: cup of coffee daily   Right handed      PHYSICAL EXAM  Vitals:   06/04/19 1343  BP: 128/85  Pulse: 87  Temp: (!) 97.4 F (36.3 C)  Weight: 145 lb 9.6 oz (66 kg)  Height: 5\' 4"  (1.626 m)   Body mass index is 24.99 kg/m.  Generalized: Well developed, in no acute distress, flat affect noted   Cardiology: normal rate and rhythm, no murmur noted Neurological examination  Mentation: Alert oriented to time, place, history taking. Unable to perform serial subtractions or complete clock drawing. Shaky hand writting.Follows all commands speech and language fluent Cranial nerve II-XII: Pupils were equal round reactive to light. Extraocular movements were full, visual field were full on confrontational test. Facial sensation and strength were normal. Uvula tongue midline. Head turning and shoulder shrug  were normal and symmetric. Motor: The motor testing reveals 5 over 5 strength of all 4 extremities. Good symmetric motor tone is noted throughout. Very slight bradykinesia noted with finger taps, dysrhythmic toe taps Sensory: Sensory testing is intact to soft touch on all 4 extremities. No evidence of extinction is noted.  Coordination: Cerebellar testing reveals good finger-nose-finger and heel-to-shin bilaterally.  Gait and station: Gait is short and shuffled. Stooped posture. Tandem gait not attempted. Romberg is negative. No drift is seen.  Reflexes: Deep tendon reflexes are symmetric and normal bilaterally.   DIAGNOSTIC DATA (LABS, IMAGING,  TESTING) - I reviewed patient records, labs, notes, testing and imaging myself where available.  MMSE - Mini Mental State Exam 06/04/2019 11/16/2017  Orientation to time 5 5  Orientation to Place 5 3  Registration 3 3  Attention/ Calculation 1 3  Recall 2 2  Language- name 2 objects 2 2  Language- repeat 0 1  Language- follow 3 step command 3 3  Language- read & follow direction 1 1  Write a sentence 1 1  Copy design 0 0  Copy design-comments Named 10 animals -  Total score 23 24     Lab Results  Component Value Date   WBC 7.0 06/04/2019   HGB 10.2 (L) 06/04/2019   HCT 31.0 (L) 06/04/2019   MCV 95 06/04/2019   PLT 170 06/04/2019      Component Value Date/Time   NA 147 (H) 06/04/2019 1458   K 4.8 06/04/2019 1458   CL 107 (H) 06/04/2019 1458   CO2 26 06/04/2019 1458   GLUCOSE 103 (H) 06/04/2019 1458   GLUCOSE 105 (H) 09/09/2016 1108   BUN 21 06/04/2019 1458   CREATININE 1.21 06/04/2019 1458   CALCIUM 9.4 06/04/2019 1458   PROT 6.5 06/04/2019 1458   ALBUMIN 4.4 06/04/2019 1458   AST 13 06/04/2019 1458   ALT 5 06/04/2019 1458   ALKPHOS 127 (H) 06/04/2019 1458   BILITOT <  0.2 06/04/2019 1458   GFRNONAA 53 (L) 06/04/2019 1458   GFRAA 61 06/04/2019 1458   Lab Results  Component Value Date   CHOL 120 08/03/2016   HDL 37 (L) 08/03/2016   LDLCALC 69 08/03/2016   TRIG 71 08/03/2016   CHOLHDL 3.2 08/03/2016   Lab Results  Component Value Date   HGBA1C 5.6 08/03/2016   Lab Results  Component Value Date   VITAMINB12 >2000 (H) 06/04/2019   Lab Results  Component Value Date   TSH 3.200 06/04/2019     ASSESSMENT AND PLAN 85 y.o. year old male  has a past medical history of A-fib (HCC), Arthritis, GERD (gastroesophageal reflux disease), Heart murmur, HLD (hyperlipidemia), Seizures (HCC), and Stroke (HCC). here with     ICD-10-CM   1. Seizures (HCC)  R56.9 CBC with Differential/Platelets    CMP    Valproic acid level  2. Family history of Parkinson disease   Z82.0 NM BRAIN DATSCAN TUMOR LOC INFLAM SPECT 1 DAY  3. Mild cognitive impairment  G31.84 Vitamin B12    NM BRAIN DATSCAN TUMOR LOC INFLAM SPECT 1 DAY  4. H/O: stroke with residual effects  I69.30   5. Weight loss  R63.4 CBC with Differential/Platelets    CMP    TSH    NM BRAIN DATSCAN TUMOR LOC INFLAM SPECT 1 DAY  6. Vitamin B deficiency  E53.9 Vitamin B12  7. Vitamin D deficiency  E55.9 Vitamin D, 25-hydroxy  8. Shuffling gait  R26.89 NM BRAIN DATSCAN TUMOR LOC INFLAM SPECT 1 DAY  9. Tremor  R25.1 NM BRAIN DATSCAN TUMOR LOC INFLAM SPECT 1 DAY    Hessie Diener continues to be seizure-free on Depakote 750 mg daily.  We will continue Depakote as prescribed.  I am concerned about worsening fatigue, weight loss, and increased tremor.  Physical exam is concerning for parkinsonism.  His mother died with Parkinson's disease.  After discussion with Dr. Daisy Blossom, I have discussed ordering a DaTscan with Mr. Noorani and his daughter, Elita Quick.  Agreement to complete testing.  We may also consider repeat cognitive testing in the future, however, I feel that memory is fairly stable at this time.  He is completely alert and oriented to time and place.  He is able to provide most of the HPI.  I will update labs today and check for any reversible causes of fatigue.  We will adjust treatment plan pending DaTscan if appropriate.  We have discussed Sinemet in the office.  Patient is willing to try this if indicated.  Fall precautions discussed.  Physical therapy referral offered but patient declines at this time.  Memory compensation strategies reviewed.  He will follow-up with me in 3 months, sooner if needed.  He verbalizes understanding and agreement with this plan.   Orders Placed This Encounter  Procedures   NM BRAIN DATSCAN TUMOR LOC INFLAM SPECT 1 DAY    Standing Status:   Future    Standing Expiration Date:   08/05/2020    Order Specific Question:   If indicated for the ordered procedure, I authorize the  administration of a radiopharmaceutical per Radiology protocol    Answer:   Yes    Order Specific Question:   Preferred imaging location?    Answer:   [facility]    Order Specific Question:   Radiology Contrast Protocol - do NOT remove file path    Answer:   [email or web address]   CBC with Differential/Platelets   Vitamin B12   CMP  Vitamin D, 25-hydroxy   TSH   Valproic acid level     No orders of the defined types were placed in this encounter.     I spent 60 minutes with the patient. 50% of this time was spent counseling and educating patient on plan of care and medications.    Shawnie Dapper, FNP-C 06/06/2019, 11:08 AM [facility] [address] [place], [place] [address] [phone]  Made any corrections needed, and agree with history, physical, neuro exam,assessment and plan as stated.     Naomie Dean, MD [facility]

## 2019-06-04 NOTE — Patient Instructions (Addendum)
Continue Depakote 750mg  daily  Follow up in 3 months, sooner if needed   Memory Compensation Strategies  1. Use "WARM" strategy.  W= write it down  A= associate it  R= repeat it  M= make a mental note  2.   You can keep a .  Use a 3-ring notebook with sections for the following: calendar, important names and phone numbers,  medications, doctors' names/phone numbers, lists/reminders, and a section to journal what you did  each day.   3.    Use a calendar to write appointments down.  4.    Write yourself a schedule for the day.  This can be placed on the calendar or in a separate section of the Memory Notebook.  Keeping a  regular schedule can help memory.  5.    Use medication organizer with sections for each day or morning/evening pills.  You may need help loading it  6.    Keep a basket, or pegboard by the door.  Place items that you need to take out with you in the basket or on the pegboard.  You may also want to  include a message board for reminders.  7.    Use sticky notes.  Place sticky notes with reminders in a place where the task is performed.  For example: " turn off the  stove" placed by the stove, "lock the door" placed on the door at eye level, " take your medications" on  the bathroom mirror or by the place where you normally take your medications.  8.    Use alarms/timers.  Use while cooking to remind yourself to check on food or as a reminder to take your medicine, or as a  reminder to make a call, or as a reminder to perform another task, etc.   Seizure, Adult A seizure is a sudden burst of abnormal electrical activity in the brain. Seizures usually last from 30 seconds to 2 minutes. They can cause many different symptoms. Usually, seizures are not harmful unless they last a long time. What are the causes? Common causes of this condition include:  Fever or infection.  Conditions that affect the brain, such as: ? A brain abnormality that you  were born with. ? A brain or head injury. ? Bleeding in the brain. ? A tumor. ? Stroke. ? Brain disorders such as autism or cerebral palsy.  Low blood sugar.  Conditions that are passed from parent to child (are inherited).  Problems with substances, such as: ? Having a reaction to a drug or a medicine. ? Suddenly stopping the use of a substance (withdrawal). In some cases, the cause may not be known. A person who has repeated seizures over time without a clear cause has a condition called epilepsy. What increases the risk? You are more likely to get this condition if you have:  A family history of epilepsy.  Had a seizure in the past.  A brain disorder.  A history of head injury, lack of oxygen at birth, or strokes. What are the signs or symptoms? There are many types of seizures. The symptoms vary depending on the type of seizure you have. Examples of symptoms during a seizure include:  Shaking (convulsions).  Stiffness in the body.  Passing out (losing consciousness).  Head nodding.  Staring.  Not responding to sound or touch.  Loss of bladder control and bowel control. Some people have symptoms right before and right after a seizure happens. Symptoms before a  seizure may include:  Fear.  Worry (anxiety).  Feeling like you may vomit (nauseous).  Feeling like the room is spinning (vertigo).  Feeling like you saw or heard something before (dj vu).  Odd tastes or smells.  Changes in how you see. You may see flashing lights or spots. Symptoms after a seizure happens can include:  Confusion.  Sleepiness.  Headache.  Weakness on one side of the body. How is this treated? Most seizures will stop on their own in under 5 minutes. In these cases, no treatment is needed. Seizures that last longer than 5 minutes will usually need treatment. Treatment can include:  Medicines given through an IV tube.  Avoiding things that are known to cause your  seizures. These can include medicines that you take for another condition.  Medicines to treat epilepsy.  Surgery to stop the seizures. This may be needed if medicines do not help. Follow these instructions at home: Medicines  Take over-the-counter and prescription medicines only as told by your doctor.  Do not eat or drink anything that may keep your medicine from working, such as alcohol. Activity  Do not do any activities that would be dangerous if you had another seizure, like driving or swimming. Wait until your doctor says it is safe for you to do them.  If you live in the [place] ask your local DMV (department of motor vehicles) when you can drive.  Get plenty of rest. Teaching others Teach friends and family what to do when you have a seizure. They should:  Lay you on the ground.  Protect your head and body.  Loosen any tight clothing around your neck.  Turn you on your side.  Not hold you down.  Not put anything into your mouth.  Know whether or not you need emergency care.  Stay with you until you are better.  General instructions  Contact your doctor each time you have a seizure.  Avoid anything that gives you seizures.  Keep a seizure diary. Write down: ? What you think caused each seizure. ? What you remember about each seizure.  Keep all follow-up visits as told by your doctor. This is important. Contact a doctor if:  You have another seizure.  You have seizures more often.  There is any change in what happens during your seizures.  You keep having seizures with treatment.  You have symptoms of being sick or having an infection. Get help right away if:  You have a seizure that: ? Lasts longer than 5 minutes. ? Is different than seizures you had before. ? Makes it harder to breathe. ? Happens after you hurt your head.  You have any of these symptoms after a seizure: ? Not being able to speak. ? Not being able to use a part of your body.  ? Confusion. ? A bad headache.  You have two or more seizures in a row.  You do not wake up right after a seizure.  You get hurt during a seizure. These symptoms may be an emergency. Do not wait to see if the symptoms will go away. Get medical help right away. Call your local emergency services (911 in the [place] Do not drive yourself to the hospital. Summary  Seizures usually last from 30 seconds to 2 minutes. Usually, they are not harmful unless they last a long time.  Do not eat or drink anything that may keep your medicine from working, such as alcohol.  Teach friends and family what  to do when you have a seizure.  Contact your doctor each time you have a seizure. This information is not intended to replace advice given to you by your health care provider. Make sure you discuss any questions you have with your health care provider. Document Released: 01/12/2008 Document Revised: 10/13/2018 Document Reviewed: 10/13/2018 Elsevier Patient Education  2020 [employer]   Stroke Prevention Some medical conditions and lifestyle choices can lead to a higher risk for a stroke. You can help to prevent a stroke by making nutrition, lifestyle, and other changes. What nutrition changes can be made?   Eat healthy foods. ? Choose foods that are high in fiber. These include:  Fresh fruits.  Fresh vegetables.  Whole grains. ? Eat at least 5 or more servings of fruits and vegetables each day. Try to fill half of your plate at each meal with fruits and vegetables. ? Choose lean protein foods. These include:  Lowfat (lean) cuts of meat.  Chicken without skin.  Fish.  Tofu.  Beans.  Nuts. ? Eat low-fat dairy products. ? Avoid foods that:  Are high in salt (sodium).  Have saturated fat.  Have trans fat.  Have cholesterol.  Are processed.  Are premade.  Follow eating guidelines as told by your doctor. These may include: ? Reducing how many calories you eat and drink  each day. ? Limiting how much salt you eat or drink each day to 1,500 milligrams (mg). ? Using only healthy fats for cooking. These include:  Olive oil.  Canola oil.  Sunflower oil. ? Counting how many carbohydrates you eat and drink each day. What lifestyle changes can be made?  Try to stay at a healthy weight. Talk to your doctor about what a good weight is for you.  Get at least 30 minutes of moderate physical activity at least 5 days a week. This can include: ? Fast walking. ? Biking. ? Swimming.  Do not use any products that have nicotine or tobacco. This includes cigarettes and e-cigarettes. If you need help quitting, ask your doctor. Avoid being around tobacco smoke in general.  Limit how much alcohol you drink to no more than 1 drink a day for nonpregnant women and 2 drinks a day for men. One drink equals 12 oz of beer, 5 oz of wine, or 1 oz of hard liquor.  Do not use drugs.  Avoid taking birth control pills. Talk to your doctor about the risks of taking birth control pills if: ? You are over 83 years old. ? You smoke. ? You get migraines. ? You have had a blood clot. What other changes can be made?  Manage your cholesterol. ? It is important to eat a healthy diet. ? If your cholesterol cannot be managed through your diet, you may also need to take medicines. Take medicines as told by your doctor.  Manage your diabetes. ? It is important to eat a healthy diet and to exercise regularly. ? If your blood sugar cannot be managed through diet and exercise, you may need to take medicines. Take medicines as told by your doctor.  Control your high blood pressure (hypertension). ? Try to keep your blood pressure below 130/80. This can help lower your risk of stroke. ? It is important to eat a healthy diet and to exercise regularly. ? If your blood pressure cannot be managed through diet and exercise, you may need to take medicines. Take medicines as told by your doctor. ?  Ask your doctor  if you should check your blood pressure at home. ? Have your blood pressure checked every year. Do this even if your blood pressure is normal.  Talk to your doctor about getting checked for a sleep disorder. Signs of this can include: ? Snoring a lot. ? Feeling very tired.  Take over-the-counter and prescription medicines only as told by your doctor. These may include aspirin or blood thinners (antiplatelets or anticoagulants).  Make sure that any other medical conditions you have are managed. Where to find more information  American Stroke Association: www.strokeassociation.org  National Stroke Association: www.stroke.org Get help right away if:  You have any symptoms of stroke. "BE FAST" is an easy way to remember the main warning signs: ? B - Balance. Signs are dizziness, sudden trouble walking, or loss of balance. ? E - Eyes. Signs are trouble seeing or a sudden change in how you see. ? F - Face. Signs are sudden weakness or loss of feeling of the face, or the face or eyelid drooping on one side. ? A - Arms. Signs are weakness or loss of feeling in an arm. This happens suddenly and usually on one side of the body. ? S - Speech. Signs are sudden trouble speaking, slurred speech, or trouble understanding what people say. ? T - Time. Time to call emergency services. Write down what time symptoms started.  You have other signs of stroke, such as: ? A sudden, very bad headache with no known cause. ? Feeling sick to your stomach (nausea). ? Throwing up (vomiting). ? Jerky movements you cannot control (seizure). These symptoms may represent a serious problem that is an emergency. Do not wait to see if the symptoms will go away. Get medical help right away. Call your local emergency services (911 in the [place] Do not drive yourself to the hospital. Summary  You can prevent a stroke by eating healthy, exercising, not smoking, drinking less alcohol, and treating other  health problems, such as diabetes, high blood pressure, or high cholesterol.  Do not use any products that contain nicotine or tobacco, such as cigarettes and e-cigarettes.  Get help right away if you have any signs or symptoms of a stroke. This information is not intended to replace advice given to you by your health care provider. Make sure you discuss any questions you have with your health care provider. Document Released: 01/25/2012 Document Revised: 09/21/2018 Document Reviewed: 10/27/2016 Elsevier Patient Education  2020 [employer].

## 2019-06-05 LAB — COMPREHENSIVE METABOLIC PANEL
ALT: 5 IU/L (ref 0–44)
AST: 13 IU/L (ref 0–40)
Albumin/Globulin Ratio: 2.1 (ref 1.2–2.2)
Albumin: 4.4 g/dL (ref 3.6–4.6)
Alkaline Phosphatase: 127 IU/L — ABNORMAL HIGH (ref 39–117)
BUN/Creatinine Ratio: 17 (ref 10–24)
BUN: 21 mg/dL (ref 8–27)
Bilirubin Total: <0.2 mg/dL (ref 0.0–1.2)
CO2: 26 mmol/L (ref 20–29)
Calcium: 9.4 mg/dL (ref 8.6–10.2)
Chloride: 107 mmol/L — ABNORMAL HIGH (ref 96–106)
Creatinine, Ser: 1.21 mg/dL (ref 0.76–1.27)
GFR calc Af Amer: 61 mL/min/{1.73_m2} (ref >59)
GFR calc non Af Amer: 53 mL/min/{1.73_m2} — ABNORMAL LOW (ref >59)
Globulin, Total: 2.1 g/dL (ref 1.5–4.5)
Glucose: 103 mg/dL — ABNORMAL HIGH (ref 65–99)
Potassium: 4.8 mmol/L (ref 3.5–5.2)
Sodium: 147 mmol/L — ABNORMAL HIGH (ref 134–144)
Total Protein: 6.5 g/dL (ref 6.0–8.5)

## 2019-06-05 LAB — CBC WITH DIFFERENTIAL/PLATELET
Basophils Absolute: 0 10*3/uL (ref 0.0–0.2)
Basos: 0 %
EOS (ABSOLUTE): 0.2 10*3/uL (ref 0.0–0.4)
Eos: 2 %
Hematocrit: 31 % — ABNORMAL LOW (ref 37.5–51.0)
Hemoglobin: 10.2 g/dL — ABNORMAL LOW (ref 13.0–17.7)
Immature Grans (Abs): 0.1 10*3/uL (ref 0.0–0.1)
Immature Granulocytes: 1 %
Lymphocytes Absolute: 2 10*3/uL (ref 0.7–3.1)
Lymphs: 28 %
MCH: 31.2 pg (ref 26.6–33.0)
MCHC: 32.9 g/dL (ref 31.5–35.7)
MCV: 95 fL (ref 79–97)
Monocytes Absolute: 0.7 10*3/uL (ref 0.1–0.9)
Monocytes: 10 %
Neutrophils Absolute: 4.1 10*3/uL (ref 1.4–7.0)
Neutrophils: 59 %
Platelets: 170 10*3/uL (ref 150–450)
RBC: 3.27 x10E6/uL — ABNORMAL LOW (ref 4.14–5.80)
RDW: 12.4 % (ref 11.6–15.4)
WBC: 7 10*3/uL (ref 3.4–10.8)

## 2019-06-05 LAB — VITAMIN D 25 HYDROXY (VIT D DEFICIENCY, FRACTURES): Vit D, 25-Hydroxy: 50.1 ng/mL (ref 30.0–100.0)

## 2019-06-05 LAB — VALPROIC ACID LEVEL: Valproic Acid Lvl: 43 ug/mL — ABNORMAL LOW (ref 50–100)

## 2019-06-05 LAB — TSH: TSH: 3.2 u[IU]/mL (ref 0.450–4.500)

## 2019-06-05 LAB — VITAMIN B12: Vitamin B-12: >2000 pg/mL — ABNORMAL HIGH (ref 232–1245)

## 2019-06-06 ENCOUNTER — Telehealth: Payer: Self-pay | Admitting: Family Medicine

## 2019-06-06 ENCOUNTER — Encounter: Payer: Self-pay | Admitting: Family Medicine

## 2019-06-06 ENCOUNTER — Telehealth: Payer: Self-pay | Admitting: Neurology

## 2019-06-06 NOTE — Telephone Encounter (Signed)
-----   Message from Darleen Crocker, RN sent at 06/06/2019 12:47 PM EDT -----  ----- Message ----- From: Debbora Presto, NP Sent: 06/06/2019  12:45 PM EDT To: Brandon Melnick, RN  Labs look ok with a couple of exceptions. CBC remains abnormal. hemoglobin is 10.2. Was 11.5 last year. I need him to follow up closely with PCP to review these labs. Also, sodium is reading high. I think he should have this repeated to confirm. Either with [place] or PCP. Tyroid, vit d and B12 all look good. Depakote levels were a little low but stable from last year. He is not having any seizure activity so I think its ok to leave dose the same for now. We will monitor closely. I would like to continue plans for DaT scan if they are willing. We will need to get release signed.

## 2019-06-06 NOTE — Telephone Encounter (Signed)
Pt has called RN Lovey Newcomer back.  Pt states she has spoken with RN Myriam Jacobson so unless it were about something else there is no need to call

## 2019-06-06 NOTE — Telephone Encounter (Signed)
Called and LMVM for Olin Hauser, that returned call.

## 2019-06-06 NOTE — Telephone Encounter (Signed)
Called and spoke with daughter in regards to the lab work results. Daughter states the pt has a visit coming up next week. I have forwarded these lab results per the daughter request. They will follow up with the abnormal labs through PCP. She states when they follow up in 3 mths they will discuss the DAT scan further at that time. Informed he would need to continue to take meds as prescribed. She verbalized understanding and had no further questions.

## 2019-06-06 NOTE — Telephone Encounter (Signed)
Pt is asking for a call from [place] in response to the call she missed from NP Amy

## 2019-06-06 NOTE — Progress Notes (Signed)
Thank you for spending so much time with them.

## 2019-06-11 NOTE — Progress Notes (Signed)
Carelink Summary Report / Loop Recorder 

## 2019-06-13 DIAGNOSIS — E559 Vitamin D deficiency, unspecified: Secondary | ICD-10-CM | POA: Diagnosis not present

## 2019-06-13 DIAGNOSIS — R7303 Prediabetes: Secondary | ICD-10-CM | POA: Diagnosis not present

## 2019-06-13 DIAGNOSIS — E782 Mixed hyperlipidemia: Secondary | ICD-10-CM | POA: Diagnosis not present

## 2019-06-13 DIAGNOSIS — M1A0711 Idiopathic chronic gout, right ankle and foot, with tophus (tophi): Secondary | ICD-10-CM | POA: Diagnosis not present

## 2019-07-04 ENCOUNTER — Ambulatory Visit (INDEPENDENT_AMBULATORY_CARE_PROVIDER_SITE_OTHER): Payer: Medicare Other | Admitting: *Deleted

## 2019-07-04 DIAGNOSIS — G459 Transient cerebral ischemic attack, unspecified: Secondary | ICD-10-CM

## 2019-07-04 LAB — CUP PACEART REMOTE DEVICE CHECK
Date Time Interrogation Session: 20201125051250
Implantable Pulse Generator Implant Date: 20180208

## 2019-07-23 ENCOUNTER — Other Ambulatory Visit: Payer: Self-pay | Admitting: *Deleted

## 2019-07-23 MED ORDER — DIVALPROEX SODIUM ER 250 MG PO TB24: ORAL_TABLET | ORAL | 1 refills | Status: DC

## 2019-07-23 MED ORDER — DIVALPROEX SODIUM ER 500 MG PO TB24: ORAL_TABLET | ORAL | 1 refills | Status: DC

## 2019-07-31 NOTE — Progress Notes (Signed)
ILR Remote 

## 2019-08-05 DIAGNOSIS — Z20828 Contact with and (suspected) exposure to other viral communicable diseases: Secondary | ICD-10-CM | POA: Diagnosis not present

## 2019-08-06 ENCOUNTER — Ambulatory Visit (INDEPENDENT_AMBULATORY_CARE_PROVIDER_SITE_OTHER): Payer: Medicare Other | Admitting: *Deleted

## 2019-08-06 DIAGNOSIS — Z8673 Personal history of transient ischemic attack (TIA), and cerebral infarction without residual deficits: Secondary | ICD-10-CM

## 2019-08-06 LAB — CUP PACEART REMOTE DEVICE CHECK
Date Time Interrogation Session: 20201228051725
Implantable Pulse Generator Implant Date: 20180208

## 2019-08-07 NOTE — Progress Notes (Signed)
ILR remote 

## 2019-08-08 ENCOUNTER — Emergency Department (HOSPITAL_COMMUNITY): Payer: Medicare Other

## 2019-08-08 ENCOUNTER — Other Ambulatory Visit: Payer: Self-pay

## 2019-08-08 ENCOUNTER — Observation Stay (HOSPITAL_COMMUNITY)
Admission: EM | Admit: 2019-08-08 | Discharge: 2019-08-09 | Disposition: A | Discharge status: Home Health | Payer: Medicare Other | Attending: Internal Medicine | Admitting: Internal Medicine

## 2019-08-08 ENCOUNTER — Encounter (HOSPITAL_COMMUNITY): Payer: Self-pay | Admitting: Emergency Medicine

## 2019-08-08 DIAGNOSIS — G40909 Epilepsy, unspecified, not intractable, without status epilepticus: Secondary | ICD-10-CM | POA: Diagnosis not present

## 2019-08-08 DIAGNOSIS — E782 Mixed hyperlipidemia: Secondary | ICD-10-CM | POA: Insufficient documentation

## 2019-08-08 DIAGNOSIS — E860 Dehydration: Secondary | ICD-10-CM | POA: Diagnosis not present

## 2019-08-08 DIAGNOSIS — I6930 Unspecified sequelae of cerebral infarction: Secondary | ICD-10-CM

## 2019-08-08 DIAGNOSIS — I4891 Unspecified atrial fibrillation: Secondary | ICD-10-CM | POA: Diagnosis not present

## 2019-08-08 DIAGNOSIS — Z888 Allergy status to other drugs, medicaments and biological substances status: Secondary | ICD-10-CM | POA: Insufficient documentation

## 2019-08-08 DIAGNOSIS — M1990 Unspecified osteoarthritis, unspecified site: Secondary | ICD-10-CM | POA: Insufficient documentation

## 2019-08-08 DIAGNOSIS — E785 Hyperlipidemia, unspecified: Secondary | ICD-10-CM | POA: Diagnosis present

## 2019-08-08 DIAGNOSIS — R531 Weakness: Secondary | ICD-10-CM | POA: Diagnosis not present

## 2019-08-08 DIAGNOSIS — R270 Ataxia, unspecified: Secondary | ICD-10-CM | POA: Diagnosis not present

## 2019-08-08 DIAGNOSIS — I69319 Unspecified symptoms and signs involving cognitive functions following cerebral infarction: Secondary | ICD-10-CM | POA: Diagnosis not present

## 2019-08-08 DIAGNOSIS — Z79899 Other long term (current) drug therapy: Secondary | ICD-10-CM | POA: Insufficient documentation

## 2019-08-08 DIAGNOSIS — Z881 Allergy status to other antibiotic agents status: Secondary | ICD-10-CM | POA: Insufficient documentation

## 2019-08-08 DIAGNOSIS — R070 Pain in throat: Secondary | ICD-10-CM | POA: Diagnosis not present

## 2019-08-08 DIAGNOSIS — Z7902 Long term (current) use of antithrombotics/antiplatelets: Secondary | ICD-10-CM | POA: Insufficient documentation

## 2019-08-08 DIAGNOSIS — G3184 Mild cognitive impairment, so stated: Secondary | ICD-10-CM | POA: Diagnosis present

## 2019-08-08 DIAGNOSIS — R0902 Hypoxemia: Secondary | ICD-10-CM | POA: Diagnosis not present

## 2019-08-08 DIAGNOSIS — E559 Vitamin D deficiency, unspecified: Secondary | ICD-10-CM | POA: Insufficient documentation

## 2019-08-08 DIAGNOSIS — R569 Unspecified convulsions: Secondary | ICD-10-CM

## 2019-08-08 DIAGNOSIS — I10 Essential (primary) hypertension: Secondary | ICD-10-CM | POA: Diagnosis not present

## 2019-08-08 DIAGNOSIS — Z20828 Contact with and (suspected) exposure to other viral communicable diseases: Secondary | ICD-10-CM | POA: Insufficient documentation

## 2019-08-08 DIAGNOSIS — Z7982 Long term (current) use of aspirin: Secondary | ICD-10-CM | POA: Diagnosis not present

## 2019-08-08 DIAGNOSIS — K219 Gastro-esophageal reflux disease without esophagitis: Secondary | ICD-10-CM | POA: Diagnosis not present

## 2019-08-08 DIAGNOSIS — G40209 Localization-related (focal) (partial) symptomatic epilepsy and epileptic syndromes with complex partial seizures, not intractable, without status epilepticus: Secondary | ICD-10-CM | POA: Diagnosis present

## 2019-08-08 LAB — URINALYSIS, ROUTINE W REFLEX MICROSCOPIC
Bacteria, UA: NONE SEEN
Bilirubin Urine: NEGATIVE
Glucose, UA: NEGATIVE mg/dL
Hgb urine dipstick: NEGATIVE
Ketones, ur: 5 mg/dL — AB
Leukocytes,Ua: NEGATIVE
Nitrite: NEGATIVE
Protein, ur: 30 mg/dL — AB
Specific Gravity, Urine: 1.017 (ref 1.005–1.030)
pH: 8 (ref 5.0–8.0)

## 2019-08-08 LAB — BASIC METABOLIC PANEL
Anion gap: 10 (ref 5–15)
BUN: 13 mg/dL (ref 8–23)
CO2: 27 mmol/L (ref 22–32)
Calcium: 9 mg/dL (ref 8.9–10.3)
Chloride: 104 mmol/L (ref 98–111)
Creatinine, Ser: 1.04 mg/dL (ref 0.61–1.24)
GFR calc Af Amer: >60 mL/min (ref >60)
GFR calc non Af Amer: >60 mL/min (ref >60)
Glucose, Bld: 126 mg/dL — ABNORMAL HIGH (ref 70–99)
Potassium: 4.2 mmol/L (ref 3.5–5.1)
Sodium: 141 mmol/L (ref 135–145)

## 2019-08-08 LAB — TSH: TSH: 2.931 u[IU]/mL (ref 0.350–4.500)

## 2019-08-08 LAB — CBC
HCT: 34 % — ABNORMAL LOW (ref 39.0–52.0)
Hemoglobin: 10.8 g/dL — ABNORMAL LOW (ref 13.0–17.0)
MCH: 31.8 pg (ref 26.0–34.0)
MCHC: 31.8 g/dL (ref 30.0–36.0)
MCV: 100 fL (ref 80.0–100.0)
Platelets: 187 10*3/uL (ref 150–400)
RBC: 3.4 MIL/uL — ABNORMAL LOW (ref 4.22–5.81)
RDW: 13.1 % (ref 11.5–15.5)
WBC: 11.8 10*3/uL — ABNORMAL HIGH (ref 4.0–10.5)
nRBC: 0 % (ref 0.0–0.2)

## 2019-08-08 LAB — VALPROIC ACID LEVEL: Valproic Acid Lvl: 17 ug/mL — ABNORMAL LOW (ref 50.0–100.0)

## 2019-08-08 LAB — TROPONIN I (HIGH SENSITIVITY): Troponin I (High Sensitivity): 10 ng/L (ref <18)

## 2019-08-08 MED ORDER — SODIUM CHLORIDE 0.9 % IV BOLUS
500.0000 mL | Freq: Once | INTRAVENOUS | Status: AC
  Administered 2019-08-08: 500 mL via INTRAVENOUS

## 2019-08-08 MED ORDER — DIVALPROEX SODIUM ER 500 MG PO TB24
750.0000 mg | ORAL_TABLET | Freq: Once | ORAL | Status: AC
  Administered 2019-08-08: 750 mg via ORAL
  Filled 2019-08-08: qty 1

## 2019-08-08 NOTE — ED Provider Notes (Signed)
[facility] EMERGENCY DEPARTMENT Provider Note   CSN: [id number] Arrival date & time: 08/08/19  1344     History Chief Complaint  Patient presents with  . Weakness    Nicholas Gaines is a 85 y.o. male.  Patient presents to the emergency department today with complaint of weakness.  Patient had a history of stroke 20 years ago without any deficits and has a history of seizure disorder for which he takes Depakote daily.  Patient first started complaining of a scratchy throat about 1 week ago.  Over the weekend, 3 days ago he was tested for coronavirus and this test came back negative today.  Today he was at home, he lives by his self but has family nearby.  He was unable to get out of his recliner and needed assistance.  When the daughter came to help him he had urinated on himself.  EMS was called for transport to the hospital.  No reported fevers, nausea, vomiting, or diarrhea.  Patient denies any chest pain.  He has an occasional cough.  No dysuria.  No leg swelling or shortness of breath.  Onset of symptoms acute.  Course is constant.  Daughter reports that he may have missed his seizure medicine last night when she checked his medicines today.        Past Medical History:  Diagnosis Date  . A-fib ([place])   . Arthritis   . GERD (gastroesophageal reflux disease)   . Heart murmur   . HLD (hyperlipidemia)   . Seizures ([place])    last sz 04/17/17  . Stroke [facility])    tia    Patient Active Problem List   Diagnosis Date Noted  . Seizures ([place]) 06/04/2019  . Complex partial seizure with impairment of consciousness at onset [facility]) 05/29/2018  . Mild cognitive impairment 05/29/2018  . Palpitations 09/16/2016  . Anemia 09/09/2016  . Aortic valve calcification 09/09/2016  . Aortic valve sclerosis 09/09/2016  . BPH (benign prostatic hyperplasia) 09/09/2016  . Cardiac murmur 09/09/2016  . Essential (primary) hypertension 09/09/2016  . Facial weakness status post  cerebrovascular accident 09/09/2016  . H/O: stroke with residual effects 09/09/2016  . History of stroke 09/09/2016  . Prediabetes 09/09/2016  . Vitamin D deficiency 09/09/2016  . Alteration of consciousness 08/14/2016  . Acute encephalopathy 08/02/2016  . Difficulty speaking 08/02/2016  . Bronchitis 08/02/2016  . Mixed hyperlipidemia 08/02/2016  . Stroke ([place])   . GERD (gastroesophageal reflux disease)   . Gastroesophageal reflux disease without esophagitis   . Transient cerebral ischemia     Past Surgical History:  Procedure Laterality Date  . CARPAL TUNNEL RELEASE     About 10 yr ago (08/13/16)  . LOOP RECORDER INSERTION N/A 09/16/2016   Procedure: Loop Recorder Insertion;  Surgeon: Proud Grayer, MD;  Location: [facility] CV LAB;  Service: Cardiovascular;  Laterality: N/A;  . TONSILLECTOMY         Family History  Problem Relation Age of Onset  . Hypertension Mother   . Parkinson's disease Mother   . Heart attack Mother   . Stroke Sister   . Lymphoma Sister   . Stroke Brother   . CVA Father   . Stroke Paternal Grandmother        in her early 7's  . Stroke Paternal Grandfather        in his early 21's  . Pulmonary embolism Sister        04/2018  . Diabetes Neg Hx  Social History   Tobacco Use  . Smoking status: Never Smoker  . Smokeless tobacco: Never Used  Substance Use Topics  . Alcohol use: No    Alcohol/week: 0.0 standard drinks  . Drug use: No    Home Medications Prior to Admission medications   Medication Sig Start Date End Date Taking? Authorizing Provider  acetaminophen (TYLENOL) 500 MG tablet Take 650 mg by mouth 2 (two) times daily.     [provider]  aspirin EC 325 MG tablet Take 325 mg by mouth daily.    [provider]  Cholecalciferol 1000 units capsule Take 4,000 Units by mouth daily.     [provider]  clopidogrel (PLAVIX) 75 MG tablet Take 75 mg by mouth daily.  05/22/15   [provider]    divalproex (DEPAKOTE ER) 250 MG 24 hr tablet TAKE 1 TABLET BY MOUTH EVERY DAY WITH 500MG  TABLET FOR A TOTAL OF 750MG  07/23/19   Lomax, Amy, NP  divalproex (DEPAKOTE ER) 500 MG 24 hr tablet TAKE 1 TABLET BY MOUTH EVERY DAY WITH 250MG  TABLET FOR TOTAL OF 750MG  07/23/19   Lomax, Amy, NP  famotidine (PEPCID) 20 MG tablet  05/28/19   [provider]  ferrous sulfate 325 (65 FE) MG EC tablet Take 325 mg by mouth daily with breakfast.    [provider]  ibuprofen (ADVIL,MOTRIN) 200 MG tablet Take 600 mg by mouth daily as needed for headache or moderate pain.     [provider]  LYCOPENE PO Take 10 mg by mouth daily.     [provider]  Multiple Vitamins-Minerals (ICAPS) CAPS Take 1 capsule by mouth daily.    [provider]  Omega-3 Fatty Acids (FISH OIL) 1000 MG CAPS Take 2,000 mg by mouth daily.    [provider]  Saw Palmetto 450 MG CAPS Take 900 mg by mouth daily.     [provider]  vitamin B-12 (CYANOCOBALAMIN) 1000 MCG tablet Take 1,000 mcg by mouth daily.    [provider]  ZETIA 10 MG tablet Take 10 mg by mouth daily.  05/22/15   [provider]    Allergies    Rocephin [ceftriaxone sodium in dextrose], Atorvastatin, Pravastatin, and Rosuvastatin calcium  Review of Systems   Review of Systems  Constitutional: Positive for fatigue. Negative for fever.  HENT: Positive for sore throat. Negative for rhinorrhea.   Eyes: Negative for redness.  Respiratory: Positive for cough. Negative for shortness of breath.   Cardiovascular: Negative for chest pain.  Gastrointestinal: Negative for abdominal pain, diarrhea, nausea and vomiting.  Genitourinary: Positive for enuresis. Negative for dysuria.  Musculoskeletal: Negative for myalgias.  Skin: Negative for rash.  Neurological: Positive for weakness. Negative for seizures and headaches.    Physical Exam Updated Vital Signs BP (!) 155/108 (BP Location: Left  Arm)   Pulse (!) 103   Temp 98.9 F (37.2 C) (Oral)   Resp 16   SpO2 99%   Physical Exam Vitals and nursing note reviewed.  Constitutional:      Appearance: He is well-developed.  HENT:     Head: Normocephalic and atraumatic.     Jaw: No trismus.     Right Ear: Tympanic membrane, ear canal and external ear normal.     Left Ear: Tympanic membrane, ear canal and external ear normal.     Nose: Nose normal. No mucosal edema or rhinorrhea.     Mouth/Throat:     Mouth: Mucous membranes are  not dry.     Pharynx: Uvula midline. Posterior oropharyngeal erythema present. No oropharyngeal exudate or uvula swelling.     Tonsils: No tonsillar abscesses.     Comments: Mild erythema posterior oropharynx. Eyes:     General: Lids are normal.        Right eye: No discharge.        Left eye: No discharge.     Conjunctiva/sclera: Conjunctivae normal.     Pupils: Pupils are equal, round, and reactive to light.  Cardiovascular:     Rate and Rhythm: Normal rate and regular rhythm.     Heart sounds: Normal heart sounds.  Pulmonary:     Effort: Pulmonary effort is normal. No respiratory distress.     Breath sounds: Normal breath sounds. No wheezing or rales.  Abdominal:     Palpations: Abdomen is soft.     Tenderness: There is no abdominal tenderness.  Musculoskeletal:        General: Normal range of motion.     Cervical back: Normal range of motion and neck supple. No tenderness or bony tenderness.  Lymphadenopathy:     Cervical: No cervical adenopathy.  Skin:    General: Skin is warm and dry.  Neurological:     Mental Status: He is alert and oriented to person, place, and time.     GCS: GCS eye subscore is 4. GCS verbal subscore is 5. GCS motor subscore is 6.     Cranial Nerves: No cranial nerve deficit.     Sensory: No sensory deficit.     Motor: No abnormal muscle tone.     Coordination: Coordination normal.     Gait: Gait normal.     Deep Tendon Reflexes: Reflexes are normal and  symmetric.     ED Results / Procedures / Treatments   Labs (all labs ordered are listed, but only abnormal results are displayed) Labs Reviewed  BASIC METABOLIC PANEL - Abnormal; Notable for the following components:      Result Value   Glucose, Bld 126 (*)    All other components within normal limits  CBC - Abnormal; Notable for the following components:   WBC 11.8 (*)    RBC 3.40 (*)    Hemoglobin 10.8 (*)    HCT 34.0 (*)    All other components within normal limits  URINALYSIS, ROUTINE W REFLEX MICROSCOPIC - Abnormal; Notable for the following components:   Ketones, ur 5 (*)    Protein, ur 30 (*)    All other components within normal limits  VALPROIC ACID LEVEL - Abnormal; Notable for the following components:   Valproic Acid Lvl 17 (*)    All other components within normal limits  SARS CORONAVIRUS 2 (TAT 6-24 HRS)  TSH  TROPONIN I (HIGH SENSITIVITY)  TROPONIN I (HIGH SENSITIVITY)    EKG EKG Interpretation  Date/Time:  Wednesday August 08 2019 13:53:56 EST Ventricular Rate:  86 PR Interval:  142 QRS Duration: 86 QT Interval:  370 QTC Calculation: 442 R Axis:   23 Text Interpretation: Normal sinus rhythm Possible Anterior infarct , age undetermined Abnormal ECG Confirmed by Tilden Fossa [phone]) on 08/08/2019 6:38:57 PM   Radiology DG Chest 2 View  Result Date: 08/08/2019 CLINICAL DATA:  Weakness for 1 day. EXAM: CHEST - 2 VIEW COMPARISON:  08/02/2016 FINDINGS: There is minimal left basilar scarring. There is no focal consolidation. There is no pleural effusion or pneumothorax. The heart and mediastinal contours are unremarkable. There is a large hiatal  hernia. There is osteoarthritis of bilateral glenohumeral joints. IMPRESSION: No active cardiopulmonary disease. Electronically Signed   By: Elige KoHetal  Patel   On: 08/08/2019 19:52    Procedures Procedures (including critical care time)  Medications Ordered in ED Medications  sodium chloride 0.9 % bolus 500 mL  (0 mLs Intravenous Stopped 08/08/19 2149)  divalproex (DEPAKOTE ER) 24 hr tablet 750 mg (750 mg Oral Given 08/08/19 2149)    ED Course  I have reviewed the triage vital signs and the nursing notes.  Pertinent labs & imaging results that were available during my care of the patient were reviewed by me and considered in my medical decision making (see chart for details).  EKG reviewed. Unchanged. Pt not yet in room when I went to check. Added CXR, depakote level. UA clear.   Vital signs reviewed and are as follows: BP (!) 155/108 (BP Location: Left Arm)   Pulse (!) 103   Temp 98.9 F (37.2 C) (Oral)   Resp 16   SpO2 99%   7:46 PM Patient now appears to be in x-ray.   8:09 PM Pt seen and examined.   8:24 PM I had a good discussion with the patient's daughter by telephone.  Patient had difficulty getting out of a chair today due to generalized weakness.  No strokelike symptoms reported.  Patient looks reasonably well now.  Will give hydration, await Depakote level.  If he is unable to walk here, he will need additional work-up and possible admission.  11:43 PM Patient was unable to ambulate at baseline and continues to be very unsteady. CT head, troponin, and TSH were added. Fortunately these were normal.   I called the patient's family back and talked about these results.  I asked him about their comfort level taking him home at the current time.  They would prefer, given living situation as well as stairs in the house that he be observed overnight.  They seem very reasonable but have concerns about bring him home if he cannot ambulate at his baseline, which currently he cannot.  I told him I would discuss this with hospitalist team.  12:00 AM I spoke with Dr. Mikeal HawthorneGarba who will see patient.    MDM Rules/Calculators/A&P                      Will admit for observation.     Final Clinical Impression(s) / ED Diagnoses Final diagnoses:  Generalized weakness    Rx / DC Orders ED  Discharge Orders    None       Renne CriglerGeiple, Keidan Aumiller, PA-C 08/09/19 0001    Tilden Fossaees, Elizabeth, MD 08/12/19 [phone]

## 2019-08-08 NOTE — ED Notes (Signed)
Pt ambulated with stand by assist. Weak and unsteady. Josh PA aware.

## 2019-08-08 NOTE — ED Notes (Signed)
Patricia Pesa (Daughter [phone]) called for update/also would like for her father to call her.

## 2019-08-08 NOTE — ED Triage Notes (Signed)
Pt arrives via EMS from home with increased weakness. Tested negative for covid this week. Daughter states he is not moving around like normal. 88HR 126 palp, 96% RA, 161 CBG

## 2019-08-09 ENCOUNTER — Encounter (HOSPITAL_COMMUNITY): Payer: Self-pay | Admitting: Internal Medicine

## 2019-08-09 DIAGNOSIS — R531 Weakness: Secondary | ICD-10-CM | POA: Diagnosis not present

## 2019-08-09 LAB — CBC
HCT: 30.9 % — ABNORMAL LOW (ref 39.0–52.0)
Hemoglobin: 10 g/dL — ABNORMAL LOW (ref 13.0–17.0)
MCH: 31.3 pg (ref 26.0–34.0)
MCHC: 32.4 g/dL (ref 30.0–36.0)
MCV: 96.6 fL (ref 80.0–100.0)
Platelets: 162 10*3/uL (ref 150–400)
RBC: 3.2 MIL/uL — ABNORMAL LOW (ref 4.22–5.81)
RDW: 12.9 % (ref 11.5–15.5)
WBC: 8.1 10*3/uL (ref 4.0–10.5)
nRBC: 0 % (ref 0.0–0.2)

## 2019-08-09 LAB — COMPREHENSIVE METABOLIC PANEL
ALT: 10 U/L (ref 0–44)
AST: 21 U/L (ref 15–41)
Albumin: 3.1 g/dL — ABNORMAL LOW (ref 3.5–5.0)
Alkaline Phosphatase: 83 U/L (ref 38–126)
Anion gap: 10 (ref 5–15)
BUN: 11 mg/dL (ref 8–23)
CO2: 28 mmol/L (ref 22–32)
Calcium: 9.1 mg/dL (ref 8.9–10.3)
Chloride: 104 mmol/L (ref 98–111)
Creatinine, Ser: 0.93 mg/dL (ref 0.61–1.24)
GFR calc Af Amer: >60 mL/min (ref >60)
GFR calc non Af Amer: >60 mL/min (ref >60)
Glucose, Bld: 101 mg/dL — ABNORMAL HIGH (ref 70–99)
Potassium: 4.2 mmol/L (ref 3.5–5.1)
Sodium: 142 mmol/L (ref 135–145)
Total Bilirubin: 0.8 mg/dL (ref 0.3–1.2)
Total Protein: 5.9 g/dL — ABNORMAL LOW (ref 6.5–8.1)

## 2019-08-09 LAB — TSH: TSH: 5.645 u[IU]/mL — ABNORMAL HIGH (ref 0.350–4.500)

## 2019-08-09 LAB — TROPONIN I (HIGH SENSITIVITY): Troponin I (High Sensitivity): 11 ng/L (ref <18)

## 2019-08-09 LAB — SARS CORONAVIRUS 2 (TAT 6-24 HRS): SARS Coronavirus 2: NEGATIVE

## 2019-08-09 MED ORDER — ACETAMINOPHEN 650 MG RE SUPP: 650.0000 mg | Freq: Four times a day (QID) | RECTAL | Status: DC | PRN

## 2019-08-09 MED ORDER — ACETAMINOPHEN 325 MG PO TABS: 650.0000 mg | ORAL_TABLET | Freq: Four times a day (QID) | ORAL | Status: DC | PRN

## 2019-08-09 MED ORDER — ONDANSETRON HCL 4 MG PO TABS: 4.0000 mg | ORAL_TABLET | Freq: Four times a day (QID) | ORAL | Status: DC | PRN

## 2019-08-09 MED ORDER — DEXTROSE-NACL 5-0.9 % IV SOLN: INTRAVENOUS | Status: DC

## 2019-08-09 MED ORDER — ENOXAPARIN SODIUM 40 MG/0.4ML ~~LOC~~ SOLN
40.0000 mg | Freq: Every day | SUBCUTANEOUS | Status: DC
  Administered 2019-08-09: 40 mg via SUBCUTANEOUS
  Filled 2019-08-09: qty 0.4

## 2019-08-09 MED ORDER — ONDANSETRON HCL 4 MG/2ML IJ SOLN: 4.0000 mg | Freq: Four times a day (QID) | INTRAMUSCULAR | Status: DC | PRN

## 2019-08-09 MED ORDER — DIVALPROEX SODIUM ER 500 MG PO TB24
750.0000 mg | ORAL_TABLET | Freq: Every day | ORAL | Status: DC
  Administered 2019-08-09: 09:00:00 750 mg via ORAL
  Filled 2019-08-09: qty 1

## 2019-08-09 MED ORDER — HYDRALAZINE HCL 10 MG PO TABS: 10.0000 mg | ORAL_TABLET | Freq: Four times a day (QID) | ORAL | Status: DC | PRN

## 2019-08-09 NOTE — ED Notes (Signed)
ED TO INPATIENT HANDOFF REPORT  ED Nurse Phone :  [phone]  S Name/Age/Gender Nicholas Gaines 85 y.o. male Room/Bed: RESUSC/RESUSC  Code Status   Code Status: Prior  Home/SNF/Other Home   Triage Complete: Triage complete  Chief Complaint Generalized weakness [R53.1]  Triage Note Pt arrives via EMS from home with increased weakness. Tested negative for covid this week. Daughter states he is not moving around like normal. 88HR 126 palp, 96% RA, 161 CBG    Allergies Allergies  Allergen Reactions  . Rocephin [Ceftriaxone Sodium In Dextrose] Other (See Comments)    C-Diff  . Atorvastatin     Other reaction(s): myalgia  . Pravastatin     Other reaction(s): myalgia  . Rosuvastatin Calcium     Other reaction(s): fatigued    Level of Care/Admitting Diagnosis ED Disposition    ED Disposition Condition Comment   Admit  Hospital Area: [facility] [[id number]]  Level of Care: Med-Surg [16]  I expect the patient will be discharged within 24 hours: Yes  LOW acuity---Tx typically complete <24 hrs---ACUTE conditions typically can be evaluated <24 hours---LABS likely to return to acceptable levels <24 hours---IS near functional baseline---EXPECTED to return to current living arrangement---NOT newly hypoxic: Meets criteria for 5C-Observation unit  Covid Evaluation: Confirmed COVID Negative  Diagnosis: Generalized weakness [454098]  Admitting Physician: Rometta Emery [[id number]]  Attending Physician: Rometta Emery [[id number]]  PT Class (Do Not Modify): Observation [104]  PT Acc Code (Do Not Modify): Observation [10022]       B Medical/Surgery History Past Medical History:  Diagnosis Date  . A-fib ([facility])   . Arthritis   . GERD (gastroesophageal reflux disease)   . Heart murmur   . HLD (hyperlipidemia)   . Seizures ([facility])    last sz 04/17/17  . Stroke [facility])    tia   Past Surgical History:  Procedure Laterality Date  . CARPAL TUNNEL RELEASE     About 10 yr ago  (08/13/16)  . LOOP RECORDER INSERTION N/A 09/16/2016   Procedure: Loop Recorder Insertion;  Surgeon: Hillis Range, MD;  Location: [facility] LAB;  Service: Cardiovascular;  Laterality: N/A;  . TONSILLECTOMY       A IV Location/Drains/Wounds Patient Lines/Drains/Airways Status   Active Line/Drains/Airways    Name:   Placement date:   Placement time:   Site:   Days:   Peripheral IV 08/08/19 Left Wrist   08/08/19    2004    Wrist   1          Intake/Output Last 24 hours  Intake/Output Summary (Last 24 hours) at 08/09/2019 0215 Last data filed at 08/08/2019 2149 Gross per 24 hour  Intake 500 ml  Output --  Net 500 ml    Labs/Imaging Results for orders placed or performed during the hospital encounter of 08/08/19 (from the past 48 hour(s))  Basic metabolic panel     Status: Abnormal   Collection Time: 08/08/19  2:15 PM  Result Value Ref Range   Sodium 141 135 - 145 mmol/L   Potassium 4.2 3.5 - 5.1 mmol/L   Chloride 104 98 - 111 mmol/L   CO2 27 22 - 32 mmol/L   Glucose, Bld 126 (H) 70 - 99 mg/dL   BUN 13 8 - 23 mg/dL   Creatinine, Ser 1.19 0.61 - 1.24 mg/dL   Calcium 9.0 8.9 - 14.7 mg/dL   GFR calc non Af Amer >60 >60 mL/min   GFR calc Af Amer >60 >  60 mL/min   Anion gap 10 5 - 15    Comment: Performed at [facility], [address], [place], [place] [address]  CBC     Status: Abnormal   Collection Time: 08/08/19  2:15 PM  Result Value Ref Range   WBC 11.8 (H) 4.0 - 10.5 K/uL   RBC 3.40 (L) 4.22 - 5.81 MIL/uL   Hemoglobin 10.8 (L) 13.0 - 17.0 g/dL   HCT 78.2 (L) 95.6 - 21.3 %   MCV 100.0 80.0 - 100.0 fL   MCH 31.8 26.0 - 34.0 pg   MCHC 31.8 30.0 - 36.0 g/dL   RDW 08.6 57.8 - 46.9 %   Platelets 187 150 - 400 K/uL   nRBC 0.0 0.0 - 0.2 %    Comment: Performed at [facility], [address], [place], [place] [address]  Urinalysis, Routine w reflex microscopic     Status: Abnormal   Collection Time: 08/08/19  3:45 PM  Result Value Ref Range   Color,  Urine YELLOW YELLOW   APPearance CLEAR CLEAR   Specific Gravity, Urine 1.017 1.005 - 1.030   pH 8.0 5.0 - 8.0   Glucose, UA NEGATIVE NEGATIVE mg/dL   Hgb urine dipstick NEGATIVE NEGATIVE   Bilirubin Urine NEGATIVE NEGATIVE   Ketones, ur 5 (A) NEGATIVE mg/dL   Protein, ur 30 (A) NEGATIVE mg/dL   Nitrite NEGATIVE NEGATIVE   Leukocytes,Ua NEGATIVE NEGATIVE   RBC / HPF 0-5 0 - 5 RBC/hpf   WBC, UA 0-5 0 - 5 WBC/hpf   Bacteria, UA NONE SEEN NONE SEEN    Comment: Performed at [facility], [address], [place], [place] [address]  Depakote level     Status: Abnormal   Collection Time: 08/08/19  7:45 PM  Result Value Ref Range   Valproic Acid Lvl 17 (L) 50.0 - 100.0 ug/mL    Comment: Performed at [facility], [address] [place], [place] [address]  SARS CORONAVIRUS 2 (TAT 6-24 HRS) Nasopharyngeal Nasopharyngeal Swab     Status: None   Collection Time: 08/08/19  8:18 PM   Specimen: Nasopharyngeal Swab  Result Value Ref Range   SARS Coronavirus 2 NEGATIVE NEGATIVE    Comment: (NOTE) SARS-CoV-2 target nucleic acids are NOT DETECTED. The SARS-CoV-2 RNA is generally detectable in upper and lower respiratory specimens during the acute phase of infection. Negative results do not preclude SARS-CoV-2 infection, do not rule out co-infections with other pathogens, and should not be used as the sole basis for treatment or other patient management decisions. Negative results must be combined with clinical observations, patient history, and epidemiological information. The expected result is Negative. Fact Sheet for Patients: HairSlick.no Fact Sheet for Healthcare Providers: quierodirigir.com This test is not yet approved or cleared by the [place] FDA and  has been authorized for detection and/or diagnosis of SARS-CoV-2 by FDA under an Emergency Use Authorization (EUA). This EUA will remain  in effect (meaning this test  can be used) for the duration of the COVID-19 declaration under Section 56 4(b)(1) of the Act, 21 U.S.C. section 360bbb-3(b)(1), unless the authorization is terminated or revoked sooner. Performed at [facility], [address], [place], [place] [address]   TSH     Status: None   Collection Time: 08/08/19  9:54 PM  Result Value Ref Range   TSH 2.931 0.350 - 4.500 uIU/mL    Comment: Performed by a 3rd Generation assay with a  functional sensitivity of <=0.01 uIU/mL. Performed at [facility], [place] [address] [place], [place] [address]   Troponin I (High Sensitivity)     Status: None   Collection Time: 08/08/19  9:54 PM  Result Value Ref Range   Troponin I (High Sensitivity) 10 <18 ng/L    Comment: (NOTE) Elevated high sensitivity troponin I (hsTnI) values and significant  changes across serial measurements may suggest ACS but many other  chronic and acute conditions are known to elevate hsTnI results.  Refer to the "Links" section for chest pain algorithms and additional  guidance. Performed at [facility], [place] [address], [place], [place] [address]    DG Chest 2 View  Result Date: 08/08/2019 CLINICAL DATA:  Weakness for 1 day. EXAM: CHEST - 2 VIEW COMPARISON:  08/02/2016 FINDINGS: There is minimal left basilar scarring. There is no focal consolidation. There is no pleural effusion or pneumothorax. The heart and mediastinal contours are unremarkable. There is a large hiatal hernia. There is osteoarthritis of bilateral glenohumeral joints. IMPRESSION: No active cardiopulmonary disease. Electronically Signed   By: Kathreen Devoid   On: 08/08/2019 19:52   CT Head Wo Contrast  Result Date: 08/08/2019 CLINICAL DATA:  Ataxia.  Stroke suspected. EXAM: CT HEAD WITHOUT CONTRAST TECHNIQUE: Contiguous axial images were obtained from the base of the skull through the vertex without intravenous contrast. COMPARISON:  CT head dated 09/09/2016 FINDINGS: Brain: No evidence of acute  infarction, hemorrhage, hydrocephalus, extra-axial collection or mass lesion/mass effect. Atrophy and extensive chronic microvascular ischemic changes are noted. Vascular: No hyperdense vessel or unexpected calcification. Skull: Normal. Negative for fracture or focal lesion. Sinuses/Orbits: No acute finding. Other: None. IMPRESSION: 1. No acute intracranial abnormality. 2. Atrophy and chronic microvascular ischemic changes are again noted. Electronically Signed   By: Constance Holster M.D.   On: 08/08/2019 23:31    Pending Labs [employer] (From admission, onward)    Start     Ordered   Signed and Held  CBC  (enoxaparin (LOVENOX)    CrCl >/= 30 ml/min)  Once,   R    Comments: Baseline for enoxaparin therapy IF NOT ALREADY DRAWN.  Notify MD if PLT < 100 K.    Signed and Held   Signed and Held  Creatinine, serum  (enoxaparin (LOVENOX)    CrCl >/= 30 ml/min)  Once,   R    Comments: Baseline for enoxaparin therapy IF NOT ALREADY DRAWN.    Signed and Held   Signed and Held  Creatinine, serum  (enoxaparin (LOVENOX)    CrCl >/= 30 ml/min)  Weekly,   R    Comments: while on enoxaparin therapy    Signed and Held   Signed and Held  Comprehensive metabolic panel  Tomorrow morning,   R     Signed and Held   Signed and Held  CBC  Tomorrow morning,   R     Signed and Held   Signed and Held  TSH  Once,   R     Signed and Held          Vitals/Pain Today's Vitals   08/09/19 0115 08/09/19 0130 08/09/19 0136 08/09/19 0145  BP: (!) 163/99 (!) 145/97  (!) 139/99  Pulse: 88 86  86  Resp:  18    Temp:      TempSrc:      SpO2: 98% 97%  98%  PainSc:  Asleep 0-No pain     Isolation Precautions No  active isolations  Medications Medications  sodium chloride 0.9 % bolus 500 mL (0 mLs Intravenous Stopped 08/08/19 2149)  divalproex (DEPAKOTE ER) 24 hr tablet 750 mg (750 mg Oral Given 08/08/19 2149)    Mobility walks High fall risk   Focused Assessments Denies pain / respirations  unlabored , IV site intact .    R Recommendations: See Admitting Provider Note  Report given to:   Additional Notes:

## 2019-08-09 NOTE — Evaluation (Signed)
Clinical/Bedside Swallow Evaluation Patient Details  Name: Nicholas Gaines MRN: [id number] Date of Birth: 07/24/1929  Today's Date: 08/09/2019 Time: SLP Start Time (ACUTE ONLY): 78 SLP Stop Time (ACUTE ONLY): 1439 SLP Time Calculation (min) (ACUTE ONLY): 30 min  Past Medical History:  Past Medical History:  Diagnosis Date  . A-fib ([place])   . Arthritis   . GERD (gastroesophageal reflux disease)   . Heart murmur   . HLD (hyperlipidemia)   . Seizures ([place])    last sz 04/17/17  . Stroke [facility])    tia   Past Surgical History:  Past Surgical History:  Procedure Laterality Date  . CARPAL TUNNEL RELEASE     About 10 yr ago (08/13/16)  . LOOP RECORDER INSERTION N/A 09/16/2016   Procedure: Loop Recorder Insertion;  Surgeon: Rieves Grayer, MD;  Location: [place] CV LAB;  Service: Cardiovascular;  Laterality: N/A;  . TONSILLECTOMY     HPI:  Nicholas Gaines is a 85 y.o. male with medical history significant of atrial fibrillation, GERD, seizure disorder, previous CVA with minimal residual weakness, aortic valve disease, hyperlipidemia who was brought in by family secondary to gradual weakness over the last few days to the extent that patient is unable to stand on his feet and walk around. He has tested negative for COVID.   Assessment / Plan / Recommendation Clinical Impression   Pt seen at bedside, in bed, HOB raised. Pt endorses "throat pain" when swallowing that he states began "a few years ago." He states it occurs when swallowing both liquids and solids. He denies seeing an SLP in the past, but does report that he has seen GI MD. When asked to point to location of pain, pt points to left side of larynx. Pt reports he sometimes "gets strangled" when swallowing pills, eating and drinking.  Oral mechanism exam unremarkable. Pt with some missing dentition.  Patient seen with PO trials of thin liquids (water), puree solids (applesauce) and regular solids (graham crackers). Good oral acceptance,  bolus manipulation and swallow appeared timely. Of note, patient with spontaneous double swallow with most trials of thin liquids and applesauce.  No coughing, throat clearing or wet voice quality observed during this evaluation. No overt s/s aspiration or distress observed with any consistency during this BSE.  SLP educated patient re: importance of oral care 4x/day, sitting upright for PO intake, taking time while eating and alternating solids and liquids. Patient verbalized understanding. Plan for patient to d/c from hospital today. Therefore, recommending f/u with  [facility] SLP.   SLP discussed results with patient's daughter via telephone. Daughter reports patient has a hx of esophageal stricture w/ esophageal dilation done approx 20 years ago. Daughter also reports patient "gets strangled" when swallowing pills sometimes. SLP shared recommendation for [facility] SLP with pt and daughter, both verbalized agreement.  SLP D/W w/ MD including recommendations for f/u with [facility] SLP. MD agreeable, MD also to recommend referral to ENT and GI.   SLP Visit Diagnosis: Dysphagia, unspecified (R13.10)    Aspiration Risk       Diet Recommendation Thin liquid;Regular   Compensations: Slow rate;Small sips/bites;Follow solids with liquid Postural Changes: Seated upright at 90 degrees    Other  Recommendations Recommended Consults: Consider ENT evaluation;Consider GI evaluation Oral Care Recommendations: Oral care QID   Follow up Recommendations Home health SLP      Frequency and Duration   n/a         Prognosis   good     Swallow Study  General HPI: Nicholas Gaines is a 85 y.o. male with medical history significant of atrial fibrillation, GERD, seizure disorder, previous CVA with minimal residual weakness, aortic valve disease, hyperlipidemia who was brought in by family secondary to gradual weakness over the last few days to the extent that patient is unable to stand on his feet and walk around. He has  tested negative for COVID. Type of Study: Bedside Swallow Evaluation Diet Prior to this Study: Regular;Thin liquids Temperature Spikes Noted: No Respiratory Status: Room air Behavior/Cognition: Cooperative;Pleasant mood(minimally confused) Oral Cavity Assessment: Within Functional Limits Oral Cavity - Dentition: Missing dentition Vision: Functional for self-feeding Self-Feeding Abilities: Able to feed self Patient Positioning: Upright in bed Baseline Vocal Quality: Hoarse Volitional Cough: Weak;Strong Volitional Swallow: Able to elicit    Oral/Motor/Sensory Function Overall Oral Motor/Sensory Function: Within functional limits   Ice Chips Ice chips: Not tested   Thin Liquid Thin Liquid: Within functional limits Presentation: Cup;Self Fed    Nectar Thick Nectar Thick Liquid: Not tested   Honey Thick     Puree Puree: Within functional limits Presentation: Self Fed   Solid     Solid: Within functional limits      Shella Spearing, M.Ed., CCC-SLP Speech Therapy Acute Rehab 08/09/2019,2:45 PM

## 2019-08-09 NOTE — Progress Notes (Signed)
Pt discharge education provided at bedside with pt and pt daughter  Pt IV removed by NT, catheter intact and telemetry removed  Pt has all belongings  Pt discharged via wheelchair with RN

## 2019-08-09 NOTE — H&P (Signed)
History and Physical   Nicholas Gaines ZOX:[id number] DOB: 1928-12-20 DOA: 08/08/2019  Referring MD/NP/PA: Dr. Madilyn Hook  PCP: [name], MD   Outpatient Specialists: None  Patient coming from: Home  Chief Complaint: Generalized weakness  HPI: Nicholas Gaines is a 85 y.o. male with medical history significant of atrial fibrillation, GERD, seizure disorder, previous CVA with minimal residual weakness, aortic valve disease, hyperlipidemia who was brought in by family secondary to gradual weakness over the last few days to the extent that patient is unable to stand on his feet and walk around.  Patient symptoms started with some upper respiratory tract infection mainly scratchy throat about a week ago.  He was seen by his PCP a few days ago with Covid tested negative.  Patient was being treated symptomatically.  He normally lives alone with family who checks on him they do not live far away.  Today however they noted his weakness is progressed to where he could not get out of his recliner.  Denied any nausea vomiting or diarrhea but his oral intake has apparently dropped.  Patient has had occasional cough.  Here in the ER repeat Covid testing is pending.  This patient however was noted to be mildly dehydrated.  All other work-up appears to be stable he is being admitted to the hospital for observation and physical therapy evaluation..  ED Course: Temperature is 99 blood pressure 160/96 pulse 103 respirate of 16 oxygen sat 90% on room air.  He has a white count 11.8 hemoglobin 10.9 platelets 187.  Chemistry so far within normal with glucose 126.  Head CT without contrast and chest x-ray were both within normal.  TSH 2.931 troponin obtain and Depakote level is 17.  Patient reportedly missed a dose yesterday.  Review of Systems: As per HPI otherwise 10 point review of systems negative.    Past Medical History:  Diagnosis Date  . A-fib ([facility])   . Arthritis   . GERD (gastroesophageal reflux disease)   .  Heart murmur   . HLD (hyperlipidemia)   . Seizures ([facility])    last sz 04/17/17  . Stroke [facility])    tia    Past Surgical History:  Procedure Laterality Date  . CARPAL TUNNEL RELEASE     About 10 yr ago (08/13/16)  . LOOP RECORDER INSERTION N/A 09/16/2016   Procedure: Loop Recorder Insertion;  Surgeon: Hillis Range, MD;  [facility];  Service: Cardiovascular;  Laterality: N/A;  . TONSILLECTOMY       reports that he has never smoked. He has never used smokeless tobacco. He reports that he does not drink alcohol or use drugs.  Allergies  Allergen Reactions  . Rocephin [Ceftriaxone Sodium In Dextrose] Other (See Comments)    C-Diff  . Atorvastatin     Other reaction(s): myalgia  . Pravastatin     Other reaction(s): myalgia  . Rosuvastatin Calcium     Other reaction(s): fatigued    Family History  Problem Relation Age of Onset  . Hypertension Mother   . Parkinson's disease Mother   . Heart attack Mother   . Stroke Sister   . Lymphoma Sister   . Stroke Brother   . CVA Father   . Stroke Paternal Grandmother        in her early 2's  . Stroke Paternal Grandfather        in his early 33's  . Pulmonary embolism Sister        04/2018  . Diabetes  Neg Hx      Prior to Admission medications   Medication Sig Start Date End Date Taking? Authorizing Provider  acetaminophen (TYLENOL) 500 MG tablet Take 650 mg by mouth 2 (two) times daily.     [provider]  aspirin EC 325 MG tablet Take 325 mg by mouth daily.    [provider]  Cholecalciferol 1000 units capsule Take 4,000 Units by mouth daily.     [provider]  clopidogrel (PLAVIX) 75 MG tablet Take 75 mg by mouth daily.  05/22/15   [provider]  divalproex (DEPAKOTE ER) 250 MG 24 hr tablet TAKE 1 TABLET BY MOUTH EVERY DAY WITH 500MG  TABLET FOR A TOTAL OF 750MG  07/23/19   Lomax, Amy, NP  divalproex (DEPAKOTE ER) 500 MG 24 hr tablet TAKE 1 TABLET BY MOUTH EVERY DAY WITH 250MG   TABLET FOR TOTAL OF 750MG  07/23/19   Lomax, Amy, NP  famotidine (PEPCID) 20 MG tablet  05/28/19   [provider]  ferrous sulfate 325 (65 FE) MG EC tablet Take 325 mg by mouth daily with breakfast.    [provider]  ibuprofen (ADVIL,MOTRIN) 200 MG tablet Take 600 mg by mouth daily as needed for headache or moderate pain.     [provider]  LYCOPENE PO Take 10 mg by mouth daily.     [provider]  Multiple Vitamins-Minerals (ICAPS) CAPS Take 1 capsule by mouth daily.    [provider]  Omega-3 Fatty Acids (FISH OIL) 1000 MG CAPS Take 2,000 mg by mouth daily.    [provider]  Saw Palmetto 450 MG CAPS Take 900 mg by mouth daily.     [provider]  vitamin B-12 (CYANOCOBALAMIN) 1000 MCG tablet Take 1,000 mcg by mouth daily.    [provider]  ZETIA 10 MG tablet Take 10 mg by mouth daily.  05/22/15   [provider]    Physical Exam: Vitals:   08/08/19 2200 08/08/19 2215 08/08/19 2230 08/08/19 2245  BP: (!) 153/96 (!) 148/88 (!) 148/91 (!) 146/84  Pulse: 92 88 88 90  Resp:      Temp:      TempSrc:      SpO2: 100% 100% 99% 100%      Constitutional: Frail and weak old man no acute findings Vitals:   08/08/19 2200 08/08/19 2215 08/08/19 2230 08/08/19 2245  BP: (!) 153/96 (!) 148/88 (!) 148/91 (!) 146/84  Pulse: 92 88 88 90  Resp:      Temp:      TempSrc:      SpO2: 100% 100% 99% 100%   Eyes: PERRL, lids and conjunctivae normal ENMT: Mucous membranes are dry. Posterior pharynx clear of any exudate or lesions.Normal dentition.  Neck: normal, supple, no masses, no thyromegaly Respiratory: clear to auscultation bilaterally, no wheezing, no crackles. Normal respiratory effort. No accessory muscle use.  Cardiovascular: Tachycardia no murmurs / rubs / gallops. No extremity edema. 2+ pedal pulses. No carotid bruits.  Abdomen: no tenderness, no masses palpated. No hepatosplenomegaly. Bowel sounds  positive.  Musculoskeletal: no clubbing / cyanosis. No joint deformity upper and lower extremities. Good ROM, no contractures. Normal muscle tone.  Skin: no rashes, lesions, ulcers. No induration Neurologic: CN 2-12 grossly intact. Sensation intact, DTR normal. Strength 5/5 in all 4.  Psychiatric: Normal judgment and insight. Alert and oriented x 3. Normal mood.     Labs on Admission: I have personally reviewed following labs and imaging studies  CBC: Recent Labs  Lab 08/08/19 1415  WBC 11.8*  HGB 10.8*  HCT 34.0*  MCV 100.0  PLT 187   Basic Metabolic Panel: Recent Labs  Lab 08/08/19 1415  NA 141  K 4.2  CL 104  CO2 27  GLUCOSE 126*  BUN 13  CREATININE 1.04  CALCIUM 9.0   GFR: CrCl cannot be calculated (Unknown ideal weight.). Liver Function Tests: No results for input(s): AST, ALT, ALKPHOS, BILITOT, PROT, ALBUMIN in the last 168 hours. No results for input(s): LIPASE, AMYLASE in the last 168 hours. No results for input(s): AMMONIA in the last 168 hours. Coagulation Profile: No results for input(s): INR, PROTIME in the last 168 hours. Cardiac Enzymes: No results for input(s): CKTOTAL, CKMB, CKMBINDEX, TROPONINI in the last 168 hours. BNP (last 3 results) No results for input(s): PROBNP in the last 8760 hours. HbA1C: No results for input(s): HGBA1C in the last 72 hours. CBG: No results for input(s): GLUCAP in the last 168 hours. Lipid Profile: No results for input(s): CHOL, HDL, LDLCALC, TRIG, CHOLHDL, LDLDIRECT in the last 72 hours. Thyroid Function Tests: Recent Labs    08/08/19 2154  TSH 2.931   Anemia Panel: No results for input(s): VITAMINB12, FOLATE, FERRITIN, TIBC, IRON, RETICCTPCT in the last 72 hours. Urine analysis:    Component Value Date/Time   COLORURINE YELLOW 08/08/2019 1545   APPEARANCEUR CLEAR 08/08/2019 1545   LABSPEC 1.017 08/08/2019 1545   PHURINE 8.0 08/08/2019 1545   GLUCOSEU NEGATIVE 08/08/2019 1545   HGBUR NEGATIVE 08/08/2019  1545   BILIRUBINUR NEGATIVE 08/08/2019 1545   KETONESUR 5 (A) 08/08/2019 1545   PROTEINUR 30 (A) 08/08/2019 1545   NITRITE NEGATIVE 08/08/2019 1545   LEUKOCYTESUR NEGATIVE 08/08/2019 1545   Sepsis Labs: @LABRCNTIP (procalcitonin:4,lacticidven:4) ) Recent Results (from the past 240 hour(s))  SARS CORONAVIRUS 2 (TAT 6-24 HRS) Nasopharyngeal Nasopharyngeal Swab     Status: None   Collection Time: 08/08/19  8:18 PM   Specimen: Nasopharyngeal Swab  Result Value Ref Range Status   SARS Coronavirus 2 NEGATIVE NEGATIVE Final    Comment: (NOTE) SARS-CoV-2 target nucleic acids are NOT DETECTED. The SARS-CoV-2 RNA is generally detectable in upper and lower respiratory specimens during the acute phase of infection. Negative results do not preclude SARS-CoV-2 infection, do not rule out co-infections with other pathogens, and should not be used as the sole basis for treatment or other patient management decisions. Negative results must be combined with clinical observations, patient history, and epidemiological information. The expected result is Negative. Fact Sheet for Patients: HairSlick.nohttps://www.fda.gov/media/138098/download Fact Sheet for Healthcare Providers: quierodirigir.comhttps://www.fda.gov/media/138095/download This test is not yet approved or cleared by the [place] FDA and  has been authorized for detection and/or diagnosis of SARS-CoV-2 by FDA under an Emergency Use Authorization (EUA). This EUA will remain  in effect (meaning this test can be used) for the duration of the COVID-19 declaration under Section 56 4(b)(1) of the Act, 21 U.S.C. section 360bbb-3(b)(1), unless the authorization is terminated or revoked sooner. Performed at [facility], [address] [place], [place] [address]      Radiological Exams on Admission: DG Chest 2 View  Result Date: 08/08/2019 CLINICAL DATA:  Weakness for 1 day. EXAM: CHEST - 2 VIEW COMPARISON:  08/02/2016 FINDINGS: There is minimal left  basilar scarring. There is no focal consolidation. There is no pleural effusion or pneumothorax. The heart and mediastinal contours are unremarkable. There is a large hiatal hernia. There is osteoarthritis of bilateral glenohumeral joints. IMPRESSION: No  active cardiopulmonary disease. Electronically Signed   By: Kathreen Devoid   On: 08/08/2019 19:52   CT Head Wo Contrast  Result Date: 08/08/2019 CLINICAL DATA:  Ataxia.  Stroke suspected. EXAM: CT HEAD WITHOUT CONTRAST TECHNIQUE: Contiguous axial images were obtained from the base of the skull through the vertex without intravenous contrast. COMPARISON:  CT head dated 09/09/2016 FINDINGS: Brain: No evidence of acute infarction, hemorrhage, hydrocephalus, extra-axial collection or mass lesion/mass effect. Atrophy and extensive chronic microvascular ischemic changes are noted. Vascular: No hyperdense vessel or unexpected calcification. Skull: Normal. Negative for fracture or focal lesion. Sinuses/Orbits: No acute finding. Other: None. IMPRESSION: 1. No acute intracranial abnormality. 2. Atrophy and chronic microvascular ischemic changes are again noted. Electronically Signed   By: Constance Holster M.D.   On: 08/08/2019 23:31      Assessment/Plan Principal Problem:   Generalized weakness Active Problems:   Mixed hyperlipidemia   GERD (gastroesophageal reflux disease)   Essential (primary) hypertension   H/O: stroke with residual effects   Complex partial seizure with impairment of consciousness at onset [facility])   Mild cognitive impairment   Seizures ([place])     #1 generalized weakness: Most likely multifactorial.  Probably due to dehydration.  No evidence of acute seizure episode.  At this point will admit the patient.  Hydrate the patient.  Get PT evaluation.  Observe him.  Once we get recommendation from PT will proceed with discharge.  2.  GERD: Resume home regimen.  #3 essential hypertension: Continue with home regimen.  Blood pressure  reasonable and no evidence of orthostasis.  #4 seizure disorder: Depakote level is a little low.  He reportedly missed a dose last night.  We will resume home regimen and increase dose as needed.  #5 mild cognitive impairment: No diagnosis of full-blown dementia.  Continue monitoring.   DVT prophylaxis: Lovenox Code Status: Full code Family Communication: Daughter over the phone Disposition Plan: Home Consults called: None except physical therapy Admission status: Observation  Severity of Illness: The appropriate patient status for this patient is OBSERVATION. Observation status is judged to be reasonable and necessary in order to provide the required intensity of service to ensure the patient's safety. The patient's presenting symptoms, physical exam findings, and initial radiographic and laboratory data in the context of their medical condition is felt to place them at decreased risk for further clinical deterioration. Furthermore, it is anticipated that the patient will be medically stable for discharge from the hospital within 2 midnights of admission. The following factors support the patient status of observation.   " The patient's presenting symptoms include generalized weakness. " The physical exam findings include frail man no distress. " The initial radiographic and laboratory data are just low Depakote level.     Barbette Merino MD [facility] Pager [phone]  If 7PM-7AM, please contact night-coverage [email or web address] Password [facility]  08/09/2019, 12:39 AM

## 2019-08-09 NOTE — Care Management Obs Status (Signed)
[facility] NOTIFICATION   Patient Details  Name: Ysidro Ramsay MRN: [id number] Date of Birth: 06-27-1929   Medicare Observation Status Notification Given:  Yes    Marilu Favre, RN 08/09/2019, 2:35 PM

## 2019-08-09 NOTE — TOC Initial Note (Signed)
Transition of Care [facility]) - Initial/Assessment Note    Patient Details  Name: Nicholas Gaines MRN: [id number] Date of Birth: 1928/12/24  Transition of Care [facility]) CM/SW Contact:    Kingsley Plan, RN Phone Number: 08/09/2019, 2:38 PM  Clinical Narrative:                  Patient from home alone. PT rec HHPT, walker and initial 24hr supervision.   Spoke with patient daughter Nicholas Gaines , patient does live alone but she will provide 24 hour supervision.   Offered to order a walker , however, [name] states patient's grandson is a PT and he will get patient a walker for home.  Expected Discharge Plan: Home w Home Health Services Barriers to Discharge: Continued Medical Work up   Patient Goals and CMS Choice Patient states their goals for this hospitalization and ongoing recovery are:: to go home CMS Medicare.gov Compare Post Acute Care list provided to:: Patient Choice offered to / list presented to : Patient, Adult Children  Expected Discharge Plan and Services Expected Discharge Plan: Home w Home Health Services   Discharge Planning Services: CM Consult   Living arrangements for the past 2 months: Single Family Home                 DME Arranged: N/A         HH Arranged: PT HH Agency: [facility] Date HH Agency Contacted: 08/09/19 Time HH Agency Contacted: 1437 Representative spoke with at [facility] Agency: Lupita Leash  Prior Living Arrangements/Services Living arrangements for the past 2 months: Single Family Home Lives with:: Self Patient language and need for interpreter reviewed:: Yes Do you feel safe going back to the place where you live?: Yes      Need for Family Participation in Patient Care: Yes (Comment) Care giver support system in place?: Yes (comment)   Criminal Activity/Legal Involvement Pertinent to Current Situation/Hospitalization: No - Comment as needed  Activities of Daily Living Home Assistive Devices/Equipment: Eyeglasses ADL  Screening (condition at time of admission) Patient's cognitive ability adequate to safely complete daily activities?: Yes Is the patient deaf or have difficulty hearing?: No Does the patient have difficulty seeing, even when wearing glasses/contacts?: No Does the patient have difficulty concentrating, remembering, or making decisions?: No Patient able to express need for assistance with ADLs?: Yes Does the patient have difficulty dressing or bathing?: Yes Independently performs ADLs?: Yes (appropriate for developmental age) Does the patient have difficulty walking or climbing stairs?: Yes Weakness of Legs: Both Weakness of Arms/Hands: None  Permission Sought/Granted      Share Information with NAME: Nicholas Gaines [phone]  Permission granted to share info w AGENCY: [facility]        Emotional Assessment Appearance:: Appears stated age Attitude/Demeanor/Rapport: Engaged Affect (typically observed): Accepting Orientation: : Oriented to Self, Oriented to Place, Oriented to  Time, Oriented to Situation Alcohol / Substance Use: Not Applicable Psych Involvement: No (comment)  Admission diagnosis:  Generalized weakness [R53.1] Patient Active Problem List   Diagnosis Date Noted  . Generalized weakness 08/09/2019  . Seizures ([facility]) 06/04/2019  . Complex partial seizure with impairment of consciousness at onset [facility]) 05/29/2018  . Mild cognitive impairment 05/29/2018  . Palpitations 09/16/2016  . Anemia 09/09/2016  . Aortic valve calcification 09/09/2016  . Aortic valve sclerosis 09/09/2016  . BPH (benign prostatic hyperplasia) 09/09/2016  . Cardiac murmur 09/09/2016  . Essential (primary) hypertension 09/09/2016  . Facial weakness status post  cerebrovascular accident 09/09/2016  . H/O: stroke with residual effects 09/09/2016  . History of stroke 09/09/2016  . Prediabetes 09/09/2016  . Vitamin D deficiency 09/09/2016  . Alteration of consciousness 08/14/2016  . Acute  encephalopathy 08/02/2016  . Difficulty speaking 08/02/2016  . Bronchitis 08/02/2016  . Mixed hyperlipidemia 08/02/2016  . Stroke ([place])   . GERD (gastroesophageal reflux disease)   . Gastroesophageal reflux disease without esophagitis   . Transient cerebral ischemia    PCP:  Antony Contras, MD Pharmacy:   [facility] [place], [address] AT [address] [place] [address] Phone: [phone] Fax: [phone]     Social Determinants of Health (SDOH) Interventions    Readmission Risk Interventions No flowsheet data found.

## 2019-08-09 NOTE — Discharge Summary (Signed)
Physician Discharge Summary  Nicholas Herrlichllen Ohlendorf LOV:[id number] DOB: 12/28/1928 DOA: 08/08/2019  PCP: Tally JoeSwayne, David, MD  Admit date: 08/08/2019 Discharge date: 08/09/2019  Time spent: 45 minutes  Recommendations for Outpatient Follow-up:  1. Follow up with PCP 1-2 weeks for evaluation of symptoms specifically ability to maintain hydration and nutrition 2. Home health PT and speech 3. Consider referral to GI and/or ENT for evaluation of hx of esophageal stricture, hiatal hernia   Discharge Diagnoses:  Principal Problem:   Generalized weakness Active Problems:   Mixed hyperlipidemia   GERD (gastroesophageal reflux disease)   Essential (primary) hypertension   H/O: stroke with residual effects   Complex partial seizure with impairment of consciousness at onset [facility](HCC)   Mild cognitive impairment   Seizures (HCC)   Discharge Condition: stable  Diet recommendation: heart healthy  Filed Weights   08/09/19 0857  Weight: 63.5 kg    History of present illness:  Nicholas Gaines is a very pleasant 85 y.o. male with medical history significant of atrial fibrillation, GERD, seizure disorder, previous CVA with minimal residual weakness, aortic valve disease, hyperlipidemia who was brought to ED 12/30 by family secondary to gradual weakness over the previous few days to the extent that patient  unable to stand on his feet and walk around.  Patient symptoms started with some upper respiratory tract infection mainly scratchy throat about a week prior.  He was seen by his PCP a few days prior with Covid tested negative.  Patient was being treated symptomatically.  He normally lives alone with family who checks on him they do not live far away. They noted his weakness  progressed to where he could not get out of his recliner.  Denied any nausea vomiting or diarrhea but his oral intake has apparently dropped.  Patient  had occasional cough.  In the ER repeat Covid testing negative.  This patient however was  noted to be mildly dehydrated.  All other work-up appeared to be stable. he was being admitted to the hospital for observation and physical therapy evaluation.Marland Kitchen.  Hospital Course:  #1 generalized weakness: Most likely multifactorial.  Probably due to dehydration. Improved at discharge after IV fluids. Evaluated by PT who recommend HHPT.   No evidence of acute seizure episode. No s/sx infection, no metabolic derangement.   2.  GERD: Resume home regimen.  #3 essential hypertension:  controlled. Continue with home regimen.  .  #4 seizure disorder: Depakote level is a little low.  He reportedly missed a dose last night. Resume home regimen. He gets assistance with meds from daughter.   #5 mild cognitive impairment: No diagnosis of full-blown dementia. remained pleasant and cooperative.   #6. Question of aspiration. Hx of esophageal stricture, hiatal hernia. Does cough with liquids sometimes. Evaluated by speech who recommended HH speech and OP follow up with GI and/or ENT  Procedures:  Consultations:  Physical therapy  Speech therapy  Discharge Exam: Vitals:   08/09/19 0851 08/09/19 1234  BP: (!) 144/99 (!) 148/96  Pulse: 88 86  Resp: 16 18  Temp:  98.9 F (37.2 C)  SpO2: 100% 100%    General: awake alert appears somewhat pale and frale Cardiovascular: rrr no mgr no LE edema Respiratory: normal effort BS clear bilaterally no wheeze Neuro: alert oriented to place and self. Bilateral grip 5/5 LE strength with bilateral 5/5 speech soft but clear, voice sounds a bit wet  Discharge Instructions   Discharge Instructions    Call MD for:  severe uncontrolled pain  Complete by: As directed    Call MD for:  temperature >100.4   Complete by: As directed    Diet - low sodium heart healthy   Complete by: As directed    Discharge instructions   Complete by: As directed    Follow up with PCP in 1-2 weeks for evaluation of symptoms Maintain hydration and nutrition Home Health  physical therapy and speech therapy Consider referral to GI and/or ENT for evaluation of swallowing, hx esophageal stricture, hiatal hernia   Increase activity slowly   Complete by: As directed      Allergies as of 08/09/2019      Reactions   Rocephin [ceftriaxone Sodium In Dextrose] Other (See Comments)   C-Diff   Atorvastatin    Other reaction(s): myalgia   Pravastatin    Other reaction(s): myalgia   Rosuvastatin Calcium    Other reaction(s): fatigued      Medication List    TAKE these medications   acetaminophen 500 MG tablet Commonly known as: TYLENOL Take 500 mg by mouth every 8 (eight) hours as needed (pain).   aspirin EC 325 MG tablet Take 325 mg by mouth daily.   Cholecalciferol 25 MCG (1000 UT) capsule Take 1,000 Units by mouth daily.   clopidogrel 75 MG tablet Commonly known as: PLAVIX Take 75 mg by mouth daily.   divalproex 500 MG 24 hr tablet Commonly known as: DEPAKOTE ER TAKE 1 TABLET BY MOUTH EVERY DAY WITH 250MG  TABLET FOR TOTAL OF 750MG    divalproex 250 MG 24 hr tablet Commonly known as: DEPAKOTE ER TAKE 1 TABLET BY MOUTH EVERY DAY WITH 500MG  TABLET FOR A TOTAL OF 750MG    famotidine 20 MG tablet Commonly known as: PEPCID Take 20 mg by mouth daily.   ferrous sulfate 325 (65 FE) MG EC tablet Take 325 mg by mouth daily with breakfast.   Fish Oil 1000 MG Caps Take 2,000 mg by mouth daily.   ibuprofen 200 MG tablet Commonly known as: ADVIL Take 600 mg by mouth daily as needed for headache or moderate pain.   ICaps Caps Take 1 capsule by mouth daily.   LYCOPENE PO Take 10 mg by mouth daily.   Saw Palmetto 450 MG Caps Take 900 mg by mouth daily.   vitamin B-12 1000 MCG tablet Commonly known as: CYANOCOBALAMIN Take 1,000 mcg by mouth daily.   Zetia 10 MG tablet Generic drug: ezetimibe Take 10 mg by mouth daily.      Allergies  Allergen Reactions  . Rocephin [Ceftriaxone Sodium In Dextrose] Other (See Comments)    C-Diff  .  Atorvastatin     Other reaction(s): myalgia  . Pravastatin     Other reaction(s): myalgia  . Rosuvastatin Calcium     Other reaction(s): fatigued   Follow-up Information    [place], [facility] Follow up.   Contact information: [address] [place]  [phone]            The results of significant diagnostics from this hospitalization (including imaging, microbiology, ancillary and laboratory) are listed below for reference.    Significant Diagnostic Studies: DG Chest 2 View  Result Date: 08/08/2019 CLINICAL DATA:  Weakness for 1 day. EXAM: CHEST - 2 VIEW COMPARISON:  08/02/2016 FINDINGS: There is minimal left basilar scarring. There is no focal consolidation. There is no pleural effusion or pneumothorax. The heart and mediastinal contours are unremarkable. There is a large hiatal hernia. There is osteoarthritis of bilateral glenohumeral  joints. IMPRESSION: No active cardiopulmonary disease. Electronically Signed   By: Elige Ko   On: 08/08/2019 19:52   CT Head Wo Contrast  Result Date: 08/08/2019 CLINICAL DATA:  Ataxia.  Stroke suspected. EXAM: CT HEAD WITHOUT CONTRAST TECHNIQUE: Contiguous axial images were obtained from the base of the skull through the vertex without intravenous contrast. COMPARISON:  CT head dated 09/09/2016 FINDINGS: Brain: No evidence of acute infarction, hemorrhage, hydrocephalus, extra-axial collection or mass lesion/mass effect. Atrophy and extensive chronic microvascular ischemic changes are noted. Vascular: No hyperdense vessel or unexpected calcification. Skull: Normal. Negative for fracture or focal lesion. Sinuses/Orbits: No acute finding. Other: None. IMPRESSION: 1. No acute intracranial abnormality. 2. Atrophy and chronic microvascular ischemic changes are again noted. Electronically Signed   By: Katherine Mantle M.D.   On: 08/08/2019 23:31   CUP PACEART REMOTE DEVICE CHECK  Result Date:  08/06/2019 [facility]link summary report received. Battery status OK. Normal device function. No new symptom episodes, tachy episodes, brady, or pause episodes. No new AF episodes. Monthly summary reports and ROV/PRN   Microbiology: Recent Results (from the past 240 hour(s))  SARS CORONAVIRUS 2 (TAT 6-24 HRS) Nasopharyngeal Nasopharyngeal Swab     Status: None   Collection Time: 08/08/19  8:18 PM   Specimen: Nasopharyngeal Swab  Result Value Ref Range Status   SARS Coronavirus 2 NEGATIVE NEGATIVE Final    Comment: (NOTE) SARS-CoV-2 target nucleic acids are NOT DETECTED. The SARS-CoV-2 RNA is generally detectable in upper and lower respiratory specimens during the acute phase of infection. Negative results do not preclude SARS-CoV-2 infection, do not rule out co-infections with other pathogens, and should not be used as the sole basis for treatment or other patient management decisions. Negative results must be combined with clinical observations, patient history, and epidemiological information. The expected result is Negative. Fact Sheet for Patients: HairSlick.no Fact Sheet for Healthcare Providers: quierodirigir.com This test is not yet approved or cleared by the [place] FDA and  has been authorized for detection and/or diagnosis of SARS-CoV-2 by FDA under an Emergency Use Authorization (EUA). This EUA will remain  in effect (meaning this test can be used) for the duration of the COVID-19 declaration under Section 56 4(b)(1) of the Act, 21 U.S.C. section 360bbb-3(b)(1), unless the authorization is terminated or revoked sooner. Performed at [facility], [address] [place], [place] [address]      Labs: Basic Metabolic Panel: Recent Labs  Lab 08/08/19 1415 08/09/19 0530  NA 141 142  K 4.2 4.2  CL 104 104  CO2 27 28  GLUCOSE 126* 101*  BUN 13 11  CREATININE 1.04 0.93  CALCIUM 9.0 9.1   Liver Function  Tests: Recent Labs  Lab 08/09/19 0530  AST 21  ALT 10  ALKPHOS 83  BILITOT 0.8  PROT 5.9*  ALBUMIN 3.1*   No results for input(s): LIPASE, AMYLASE in the last 168 hours. No results for input(s): AMMONIA in the last 168 hours. CBC: Recent Labs  Lab 08/08/19 1415 08/09/19 0530  WBC 11.8* 8.1  HGB 10.8* 10.0*  HCT 34.0* 30.9*  MCV 100.0 96.6  PLT 187 162   Cardiac Enzymes: No results for input(s): CKTOTAL, CKMB, CKMBINDEX, TROPONINI in the last 168 hours. BNP: BNP (last 3 results) No results for input(s): BNP in the last 8760 hours.  ProBNP (last 3 results) No results for input(s): PROBNP in the last 8760 hours.  CBG: No results for input(s): GLUCAP in the last 168 hours.  Signed:  Radene Gunning NP  [facility] 08/09/2019, 2:40 PM

## 2019-08-09 NOTE — Evaluation (Signed)
Physical Therapy Evaluation Patient Details Name: Nicholas Gaines MRN: [id number] DOB: 08-Aug-1929 Today's Date: 08/09/2019   History of Present Illness  Nicholas Gaines is a 85 y.o. male with medical history significant of atrial fibrillation, GERD, seizure disorder, previous CVA with minimal residual weakness, aortic valve disease, hyperlipidemia who was brought in by family secondary to gradual weakness over the last few days to the extent that patient is unable to stand on his feet and walk around. He has tested negative for COVID.  Clinical Impression  Pt admitted with above diagnosis.  Pt currently with functional limitations due to the deficits listed below (see PT Problem List). Pt will benefit from skilled PT to increase their independence and safety with mobility to allow discharge to the venue listed below.  Pt lives alone and would benefit from having initial 24 hour A/S until he gets a bit stronger. Pt felt that his daughter could probably provide, but is going to check with her.  He has a good friend who could possibly help.  Pt seemed apprehensive to have A, but encouraged pt to accept help until he could get closer to his PLOF. Recommend HHPT and RW.      Follow Up Recommendations Home health PT;Supervision/Assistance - 24 hour    Equipment Recommendations  Rolling walker with 5" wheels    Recommendations for Other Services       Precautions / Restrictions Precautions Precautions: Fall Restrictions Weight Bearing Restrictions: No      Mobility  Bed Mobility Overal bed mobility: Modified Independent             General bed mobility comments: bed flat without rails  Transfers Overall transfer level: Needs assistance Equipment used: None;Rolling walker (2 wheeled) Transfers: Sit to/from Stand Sit to Stand: Min guard         General transfer comment: stood once without AD in front and once with RW.  He has slow transition and took 2 attempts each time to  achieve standing. Without AD, he displayed slight posterior sway.  Ambulation/Gait Ambulation/Gait assistance: Min guard Gait Distance (Feet): 80 Feet Assistive device: Rolling walker (2 wheeled);IV Pole Gait Pattern/deviations: Decreased step length - right;Decreased step length - left;Narrow base of support;Shuffle Gait velocity: decreased   General Gait Details: Amb in room with IV pole with shuffeled gait pattern and occasional reaching out for UE support with other UE.  Amb in hallway with RW and pt demonstrated longer step length, but continued to have very narrow BOS. Pt verbalized he did feel steadier with RW.  Stairs            Wheelchair Mobility    Modified Rankin (Stroke Patients Only)       Balance Overall balance assessment: History of Falls;Needs assistance   Sitting balance-Leahy Scale: Fair       Standing balance-Leahy Scale: Fair                               Pertinent Vitals/Pain Pain Assessment: No/denies pain    Home Living Family/patient expects to be discharged to:: Private residence Living Arrangements: Alone Available Help at Discharge: Family;Friend(s) Type of Home: House Home Access: Stairs to enter Entrance Stairs-Rails: Left Entrance Stairs-Number of Steps: 4 Home Layout: One level Home Equipment: Cane - quad      Prior Function Level of Independence: Independent         Comments: amb with quad cane occasionally, but not normally  Hand Dominance   Dominant Hand: Right    Extremity/Trunk Assessment   Upper Extremity Assessment Upper Extremity Assessment: Generalized weakness    Lower Extremity Assessment Lower Extremity Assessment: Generalized weakness       Communication   Communication: No difficulties  Cognition Arousal/Alertness: Awake/alert Behavior During Therapy: WFL for tasks assessed/performed Overall Cognitive Status: Within Functional Limits for tasks assessed Area of Impairment:  Safety/judgement                         Safety/Judgement: Decreased awareness of deficits            General Comments      Exercises     Assessment/Plan    PT Assessment Patient needs continued PT services  PT Problem List Decreased strength;Decreased activity tolerance;Decreased balance;Decreased mobility;Decreased knowledge of use of DME       PT Treatment Interventions DME instruction;Gait training;Stair training;Functional mobility training;Balance training;Therapeutic exercise;Therapeutic activities;Patient/family education    PT Goals (Current goals can be found in the Care Plan section)  Acute Rehab PT Goals Patient Stated Goal: home PT Goal Formulation: With patient Time For Goal Achievement: 08/23/19 Potential to Achieve Goals: Good    Frequency Min 3X/week   Barriers to discharge Decreased caregiver support;Inaccessible home environment 4 stairs to enter and lives alone- not interested in any sort of rehab besides HHPT    Co-evaluation               AM-PAC PT "6 Clicks" Mobility  Outcome Measure Help needed turning from your back to your side while in a flat bed without using bedrails?: None Help needed moving from lying on your back to sitting on the side of a flat bed without using bedrails?: None Help needed moving to and from a bed to a chair (including a wheelchair)?: A Little Help needed standing up from a chair using your arms (e.g., wheelchair or bedside chair)?: A Little Help needed to walk in hospital room?: A Little Help needed climbing 3-5 steps with a railing? : A Little 6 Click Score: 20    End of Session Equipment Utilized During Treatment: Gait belt Activity Tolerance: Patient tolerated treatment well Patient left: in chair;with call bell/phone within reach;with chair alarm set Nurse Communication: Mobility status PT Visit Diagnosis: Unsteadiness on feet (R26.81);History of falling (Z91.81);Muscle weakness (generalized)  (M62.81);Difficulty in walking, not elsewhere classified (R26.2)    Time: 9833-8250 PT Time Calculation (min) (ACUTE ONLY): 26 min   Charges:   PT Evaluation $PT Eval Low Complexity: 1 Low PT Treatments $Gait Training: 8-22 mins        Benigno Check L. Katrinka Blazing, [place] Pager [phone] 08/09/2019   Nicholas Gaines 08/09/2019, 10:34 AM

## 2019-08-11 DIAGNOSIS — I69398 Other sequelae of cerebral infarction: Secondary | ICD-10-CM | POA: Diagnosis not present

## 2019-08-11 DIAGNOSIS — K219 Gastro-esophageal reflux disease without esophagitis: Secondary | ICD-10-CM | POA: Diagnosis not present

## 2019-08-11 DIAGNOSIS — G3184 Mild cognitive impairment, so stated: Secondary | ICD-10-CM | POA: Diagnosis not present

## 2019-08-11 DIAGNOSIS — G40209 Localization-related (focal) (partial) symptomatic epilepsy and epileptic syndromes with complex partial seizures, not intractable, without status epilepticus: Secondary | ICD-10-CM | POA: Diagnosis not present

## 2019-08-11 DIAGNOSIS — M1990 Unspecified osteoarthritis, unspecified site: Secondary | ICD-10-CM | POA: Diagnosis not present

## 2019-08-11 DIAGNOSIS — Z7902 Long term (current) use of antithrombotics/antiplatelets: Secondary | ICD-10-CM | POA: Diagnosis not present

## 2019-08-11 DIAGNOSIS — Z7982 Long term (current) use of aspirin: Secondary | ICD-10-CM | POA: Diagnosis not present

## 2019-08-11 DIAGNOSIS — I350 Nonrheumatic aortic (valve) stenosis: Secondary | ICD-10-CM | POA: Diagnosis not present

## 2019-08-11 DIAGNOSIS — I4891 Unspecified atrial fibrillation: Secondary | ICD-10-CM | POA: Diagnosis not present

## 2019-08-11 DIAGNOSIS — I10 Essential (primary) hypertension: Secondary | ICD-10-CM | POA: Diagnosis not present

## 2019-08-11 DIAGNOSIS — E782 Mixed hyperlipidemia: Secondary | ICD-10-CM | POA: Diagnosis not present

## 2019-08-11 DIAGNOSIS — M6281 Muscle weakness (generalized): Secondary | ICD-10-CM | POA: Diagnosis not present

## 2019-09-06 ENCOUNTER — Ambulatory Visit (INDEPENDENT_AMBULATORY_CARE_PROVIDER_SITE_OTHER): Payer: Medicare Other | Admitting: *Deleted

## 2019-09-06 ENCOUNTER — Ambulatory Visit: Payer: Medicare Other | Admitting: Family Medicine

## 2019-09-06 DIAGNOSIS — Z8673 Personal history of transient ischemic attack (TIA), and cerebral infarction without residual deficits: Secondary | ICD-10-CM

## 2019-09-07 LAB — CUP PACEART REMOTE DEVICE CHECK
Date Time Interrogation Session: 20210128172327
Implantable Pulse Generator Implant Date: 20180208

## 2019-09-09 ENCOUNTER — Ambulatory Visit: Payer: Medicare Other

## 2019-09-10 DIAGNOSIS — I69398 Other sequelae of cerebral infarction: Secondary | ICD-10-CM | POA: Diagnosis not present

## 2019-09-10 DIAGNOSIS — M1990 Unspecified osteoarthritis, unspecified site: Secondary | ICD-10-CM | POA: Diagnosis not present

## 2019-09-10 DIAGNOSIS — Z7902 Long term (current) use of antithrombotics/antiplatelets: Secondary | ICD-10-CM | POA: Diagnosis not present

## 2019-09-10 DIAGNOSIS — I350 Nonrheumatic aortic (valve) stenosis: Secondary | ICD-10-CM | POA: Diagnosis not present

## 2019-09-10 DIAGNOSIS — K219 Gastro-esophageal reflux disease without esophagitis: Secondary | ICD-10-CM | POA: Diagnosis not present

## 2019-09-10 DIAGNOSIS — G3184 Mild cognitive impairment, so stated: Secondary | ICD-10-CM | POA: Diagnosis not present

## 2019-09-10 DIAGNOSIS — I4891 Unspecified atrial fibrillation: Secondary | ICD-10-CM | POA: Diagnosis not present

## 2019-09-10 DIAGNOSIS — I10 Essential (primary) hypertension: Secondary | ICD-10-CM | POA: Diagnosis not present

## 2019-09-10 DIAGNOSIS — Z7982 Long term (current) use of aspirin: Secondary | ICD-10-CM | POA: Diagnosis not present

## 2019-09-10 DIAGNOSIS — E782 Mixed hyperlipidemia: Secondary | ICD-10-CM | POA: Diagnosis not present

## 2019-09-10 DIAGNOSIS — M6281 Muscle weakness (generalized): Secondary | ICD-10-CM | POA: Diagnosis not present

## 2019-09-10 DIAGNOSIS — G40209 Localization-related (focal) (partial) symptomatic epilepsy and epileptic syndromes with complex partial seizures, not intractable, without status epilepticus: Secondary | ICD-10-CM | POA: Diagnosis not present

## 2019-09-14 ENCOUNTER — Ambulatory Visit: Payer: Medicare Other

## 2019-09-15 ENCOUNTER — Ambulatory Visit: Payer: Medicare Other | Attending: Internal Medicine

## 2019-09-15 DIAGNOSIS — Z23 Encounter for immunization: Secondary | ICD-10-CM

## 2019-09-15 NOTE — Progress Notes (Signed)
   Covid-19 Vaccination Clinic  Name:  Nicholas Gaines    MRN: [id number] DOB: Jan 05, 1929  09/15/2019  Nicholas Gaines was observed post Covid-19 immunization for 15 minutes without incidence. He was provided with Vaccine Information Sheet and instruction to access the V-Safe system.   Nicholas Gaines was instructed to call 911 with any severe reactions post vaccine: Marland Kitchen Difficulty breathing  . Swelling of your face and throat  . A fast heartbeat  . A bad rash all over your body  . Dizziness and weakness    Immunizations Administered    Name Date Dose VIS Date Route   Pfizer COVID-19 Vaccine 09/15/2019 12:27 PM 0.3 mL 07/20/2019 Intramuscular   Manufacturer: [employer], [employer]   Lot: [id number]   NDC: [address]-5

## 2019-09-24 ENCOUNTER — Telehealth: Payer: Self-pay | Admitting: Family Medicine

## 2019-09-24 NOTE — Telephone Encounter (Signed)
1) Medication(s) Requested (by name): divalproex (DEPAKOTE ER) 250 MG 24 hr tablet  divalproex (DEPAKOTE ER) 500 MG 24 hr tablet   2) Pharmacy of Choice: [facility] [phone] - [place], [place] - [address]  [address], [place] [address]   3) Special Requests:   Patient has no more medication

## 2019-09-25 MED ORDER — DIVALPROEX SODIUM ER 500 MG PO TB24: ORAL_TABLET | ORAL | 1 refills | Status: DC

## 2019-09-25 MED ORDER — DIVALPROEX SODIUM ER 250 MG PO TB24: ORAL_TABLET | ORAL | 1 refills | Status: DC

## 2019-10-05 ENCOUNTER — Telehealth: Payer: Self-pay

## 2019-10-05 NOTE — Telephone Encounter (Signed)
Linq alert received for possible AF episode.  Pt being monitored due to hx of stroke, no documented AF to date.  Pt meds include plavix 75mg  and ASA 325mg  daily.   Pt has remote history of similar episodes, last one was in 2018.   EGM reviewed, episode does not appear to be AF.  Sending to MD for review/ recommendations.

## 2019-10-08 ENCOUNTER — Ambulatory Visit (INDEPENDENT_AMBULATORY_CARE_PROVIDER_SITE_OTHER): Payer: Medicare Other | Admitting: *Deleted

## 2019-10-08 DIAGNOSIS — Z8673 Personal history of transient ischemic attack (TIA), and cerebral infarction without residual deficits: Secondary | ICD-10-CM | POA: Diagnosis not present

## 2019-10-08 LAB — CUP PACEART REMOTE DEVICE CHECK
Date Time Interrogation Session: 20210228232736
Implantable Pulse Generator Implant Date: 20180208

## 2019-10-09 ENCOUNTER — Ambulatory Visit: Payer: Medicare Other | Attending: Internal Medicine

## 2019-10-09 DIAGNOSIS — Z23 Encounter for immunization: Secondary | ICD-10-CM

## 2019-10-09 NOTE — Progress Notes (Signed)
ILR Remote 

## 2019-10-09 NOTE — Progress Notes (Signed)
   Covid-19 Vaccination Clinic  Name:  Nicholas Gaines    MRN: [id number] DOB: Apr 16, 1929  10/09/2019  Nicholas Gaines was observed post Covid-19 immunization for 15 minutes without incident. He was provided with Vaccine Information Sheet and instruction to access the V-Safe system.   Nicholas Gaines was instructed to call 911 with any severe reactions post vaccine: Marland Kitchen Difficulty breathing  . Swelling of face and throat  . A fast heartbeat  . A bad rash all over body  . Dizziness and weakness   Immunizations Administered    Name Date Dose VIS Date Route   Pfizer COVID-19 Vaccine 10/09/2019 11:33 AM 0.3 mL 07/20/2019 Intramuscular   Manufacturer: [employer], [employer]   Lot: [id number]   NDC: [address]-1

## 2019-10-10 ENCOUNTER — Telehealth: Payer: Self-pay | Admitting: *Deleted

## 2019-10-10 NOTE — Telephone Encounter (Signed)
Spoke with patient's daughter, Elita Quick ([place]). She agrees to send a transmission this afternoon. Instructions given and direct DC phone number provided for any questions/concerns. Advised will call back if any true arrhythmias. [name] in agreement with plan, denies questions or additional concerns at this time.

## 2019-10-12 NOTE — Telephone Encounter (Signed)
Spoke with patient's daughter. Advised that manual transmission has not been received. She reports she received an error code that indicated reader needed to charge. She plans to try again this weekend or on Monday. [facility]link tech support number provided for any additional troubleshooting needs. Pt's daughter denies additional questions at this time.

## 2019-10-16 NOTE — Telephone Encounter (Signed)
The pt daughter tried to send a manual transmission but was unsuccessful. She called [facility] tech support and they are sending a new monitor in 5-7 business days.

## 2019-10-22 NOTE — Telephone Encounter (Signed)
Spoke with pt daughter, he has received new remote monitoring device however she has not set up ye.  She is not at home now but will set up and send manual transmission when she gets home.

## 2019-10-25 NOTE — Telephone Encounter (Signed)
Full transmission received 10-23-2019

## 2019-10-26 NOTE — Telephone Encounter (Signed)
Full report reviewed. All available "AF" ECGs appear SR w/PACs. No true AF noted.

## 2019-10-29 ENCOUNTER — Telehealth: Payer: Self-pay

## 2019-10-29 NOTE — Telephone Encounter (Signed)
Linq transmission received.  It appears there were 5 new "AF" episodes without details.  Need manual transmission.   Attempted to reach pt daughter, Elita Quick.  No answer, left detailed message on VM.

## 2019-10-29 NOTE — Telephone Encounter (Signed)
The pt daughter Elita Quick states it will be tomorrow afternoon when she will be able to send the transmission.

## 2019-10-30 NOTE — Telephone Encounter (Signed)
Manual transmission received.  Episodes noted do not appear to be true AF episodes similar to false AF episodes in the past.  Will have scheduling contact pt to schedule in clinic device check for reprogramming

## 2019-11-08 ENCOUNTER — Ambulatory Visit (INDEPENDENT_AMBULATORY_CARE_PROVIDER_SITE_OTHER): Payer: Medicare Other | Admitting: *Deleted

## 2019-11-08 DIAGNOSIS — Z8673 Personal history of transient ischemic attack (TIA), and cerebral infarction without residual deficits: Secondary | ICD-10-CM | POA: Diagnosis not present

## 2019-11-08 LAB — CUP PACEART REMOTE DEVICE CHECK
Date Time Interrogation Session: 20210401000500
Date Time Interrogation Session: 20210401003110
Implantable Pulse Generator Implant Date: 20180208
Implantable Pulse Generator Implant Date: 20180208

## 2019-11-08 NOTE — Progress Notes (Signed)
ILR Remote 

## 2019-11-11 NOTE — Telephone Encounter (Signed)
Likely sinus with PACs, though I cannot completely exclude afib.  Continue to monitor.

## 2019-11-15 ENCOUNTER — Encounter: Payer: Self-pay | Admitting: Family Medicine

## 2019-11-15 ENCOUNTER — Other Ambulatory Visit: Payer: Self-pay

## 2019-11-15 ENCOUNTER — Ambulatory Visit: Payer: Medicare Other | Admitting: Family Medicine

## 2019-11-15 VITALS — BP 124/81 | HR 73 | Temp 97.4°F | Ht 65.0 in | Wt 145.0 lb

## 2019-11-15 DIAGNOSIS — R569 Unspecified convulsions: Secondary | ICD-10-CM | POA: Diagnosis not present

## 2019-11-15 DIAGNOSIS — I6930 Unspecified sequelae of cerebral infarction: Secondary | ICD-10-CM | POA: Diagnosis not present

## 2019-11-15 DIAGNOSIS — G3184 Mild cognitive impairment, so stated: Secondary | ICD-10-CM | POA: Diagnosis not present

## 2019-11-15 DIAGNOSIS — Z820 Family history of epilepsy and other diseases of the nervous system: Secondary | ICD-10-CM | POA: Diagnosis not present

## 2019-11-15 DIAGNOSIS — R2689 Other abnormalities of gait and mobility: Secondary | ICD-10-CM

## 2019-11-15 DIAGNOSIS — R251 Tremor, unspecified: Secondary | ICD-10-CM

## 2019-11-15 MED ORDER — DIVALPROEX SODIUM ER 250 MG PO TB24: ORAL_TABLET | ORAL | 3 refills | Status: DC

## 2019-11-15 MED ORDER — DIVALPROEX SODIUM ER 500 MG PO TB24: ORAL_TABLET | ORAL | 3 refills | Status: DC

## 2019-11-15 NOTE — Patient Instructions (Addendum)
We will continue divalproex 750 mg daily for seizure prevention  Stay well hydrated. Continue close follow up with PCP   Follow up in 3-6 months    Seizure, Adult A seizure is a sudden burst of abnormal electrical activity in the brain. Seizures usually last from 30 seconds to 2 minutes. They can cause many different symptoms. Usually, seizures are not harmful unless they last a long time. What are the causes? Common causes of this condition include:  Fever or infection.  Conditions that affect the brain, such as: ? A brain abnormality that you were born with. ? A brain or head injury. ? Bleeding in the brain. ? A tumor. ? Stroke. ? Brain disorders such as autism or cerebral palsy.  Low blood sugar.  Conditions that are passed from parent to child (are inherited).  Problems with substances, such as: ? Having a reaction to a drug or a medicine. ? Suddenly stopping the use of a substance (withdrawal). In some cases, the cause may not be known. A person who has repeated seizures over time without a clear cause has a condition called epilepsy. What increases the risk? You are more likely to get this condition if you have:  A family history of epilepsy.  Had a seizure in the past.  A brain disorder.  A history of head injury, lack of oxygen at birth, or strokes. What are the signs or symptoms? There are many types of seizures. The symptoms vary depending on the type of seizure you have. Examples of symptoms during a seizure include:  Shaking (convulsions).  Stiffness in the body.  Passing out (losing consciousness).  Head nodding.  Staring.  Not responding to sound or touch.  Loss of bladder control and bowel control. Some people have symptoms right before and right after a seizure happens. Symptoms before a seizure may include:  Fear.  Worry (anxiety).  Feeling like you may vomit (nauseous).  Feeling like the room is spinning (vertigo).  Feeling like  you saw or heard something before (dj vu).  Odd tastes or smells.  Changes in how you see. You may see flashing lights or spots. Symptoms after a seizure happens can include:  Confusion.  Sleepiness.  Headache.  Weakness on one side of the body. How is this treated? Most seizures will stop on their own in under 5 minutes. In these cases, no treatment is needed. Seizures that last longer than 5 minutes will usually need treatment. Treatment can include:  Medicines given through an IV tube.  Avoiding things that are known to cause your seizures. These can include medicines that you take for another condition.  Medicines to treat epilepsy.  Surgery to stop the seizures. This may be needed if medicines do not help. Follow these instructions at home: Medicines  Take over-the-counter and prescription medicines only as told by your doctor.  Do not eat or drink anything that may keep your medicine from working, such as alcohol. Activity  Do not do any activities that would be dangerous if you had another seizure, like driving or swimming. Wait until your doctor says it is safe for you to do them.  If you live in the [place] ask your local DMV (department of motor vehicles) when you can drive.  Get plenty of rest. Teaching others Teach friends and family what to do when you have a seizure. They should:  Lay you on the ground.  Protect your head and body.  Loosen any tight clothing around your  neck.  Turn you on your side.  Not hold you down.  Not put anything into your mouth.  Know whether or not you need emergency care.  Stay with you until you are better.  General instructions  Contact your doctor each time you have a seizure.  Avoid anything that gives you seizures.  Keep a seizure diary. Write down: ? What you think caused each seizure. ? What you remember about each seizure.  Keep all follow-up visits as told by your doctor. This is important. Contact a  doctor if:  You have another seizure.  You have seizures more often.  There is any change in what happens during your seizures.  You keep having seizures with treatment.  You have symptoms of being sick or having an infection. Get help right away if:  You have a seizure that: ? Lasts longer than 5 minutes. ? Is different than seizures you had before. ? Makes it harder to breathe. ? Happens after you hurt your head.  You have any of these symptoms after a seizure: ? Not being able to speak. ? Not being able to use a part of your body. ? Confusion. ? A bad headache.  You have two or more seizures in a row.  You do not wake up right after a seizure.  You get hurt during a seizure. These symptoms may be an emergency. Do not wait to see if the symptoms will go away. Get medical help right away. Call your local emergency services (911 in the [place] Do not drive yourself to the hospital. Summary  Seizures usually last from 30 seconds to 2 minutes. Usually, they are not harmful unless they last a long time.  Do not eat or drink anything that may keep your medicine from working, such as alcohol.  Teach friends and family what to do when you have a seizure.  Contact your doctor each time you have a seizure. This information is not intended to replace advice given to you by your health care provider. Make sure you discuss any questions you have with your health care provider. Document Revised: 10/13/2018 Document Reviewed: 10/13/2018 Elsevier Patient Education  [place]; Levodopa tablets What is this medicine? CARBIDOPA;LEVODOPA (kar bi DOE pa; lee voe DOE pa) is used to treat the symptoms of Parkinson's disease. This medicine may be used for other purposes; ask your health care provider or pharmacist if you have questions. COMMON BRAND NAME(S): Atamet, SINEMET What should I tell my health care provider before I take this medicine? They need to know if  you have any of these conditions:  depression or other mental illness  diabetes  glaucoma  heart disease, including history of a heart attack  history of irregular heartbeat  kidney disease  liver disease  lung or breathing disease, like asthma  narcolepsy  sleep apnea  stomach or intestine problems  an unusual or allergic reaction to levodopa, carbidopa, other medicines, foods, dyes, or preservatives  pregnant or trying to get pregnant  breast-feeding How should I use this medicine? Take this medicine by mouth with a glass of water. Follow the directions on the prescription label. Take your doses at regular intervals. Do not take your medicine more often than directed. Do not stop taking except on the advice of your doctor or health care professional. Talk to your pediatrician regarding the use of this medicine in children. Special care may be needed. Overdosage: If you think you have taken too  much of this medicine contact a poison control center or emergency room at once. NOTE: This medicine is only for you. Do not share this medicine with others. What if I miss a dose? If you miss a dose, take it as soon as you can. If it is almost time for your next dose, take only that dose. Do not take double or extra doses. What may interact with this medicine? Do not take this medicine with any of the following medications:  MAOIs like Marplan, Nardil, and Parnate  reserpine  tetrabenazine This medicine may also interact with the following medications:  alcohol  droperidol  entacapone  iron supplements or multivitamins with iron  isoniazid, INH  linezolid  medicines for depression, anxiety, or psychotic disturbances  medicines for high blood pressure  medicines for sleep  metoclopramide  papaverine  procarbazine  tedizolid  rasagiline  selegiline  tolcapone This list may not describe all possible interactions. Give your health care provider a list  of all the medicines, herbs, non-prescription drugs, or dietary supplements you use. Also tell them if you smoke, drink alcohol, or use illegal drugs. Some items may interact with your medicine. What should I watch for while using this medicine? Visit your health care professional for regular checks on your progress. Tell your health care professional if your symptoms do not start to get better or if they get worse. Do not stop taking except on your health care professional's advice. You may develop a severe reaction. Your health care professional will tell you how much medicine to take. You may experience a wearing of effect prior to the time for your next dose of this medicine. You may also experience an on-off effect where the medicine apparently stops working for anything from a minute to several hours, then suddenly starts working again. Tell your doctor or health care professional if any of these symptoms happen to you. Your dose may need to be changed. A high protein diet can slow or prevent absorption of this medicine. Avoid high protein foods near the time of taking this medicine to help to prevent these problems. Take this medicine at least 30 minutes before eating or one hour after meals. You may want to eat higher protein foods later in the day or in small amounts. Discuss your diet with your doctor or health care professional or nutritionist. You may get drowsy or dizzy. Do not drive, use machinery, or do anything that needs mental alertness until you know how this drug affects you. Do not stand or sit up quickly, especially if you are an older patient. This reduces the risk of dizzy or fainting spells. Alcohol may interfere with the effect of this medicine. Avoid alcoholic drinks. When taking this medicine, you may fall asleep without notice. You may be doing activities like driving a car, talking, or eating. You may not feel drowsy before it happens. Contact your health care provider right away  if this happens to you. There have been reports of increased sexual urges or other strong urges such as gambling while taking this medicine. If you experience any of these while taking this medicine, you should report this to your health care provider as soon as possible. If you are diabetic, this medicine may interfere with the accuracy of some tests for sugar or ketones in the urine (does not interfere with blood tests). Check with your doctor or health care professional before changing the dose of your diabetic medicine. This medicine may discolor the urine  or sweat, making it look darker or red in color. This is of no cause for concern. However, this may stain clothing or fabrics. This medicine may cause a decrease in vitamin B6. You should make sure that you get enough vitamin B6 while you are taking this medicine. Discuss the foods you eat and the vitamins you take with your health care professional. What side effects may I notice from receiving this medicine? Side effects that you should report to your doctor or health care professional as soon as possible:  allergic reactions like skin rash, itching or hives, swelling of the face, lips, or tongue  changes in emotions or moods  falling asleep during normal activities like driving  fast, irregular heartbeat  feeling faint or lightheaded, falls  fever  hallucinations  new or increased gambling urges, sexual urges, uncontrolled spending, binge or compulsive eating, or other urges  stomach pain  trouble passing urine or change in the amount of urine  uncontrollable movements of the arms, face, head, mouth, neck, or upper body Side effects that usually do not require medical attention (report to your doctor or health care professional if they continue or are bothersome):  dizziness  headache  loss of appetite  nausea  trouble sleeping This list may not describe all possible side effects. Call your doctor for medical advice  about side effects. You may report side effects to FDA at 1-800-FDA-1088. Where should I keep my medicine? Keep out of the reach of children. Store at room temperature between 15 and 30 degrees C (59 and 86 degrees F). Protect from light. Throw away any unused medicine after the expiration date. NOTE: This sheet is a summary. It may not cover all possible information. If you have questions about this medicine, talk to your doctor, pharmacist, or health care provider.  2020 Elsevier/Gold Standard (2019-04-02 19:49:59)    Parkinson's Disease Parkinson's disease causes problems with movements. It is a long-term condition. It gets worse over time (is progressive). It affects each person in different ways. It makes it harder for you to:  Control how your body moves.  Move your body normally. The condition can range from mild to very bad (advanced). What are the causes? This condition results from a loss of brain cells called neurons. These brain cells make a chemical called dopamine, which is needed to control body movement. As the condition gets worse, the brain cells make less dopamine. This makes it hard to move or control your movements. The exact cause of this condition is not known. What increases the risk?  Being male.  Being age 108 or older.  Having family members who had Parkinson's disease.  Having had an injury to the brain.  Being very sad (depressed).  Being around things that are harmful or poisonous. What are the signs or symptoms? Symptoms of this condition can vary. The main symptoms have to do with movement. These include:  A tremor or shaking while you are resting that you cannot control.  Stiffness in your neck, arms, and legs.  Slowing of movement. This may include: ? Losing expressions of the face. ? Having trouble making small movements that are needed to button your clothing or brush your teeth.  Walking in a way that is not normal. You may walk with  short, shuffling steps.  Loss of balance when standing. You may sway, fall backward, or have trouble making turns. Other symptoms include:  Being very sad, worried, or confused.  Seeing or hearing things  that are not real.  Losing thinking abilities (dementia).  Trouble speaking or swallowing.  Having a hard time pooping (constipation).  Needing to pee right away, peeing often, or not being able to control when you pee or poop.  Sleep problems. How is this treated? There is no cure. The goal of treatment is to manage your symptoms. Treatment may include:  Medicines.  Therapy to help with talking or movement.  Surgery to reduce shaking and other movements that you cannot control. Follow these instructions at home: Medicines  Take over-the-counter and prescription medicines only as told by your doctor.  Avoid taking pain or sleeping medicines. Eating and drinking  Follow instructions from your doctor about what you cannot eat or drink.  Do not drink alcohol. Activity  Talk with your doctor about if it is safe for you to drive.  Do exercises as told by your doctor. Lifestyle      Put in grab bars and railings in your home. These help to prevent falls.  Do not use any products that contain nicotine or tobacco, such as cigarettes, e-cigarettes, and chewing tobacco. If you need help quitting, ask your doctor.  Join a support group. General instructions  Talk with your doctor about what you need help with and what your safety needs are.  Keep all follow-up visits as told by your doctor, including any therapy visits to help with talking or moving. This is important. Contact a doctor if:  Medicines do not help your symptoms.  You feel off-balance.  You fall at home.  You need more help at home.  You have trouble swallowing.  You have a very hard time pooping.  You have a lot of side effects from your medicines.  You feel very sad, worried, or  confused. Get help right away if:  You were hurt in a fall.  You see or hear things that are not real.  You cannot swallow without choking.  You have chest pain or trouble breathing.  You do not feel safe at home.  You have thoughts about hurting yourself or others. If you ever feel like you may hurt yourself or others, or have thoughts about taking your own life, get help right away. You can go to your nearest emergency department or call:  Your local emergency services (911 in the [place]  A suicide crisis helpline, such as the National Suicide Prevention Lifeline at (703)340-5126. This is open 24 hours a day. Summary  This condition causes problems with movements.  It is a long-term condition. It gets worse over time.  There is no cure. Treatment focuses on managing your symptoms.  Talk with your doctor about what you need help with and what your safety needs are.  Keep all follow-up visits as told by your doctor. This is important. This information is not intended to replace advice given to you by your health care provider. Make sure you discuss any questions you have with your health care provider. Document Revised: 10/12/2018 Document Reviewed: 10/12/2018 Elsevier Patient Education  2020 [employer].

## 2019-11-15 NOTE — Progress Notes (Addendum)
PATIENT: Nicholas Gaines DOB: [birth year]  REASON FOR VISIT: follow up HISTORY FROM: patient  Chief Complaint  Patient presents with  . Follow-up    rm 6, with daughter, seizures, hx of stroke , pt fell last week      HISTORY OF PRESENT ILLNESS: Today 11/15/19 Nicholas Gaines is a 86 y.o. male here today for follow up. He was admitted to the hospital in 07/2019 for generalized weakness. Note report this was multifactorial but noted dehydration. He had a sore throat in the weeks prior that may have decreased fluid intake. He was given fluids and started on home PT. He completed therapy but did not feel that it helped much at all. He is able to walk outside with his walker. He did have a fall last week. He had walked out to his mailbox and his legs felt weak. He reports getting off balance and fell. No injuries. He sleeps off and on throughout the day. Family plans to get an in home caregiver and put in security cameras. Daughter reports that he probably doesn't eat and drink like he should. He eats two decent meals each day. Sometimes three. He drinks about 2-32oz Gatorades or water every day. No trouble swallowing. Weight is stable. He has been vaccinated. Daughter reports a short period of time following the second vaccination where he was unable to talk. He was having a difficult time getting words out for about 1-2 hours about 5 hours after getting vaccine. She was very tired as well. He remembers the event but his daughter states that this is his typical response during a seizure. She feels that symptoms were very mild in comparison to his typical seizure. He continues divalproex 750mg  daily. He does not usually miss doses. Daughter calls to remind him to take medications but he is able to get them out of his pill organizer. He does continue to note a tremor of the left hand. It waxes and wanes.   We have discussed considering Sinemet for possible parkinson's disease based on symptoms and mother  having PD. He is very hesitant to add medications as he had significant side effects with previous antiepileptics. He does not feel DaT scan is necessary. He does endorse some mild depression as well but does not feel it is bad enough to consider a medication for treatment. He feels that he is able to rely on his faith when he is anxious or depressed.   HISTORY: (copied from my note on 06/04/2019)  Nicholas Gaines is a 86 y.o. male here today for follow up for seizure, MCI and history of CVA. He continues Depakote 750mg  daily. He has not had any seizure like activity since last being seen. His daughter, 98, reports that he is slower and has had some weight fluctuations. He has lost about 8 pounds over the past year. He feels that he is not eating as much. He cooks breakfast. Usually eats a frozen dinner or sandwich for lunch and dinner. No trouble swallowing or speaking. Gait is shuffled. He leans forward when walking. No assistive device. No falls. No dizziness. He is sleeping more. He feels more tired than normal but tries to stay active. He has noticed more of a tremor but is uncertain if it is worse with activity. He has contributed this to Depakote side effect.  He lives alone. He does not drive. He goes to church with friends and neighbors. He tries to stay busy. He continues aspirin 325mg  and Plavix. Mother died  with Parkinson's.    HISTORY: (copied from  note on 05/29/2018)  Interval history 05/29/2018: He had formal neurocognitive testing with Dr. Alinda DoomsBailar and diagnosed Mild Cognitive Impairment. He feels well on the Depakote. He has daytime sleepiness and loose stools with the Depakote. He is on 750mg  a day. He has had 2 seizures in the last 6 months, they do not want to increase his Depakote. He does not drive. Discussed mood, he may have some adjustment disorder. His mood is "liveable" but he doesn't feel depressed or that he needs some help with mood.Here with his daughter, he is doing well,  Depakote has some side effects but has been the best AED we have tried will continue with this. He goes to church.Discussed formal neurocognitive testing: MCI.  Reviewed imaging with patient/daughter. I agree with below :  IMPRESSION: This MRI of the brain without contrast shows the following: 1. Moderate generalized cortical atrophy, more pronounced in the mesial temporal lobes. The extent of atrophy has progressed noticeably since the 06/22/2016 MRI. 2. Moderate chronic microvascular ischemic change, slightly progressed when compared to the 2017 MRI. 3. There are no acute findings.  IMPRESSION: This MRI of the cervical spine without contrast shows the following: 1. The spinal cord appears normal. There is no spinal stenosis 2. At C2-C3, there is severe loss of disc height and uncovertebral spurring causing moderately severe bilateral foraminal narrowing that could lead to C3 nerve root compression to either side. 3. At C3-C4, there are degenerative changes causing moderately severe left foraminal narrowing with potential for left C4 nerve root compression. 4. At C5-C6, there are degenerative changes and severe reduced disc height causing moderately severe right foraminal narrowing with potential for right C6 nerve root compression. 5. There are moderate degenerative changes at the other cervical levels with less potential for nerve root compression  Interval history 11/16/2017: Patient had diarrhea to Depakote and he was changed to 500mg  ER daily. He is here with his daughter. Diarrhea went away. Doing well on the extended release. He has had 2 breakthrough seizures. He has been having more good days. Some days he feels like running, he is working in the garden. His speech is a lot better. Has had 2 breakthrough seizures so will increase to 750mg  day. He feels his walking is better. No falls. He has been to physical therapy or balance. He has arthritis in his hips  and he drags his feet. No neck pain, he has disk in his back that gives him problems. But he still has imbalance. He still feels he has problems with memory. Daughter provides information. Daughter says he is doing better.   Interval history 02/16/2018:They are on vimpat twice a day 100mg . Having side effects from Vimpat. His balance is not as good as it was. He was fatigued with the Keppra but he has a decline with vimpat, reaction time is very slow, remembering how to work a cell phone. Daughter has to use manage his pills and his he has word-finding difficulty. They are concerned about quality of life. Decline din the last 12 months since the seizures. He fell three times when on Vimpat. He is not driving. He lives by himself bit he has first alert. Daughter lives 10 minutes away. His mother had dementia she passed away at 287. He has more short-term memory loss. Discussed risks of coming off of medication, seizures can cause significant morbidity and mortality. They still decline any more medications at this time. He has difficulty remembering  names and words. We can take him off of the medication and then review again. Denies depression. Will decrease medication and then have him come back in 8 weeks for dementia evaluation. No appetite.   Reviewed CT scan of the head and agree with the following: FINDINGS: Brain: There is generalized age related parenchymal atrophy with commensurate dilatation of the ventricles and sulci. Chronic small vessel ischemic changes noted within the periventricular and subcortical white matter regions bilaterally. Additional small old lacunar infarcts noted within each basal ganglia region.  There is no mass, hemorrhage, edema or other evidence of acute parenchymal abnormality. No extra-axial hemorrhage.  Vascular: There are chronic calcified atherosclerotic changes of the large vessels at the skull base. No unexpected hyperdense vessel.  Skull: Normal. Negative for  fracture or focal lesion.  Sinuses/Orbits: No acute finding.  Other: None.  IMPRESSION: 1. No acute findings. No intracranial mass, hemorrhage or edema. 2. Atrophy and chronic ischemic changes as detailed above.  Cbc with anemia, Cmp with slightly dec GFR but largely unremarkable   Nicholas Thompsonis a 87 y.o.malehere as a referral from Dr. Helyn Numbers seizures.Past medical history of atrial fibrillation, heart murmur, stroke in 2002, esophageal stricture,hyperlipidemia, TIA, prediabetic.Heis on aspirin 325 and Plavix 75. Patienthashad several episodes of staring, confusion, disorientation and unintelligible speech. Usually back to baseline within a half hour. No distinct recollection of the events and only slightly before and after them. No weakness or numbness or incoordination or gait disturbances. No headaches. Episodes happened when he was feeling well but also in the setting of fevers and cough and respiratory infection.The first episode was in November, he was fishing and he tried to talk and couldn't get his words out, lasted 15 minutes, he could talk when he got home. Daughter is here and provides much information, she saw him later that evening and it was fine. He denies any other associated symptoms such as weakness or confusion or vision changes. His friend in the car noticed that there was something wrong, no LOC, no urination or abnormal movements, he was in his usual state of health at the time no new medications or anything new but he was starting to feel ill at the time. Next episode was on Christmas eve in the setting of illness, daughter was calling him and he did not answer and he was sitting in the car with his wet clothes on from vomiting and he was asleep in the car, he was weak, he was running a fever of 102 and he was not taken to the hospital. On Christmas day he didn't recognize family, he was speaking in "word salad" and he was aimlessly wandering  around the house and looking into space and not saying anything, couldn;t tell his daughter's name. No events since being discharged from the hospital.No current focal neurologic deficits, other associated symptoms or modifiable factors. He has not had any more incidents since starting AEDs, was started on trileptal in the hospital.  Reviewed notes, labs and imaging from outside physicians, which showed:  LDL 69, BUN 19, creatinine 1.1, A1c 5.6 08/03/2016. TSH 1.9 11/14/2015.  Patient presented to the emergency room on 07/03/2016 with fever, cough and episodic confusion. He said 2 episodes in the last several days where he had become discretely confused. Patient had a TIA in November diagnosed by primary care. He had been having on and off fevers, shortness of breath, cough and was recently started on antibiotics. Patient was unable to speak during his TIA, he remembered  trying to speak and not being able to. He had a outpatient workup for TIA with his PCP that showed MRI with old stroke in 2002 and is getting loose recorder for paroxysmal A. fib. Episodes of confusion were described as disorientation and staring with associated difficulty speaking. Episodes lasted 30-60 minutes then resolved spontaneously. Patient did not have any weakness, numbness, vision or hearing loss. Episodes of happened in the setting of temperature and cough but also happen when patient was feeling well. On admission he was found to have elevated white blood cells 13, CT of the head was negative for acute intracranial abnormalities and neurology was consulted. Neurology felt that these were suggestive of complex partial seizures and not TIAs. Routine EEG was recommended for outpatient 72 hour ambulatory EEG.  Personally reviewed images and agree with the following: MRI brain 06/22/2016:1. No acute intracranial abnormality. 2. Mild chronic microvascular ischemic changes and parenchymal volume loss for age. Few scattered  punctate foci of chronic microhemorrhage are nonspecific but also likely related to microvascular ischemic changes.   REVIEW OF SYSTEMS: Out of a complete 14 system review of symptoms, the patient complains only of the following symptoms, tremor, imbalance, depression, fatigue and all other reviewed systems are negative.  ALLERGIES: Allergies  Allergen Reactions  . Rocephin [Ceftriaxone Sodium In Dextrose] Other (See Comments)    C-Diff  . Atorvastatin     Other reaction(s): myalgia  . Pravastatin     Other reaction(s): myalgia  . Rosuvastatin Calcium     Other reaction(s): fatigued    HOME MEDICATIONS: Outpatient Medications Prior to Visit  Medication Sig Dispense Refill  . acetaminophen (TYLENOL) 500 MG tablet Take 650 mg by mouth every 8 (eight) hours as needed (pain).     Marland Kitchen aspirin EC 325 MG tablet Take 325 mg by mouth daily.    . Cholecalciferol 1000 units capsule Take 1,000 Units by mouth daily.     . clopidogrel (PLAVIX) 75 MG tablet Take 75 mg by mouth daily.   0  . famotidine (PEPCID) 20 MG tablet Take 20 mg by mouth daily.     . ferrous sulfate 325 (65 FE) MG EC tablet Take 325 mg by mouth daily with breakfast.    . ibuprofen (ADVIL,MOTRIN) 200 MG tablet Take 600 mg by mouth daily as needed for headache or moderate pain.     Marland Kitchen LYCOPENE PO Take 10 mg by mouth daily.     . Multiple Vitamins-Minerals (ICAPS) CAPS Take 1 capsule by mouth daily.    . Omega-3 Fatty Acids (FISH OIL) 1000 MG CAPS Take 2,000 mg by mouth daily.    . Saw Palmetto 450 MG CAPS Take 900 mg by mouth daily.     . vitamin B-12 (CYANOCOBALAMIN) 1000 MCG tablet Take 1,000 mcg by mouth daily.    Marland Kitchen ZETIA 10 MG tablet Take 10 mg by mouth daily.   0  . divalproex (DEPAKOTE ER) 250 MG 24 hr tablet TAKE 1 TABLET BY MOUTH EVERY DAY WITH 500MG  TABLET FOR A TOTAL OF 750MG  30 tablet 1  . divalproex (DEPAKOTE ER) 500 MG 24 hr tablet TAKE 1 TABLET BY MOUTH EVERY DAY WITH 250MG  TABLET FOR TOTAL OF 750MG  30 tablet 1    No facility-administered medications prior to visit.    PAST MEDICAL HISTORY: Past Medical History:  Diagnosis Date  . A-fib (HCC)   . Arthritis   . GERD (gastroesophageal reflux disease)   . Heart murmur   . HLD (hyperlipidemia)   .  Seizures ([place])    last sz 04/17/17  . Stroke [facility])    tia    PAST SURGICAL HISTORY: Past Surgical History:  Procedure Laterality Date  . CARPAL TUNNEL RELEASE     About 10 yr ago (08/13/16)  . LOOP RECORDER INSERTION N/A 09/16/2016   Procedure: Loop Recorder Insertion;  Surgeon: Clinch Grayer, MD;  Location: [place] [facility];  Service: Cardiovascular;  Laterality: N/A;  . TONSILLECTOMY      FAMILY HISTORY: Family History  Problem Relation Age of Onset  . Hypertension Mother   . Parkinson's disease Mother   . Heart attack Mother   . Stroke Sister   . Lymphoma Sister   . Stroke Brother   . CVA Father   . Stroke Paternal Grandmother        in her early 15's  . Stroke Paternal Grandfather        in his early 73's  . Pulmonary embolism Sister        04/2018  . Diabetes Neg Hx     SOCIAL HISTORY: Social History   Socioeconomic History  . Marital status: Widowed    Spouse name: Not on file  . Number of children: 1  . Years of education: 27  . Highest education level: Not on file  Occupational History  . Occupation: Retired  Tobacco Use  . Smoking status: Never Smoker  . Smokeless tobacco: Never Used  Substance and Sexual Activity  . Alcohol use: No    Alcohol/week: 0.0 standard drinks  . Drug use: No  . Sexual activity: Not on file  Other Topics Concern  . Not on file  Social History Narrative   04/19/17 Lives alone    Caffeine use: cup of coffee daily   Right handed   Social Determinants of Health   Financial Resource Strain:   . Difficulty of Paying Living Expenses:   Food Insecurity:   . Worried About Charity fundraiser in the Last Year:   . Arboriculturist in the Last Year:   Transportation Needs:   . Lexicographer (Medical):   Marland Kitchen Lack of Transportation (Non-Medical):   Physical Activity:   . Days of Exercise per Week:   . Minutes of Exercise per Session:   Stress:   . Feeling of Stress :   Social Connections:   . Frequency of Communication with Friends and Family:   . Frequency of Social Gatherings with Friends and Family:   . Attends Religious Services:   . Active Member of Clubs or Organizations:   . Attends Archivist Meetings:   Marland Kitchen Marital Status:   Intimate Partner Violence:   . Fear of Current or Ex-Partner:   . Emotionally Abused:   Marland Kitchen Physically Abused:   . Sexually Abused:       PHYSICAL EXAM  Vitals:   11/15/19 1126  BP: 124/81  Pulse: 73  Temp: (!) 97.4 F (36.3 C)  Weight: 145 lb (65.8 kg)  Height: 5\' 5"  (1.651 m)   Body mass index is 24.13 kg/m.  Generalized: Well developed, in no acute distress  Cardiology: normal rate and rhythm, holosystolic murmur  Neurological examination  Mentation: Alert oriented to time, place, history taking. Follows all commands speech and language fluent Cranial nerve II-XII: Pupils were equal round reactive to light. Extraocular movements were full, visual field were full on confrontational test. Head turning and shoulder shrug  were normal and symmetric. Motor: The motor testing reveals  5 over 5 strength of all 4 extremities. Good symmetric motor tone is noted throughout. Bradykinesia noted with finger taps, dysthymic toe taps, cogwheel rigidity noted bilaterally, mild left hand tremor  Sensory: Sensory testing is intact to soft touch on all 4 extremities. No evidence of extinction is noted.  Coordination: Cerebellar testing reveals good finger-nose-finger and heel-to-shin bilaterally.  Gait and station: not assessed today as patient is in a wheelchair, he is able to walk short distances at home. Daughter reports gait is short and shuffled steps with stooped posture  Reflexes: Deep tendon reflexes are symmetric and  normal bilaterally.   DIAGNOSTIC DATA (LABS, IMAGING, TESTING) - I reviewed patient records, labs, notes, testing and imaging myself where available.  MMSE - Mini Mental State Exam 06/04/2019 11/16/2017  Orientation to time 5 5  Orientation to Place 5 3  Registration 3 3  Attention/ Calculation 1 3  Recall 2 2  Language- name 2 objects 2 2  Language- repeat 0 1  Language- follow 3 step command 3 3  Language- read & follow direction 1 1  Write a sentence 1 1  Copy design 0 0  Copy design-comments Named 10 animals -  Total score 23 24     Lab Results  Component Value Date   WBC 8.1 08/09/2019   HGB 10.0 (L) 08/09/2019   HCT 30.9 (L) 08/09/2019   MCV 96.6 08/09/2019   PLT 162 08/09/2019      Component Value Date/Time   NA 142 08/09/2019 0530   NA 147 (H) 06/04/2019 1458   K 4.2 08/09/2019 0530   CL 104 08/09/2019 0530   CO2 28 08/09/2019 0530   GLUCOSE 101 (H) 08/09/2019 0530   BUN 11 08/09/2019 0530   BUN 21 06/04/2019 1458   CREATININE 0.93 08/09/2019 0530   CALCIUM 9.1 08/09/2019 0530   PROT 5.9 (L) 08/09/2019 0530   PROT 6.5 06/04/2019 1458   ALBUMIN 3.1 (L) 08/09/2019 0530   ALBUMIN 4.4 06/04/2019 1458   AST 21 08/09/2019 0530   ALT 10 08/09/2019 0530   ALKPHOS 83 08/09/2019 0530   BILITOT 0.8 08/09/2019 0530   BILITOT <0.2 06/04/2019 1458   GFRNONAA >60 08/09/2019 0530   GFRAA >60 08/09/2019 0530   Lab Results  Component Value Date   CHOL 120 08/03/2016   HDL 37 (L) 08/03/2016   LDLCALC 69 08/03/2016   TRIG 71 08/03/2016   CHOLHDL 3.2 08/03/2016   Lab Results  Component Value Date   HGBA1C 5.6 08/03/2016   Lab Results  Component Value Date   VITAMINB12 >2000 (H) 06/04/2019   Lab Results  Component Value Date   TSH 5.645 (H) 08/09/2019     ASSESSMENT AND PLAN 86 y.o. year old male  has a past medical history of A-fib (HCC), Arthritis, GERD (gastroesophageal reflux disease), Heart murmur, HLD (hyperlipidemia), Seizures (HCC), and Stroke  (HCC). here with     ICD-10-CM   1. Seizures (HCC)  R56.9   2. Family history of Parkinson disease  Z82.0   3. Mild cognitive impairment  G31.84   4. H/O: stroke with residual effects  I69.30   5. Shuffling gait  R26.89   6. Tremor  R25.1     Mr Rosevear continues to have difficulty with fatigue, imbalance, and tremor. We have discussed concerns of Parkinson's Disease based on history, exam findings and family history. He does not wish to have a DaT scan at this time. We have discussed trying Sinemet but he wishes  to think about this. Information regarding Parkinsons and Sinemet given for review. He may call should he wish to try Sinemet. I will anticipate starting 25-100mg  twice daily to evaluate tolerability and response. We have also discussed concerns of depression. He does not feel depression is significant enough at this time to consider prescription medications. He was encouraged to monitor symptoms closely and reach out to me or PCP should depression worsen. He will continue divalproex. This could likely be related to tremor. He has tried and failed multiple antiepileptics in the past and wishes to continue divalproex. He was encouraged to continue focusing on adequate hydration and eating regular meals. He will follow up closely with PCP and cardiology as directed for management of co morbidities and stroke prevention. We will follow up in 3-6 months. He verbalizes understanding and agreement with this plan.    No orders of the defined types were placed in this encounter.    Meds ordered this encounter  Medications  . divalproex (DEPAKOTE ER) 500 MG 24 hr tablet    Sig: TAKE 1 TABLET BY MOUTH EVERY DAY WITH 250MG  TABLET FOR TOTAL OF 750MG     Dispense:  90 tablet    Refill:  3    Order Specific Question:   Supervising Provider    Answer:     . divalproex (DEPAKOTE ER) 250 MG 24 hr tablet    Sig: TAKE 1 TABLET BY MOUTH EVERY DAY WITH 500MG  TABLET FOR A TOTAL  OF 750MG     Dispense:  90 tablet    Refill:  3    Order Specific Question:   Supervising Provider    Answer:   Anson Fret [id number]      I spent 45 minutes with the patient. 50% of this time was spent counseling and educating patient on plan of care and medications.     , FNP-C 11/15/2019, 12:30 PM [facility] [address] [place], Shawnie Dapper 01/[birth year]2020 [phone]  Made any corrections needed, and agree with history, physical, neuro exam,assessment and plan as stated.     [place], MD [facility]

## 2019-11-22 ENCOUNTER — Other Ambulatory Visit: Payer: Self-pay

## 2019-11-22 ENCOUNTER — Ambulatory Visit (INDEPENDENT_AMBULATORY_CARE_PROVIDER_SITE_OTHER): Payer: Medicare Other | Admitting: *Deleted

## 2019-11-22 DIAGNOSIS — R002 Palpitations: Secondary | ICD-10-CM

## 2019-11-26 ENCOUNTER — Telehealth: Payer: Self-pay

## 2019-11-26 LAB — CUP PACEART INCLINIC DEVICE CHECK
Date Time Interrogation Session: 20210415101600
Implantable Pulse Generator Implant Date: 20180208

## 2019-11-26 NOTE — Progress Notes (Signed)
Loop check in clinic. Battery status: good. R-waves 0.34 mV. 0 symptom episodes, 0  tachy episodes, 0  pause episodes, 0 brady episodes.  113 AF episodes that are false  SR with PVCs, AF detection programmed to least sensative . Monthly summary reports and ROV with Dr Johney Frame prn.

## 2019-11-26 NOTE — Telephone Encounter (Signed)
Manual transmission received and reviewed.  Both episodes appear SR with ectopy.

## 2019-11-26 NOTE — Telephone Encounter (Signed)
I let the pt daughter that his monitor sent an alert for A-fib. I told her we need a manual transmission to see everything closely.   Per [facility] note: Linq event report. Two AF detections. Provided ECG is 6 minutes and appears SR with PACs. Will forward to triage for download of the missing episode. LC  The pt daughter states she will send the transmission later today.

## 2019-11-27 ENCOUNTER — Other Ambulatory Visit: Payer: Self-pay

## 2019-11-27 ENCOUNTER — Inpatient Hospital Stay (HOSPITAL_COMMUNITY)
Admission: EM | Admit: 2019-11-27 | Discharge: 2019-12-03 | DRG: 100 | Disposition: A | Discharge status: Skilled Nursing Facility | Payer: Medicare Other | Attending: Internal Medicine | Admitting: Internal Medicine

## 2019-11-27 ENCOUNTER — Emergency Department (HOSPITAL_COMMUNITY): Payer: Medicare Other

## 2019-11-27 ENCOUNTER — Encounter (HOSPITAL_COMMUNITY): Payer: Self-pay

## 2019-11-27 DIAGNOSIS — W19XXXA Unspecified fall, initial encounter: Secondary | ICD-10-CM | POA: Diagnosis present

## 2019-11-27 DIAGNOSIS — Z8249 Family history of ischemic heart disease and other diseases of the circulatory system: Secondary | ICD-10-CM | POA: Diagnosis not present

## 2019-11-27 DIAGNOSIS — R55 Syncope and collapse: Secondary | ICD-10-CM | POA: Diagnosis present

## 2019-11-27 DIAGNOSIS — E860 Dehydration: Secondary | ICD-10-CM | POA: Diagnosis present

## 2019-11-27 DIAGNOSIS — E785 Hyperlipidemia, unspecified: Secondary | ICD-10-CM | POA: Diagnosis not present

## 2019-11-27 DIAGNOSIS — G9341 Metabolic encephalopathy: Secondary | ICD-10-CM | POA: Diagnosis present

## 2019-11-27 DIAGNOSIS — R569 Unspecified convulsions: Secondary | ICD-10-CM | POA: Diagnosis not present

## 2019-11-27 DIAGNOSIS — I340 Nonrheumatic mitral (valve) insufficiency: Secondary | ICD-10-CM | POA: Diagnosis not present

## 2019-11-27 DIAGNOSIS — R748 Abnormal levels of other serum enzymes: Secondary | ICD-10-CM | POA: Diagnosis not present

## 2019-11-27 DIAGNOSIS — Z823 Family history of stroke: Secondary | ICD-10-CM

## 2019-11-27 DIAGNOSIS — Z807 Family history of other malignant neoplasms of lymphoid, hematopoietic and related tissues: Secondary | ICD-10-CM

## 2019-11-27 DIAGNOSIS — Z7902 Long term (current) use of antithrombotics/antiplatelets: Secondary | ICD-10-CM

## 2019-11-27 DIAGNOSIS — R2681 Unsteadiness on feet: Secondary | ICD-10-CM | POA: Diagnosis not present

## 2019-11-27 DIAGNOSIS — R079 Chest pain, unspecified: Secondary | ICD-10-CM | POA: Diagnosis not present

## 2019-11-27 DIAGNOSIS — I10 Essential (primary) hypertension: Secondary | ICD-10-CM | POA: Diagnosis not present

## 2019-11-27 DIAGNOSIS — M2550 Pain in unspecified joint: Secondary | ICD-10-CM | POA: Diagnosis not present

## 2019-11-27 DIAGNOSIS — R41841 Cognitive communication deficit: Secondary | ICD-10-CM | POA: Diagnosis not present

## 2019-11-27 DIAGNOSIS — M6282 Rhabdomyolysis: Secondary | ICD-10-CM | POA: Diagnosis present

## 2019-11-27 DIAGNOSIS — G40209 Localization-related (focal) (partial) symptomatic epilepsy and epileptic syndromes with complex partial seizures, not intractable, without status epilepticus: Secondary | ICD-10-CM | POA: Diagnosis not present

## 2019-11-27 DIAGNOSIS — I4891 Unspecified atrial fibrillation: Secondary | ICD-10-CM | POA: Diagnosis present

## 2019-11-27 DIAGNOSIS — K219 Gastro-esophageal reflux disease without esophagitis: Secondary | ICD-10-CM | POA: Diagnosis not present

## 2019-11-27 DIAGNOSIS — Z791 Long term (current) use of non-steroidal anti-inflammatories (NSAID): Secondary | ICD-10-CM | POA: Diagnosis not present

## 2019-11-27 DIAGNOSIS — M109 Gout, unspecified: Secondary | ICD-10-CM | POA: Diagnosis present

## 2019-11-27 DIAGNOSIS — I69828 Other speech and language deficits following other cerebrovascular disease: Secondary | ICD-10-CM | POA: Diagnosis not present

## 2019-11-27 DIAGNOSIS — Z8673 Personal history of transient ischemic attack (TIA), and cerebral infarction without residual deficits: Secondary | ICD-10-CM

## 2019-11-27 DIAGNOSIS — Z79899 Other long term (current) drug therapy: Secondary | ICD-10-CM | POA: Diagnosis not present

## 2019-11-27 DIAGNOSIS — Z66 Do not resuscitate: Secondary | ICD-10-CM | POA: Diagnosis present

## 2019-11-27 DIAGNOSIS — D638 Anemia in other chronic diseases classified elsewhere: Secondary | ICD-10-CM | POA: Diagnosis present

## 2019-11-27 DIAGNOSIS — I69351 Hemiplegia and hemiparesis following cerebral infarction affecting right dominant side: Secondary | ICD-10-CM | POA: Diagnosis not present

## 2019-11-27 DIAGNOSIS — Z7982 Long term (current) use of aspirin: Secondary | ICD-10-CM

## 2019-11-27 DIAGNOSIS — R778 Other specified abnormalities of plasma proteins: Secondary | ICD-10-CM | POA: Diagnosis present

## 2019-11-27 DIAGNOSIS — I491 Atrial premature depolarization: Secondary | ICD-10-CM | POA: Diagnosis not present

## 2019-11-27 DIAGNOSIS — I359 Nonrheumatic aortic valve disorder, unspecified: Secondary | ICD-10-CM | POA: Diagnosis not present

## 2019-11-27 DIAGNOSIS — Z03818 Encounter for observation for suspected exposure to other biological agents ruled out: Secondary | ICD-10-CM | POA: Diagnosis not present

## 2019-11-27 DIAGNOSIS — Z87898 Personal history of other specified conditions: Secondary | ICD-10-CM

## 2019-11-27 DIAGNOSIS — M1990 Unspecified osteoarthritis, unspecified site: Secondary | ICD-10-CM | POA: Diagnosis not present

## 2019-11-27 DIAGNOSIS — Z820 Family history of epilepsy and other diseases of the nervous system: Secondary | ICD-10-CM | POA: Diagnosis not present

## 2019-11-27 DIAGNOSIS — Z20822 Contact with and (suspected) exposure to covid-19: Secondary | ICD-10-CM | POA: Diagnosis not present

## 2019-11-27 DIAGNOSIS — I69328 Other speech and language deficits following cerebral infarction: Secondary | ICD-10-CM | POA: Diagnosis not present

## 2019-11-27 DIAGNOSIS — F0390 Unspecified dementia without behavioral disturbance: Secondary | ICD-10-CM | POA: Diagnosis not present

## 2019-11-27 DIAGNOSIS — D649 Anemia, unspecified: Secondary | ICD-10-CM | POA: Diagnosis present

## 2019-11-27 DIAGNOSIS — Z7401 Bed confinement status: Secondary | ICD-10-CM | POA: Diagnosis not present

## 2019-11-27 DIAGNOSIS — S0990XA Unspecified injury of head, initial encounter: Secondary | ICD-10-CM | POA: Diagnosis not present

## 2019-11-27 DIAGNOSIS — M6281 Muscle weakness (generalized): Secondary | ICD-10-CM | POA: Diagnosis not present

## 2019-11-27 DIAGNOSIS — G40219 Localization-related (focal) (partial) symptomatic epilepsy and epileptic syndromes with complex partial seizures, intractable, without status epilepticus: Secondary | ICD-10-CM | POA: Diagnosis not present

## 2019-11-27 DIAGNOSIS — I350 Nonrheumatic aortic (valve) stenosis: Secondary | ICD-10-CM | POA: Diagnosis not present

## 2019-11-27 LAB — URINALYSIS, ROUTINE W REFLEX MICROSCOPIC
Bacteria, UA: NONE SEEN
Bilirubin Urine: NEGATIVE
Glucose, UA: NEGATIVE mg/dL
Ketones, ur: 20 mg/dL — AB
Leukocytes,Ua: NEGATIVE
Nitrite: NEGATIVE
Protein, ur: 30 mg/dL — AB
Specific Gravity, Urine: 1.016 (ref 1.005–1.030)
pH: 6 (ref 5.0–8.0)

## 2019-11-27 LAB — COMPREHENSIVE METABOLIC PANEL
ALT: 18 U/L (ref 0–44)
AST: 48 U/L — ABNORMAL HIGH (ref 15–41)
Albumin: 3.8 g/dL (ref 3.5–5.0)
Alkaline Phosphatase: 84 U/L (ref 38–126)
Anion gap: 12 (ref 5–15)
BUN: 15 mg/dL (ref 8–23)
CO2: 27 mmol/L (ref 22–32)
Calcium: 9.2 mg/dL (ref 8.9–10.3)
Chloride: 100 mmol/L (ref 98–111)
Creatinine, Ser: 1.05 mg/dL (ref 0.61–1.24)
GFR calc Af Amer: >60 mL/min (ref >60)
GFR calc non Af Amer: >60 mL/min (ref >60)
Glucose, Bld: 111 mg/dL — ABNORMAL HIGH (ref 70–99)
Potassium: 4.1 mmol/L (ref 3.5–5.1)
Sodium: 139 mmol/L (ref 135–145)
Total Bilirubin: 0.8 mg/dL (ref 0.3–1.2)
Total Protein: 6.8 g/dL (ref 6.5–8.1)

## 2019-11-27 LAB — RAPID URINE DRUG SCREEN, HOSP PERFORMED
Amphetamines: NOT DETECTED
Barbiturates: NOT DETECTED
Benzodiazepines: NOT DETECTED
Cocaine: NOT DETECTED
Opiates: NOT DETECTED
Tetrahydrocannabinol: NOT DETECTED

## 2019-11-27 LAB — AMMONIA: Ammonia: <9 umol/L — ABNORMAL LOW (ref 9–35)

## 2019-11-27 LAB — I-STAT CHEM 8, ED
BUN: 19 mg/dL (ref 8–23)
Calcium, Ion: 1.12 mmol/L — ABNORMAL LOW (ref 1.15–1.40)
Chloride: 100 mmol/L (ref 98–111)
Creatinine, Ser: 0.9 mg/dL (ref 0.61–1.24)
Glucose, Bld: 105 mg/dL — ABNORMAL HIGH (ref 70–99)
HCT: 34 % — ABNORMAL LOW (ref 39.0–52.0)
Hemoglobin: 11.6 g/dL — ABNORMAL LOW (ref 13.0–17.0)
Potassium: 3.8 mmol/L (ref 3.5–5.1)
Sodium: 137 mmol/L (ref 135–145)
TCO2: 29 mmol/L (ref 22–32)

## 2019-11-27 LAB — ETHANOL: Alcohol, Ethyl (B): <10 mg/dL (ref <10)

## 2019-11-27 LAB — CBC
HCT: 33.9 % — ABNORMAL LOW (ref 39.0–52.0)
HCT: 32.1 % — ABNORMAL LOW (ref 39.0–52.0)
Hemoglobin: 11.1 g/dL — ABNORMAL LOW (ref 13.0–17.0)
Hemoglobin: 10.4 g/dL — ABNORMAL LOW (ref 13.0–17.0)
MCH: 31.3 pg (ref 26.0–34.0)
MCH: 31.3 pg (ref 26.0–34.0)
MCHC: 32.7 g/dL (ref 30.0–36.0)
MCHC: 32.4 g/dL (ref 30.0–36.0)
MCV: 95.5 fL (ref 80.0–100.0)
MCV: 96.7 fL (ref 80.0–100.0)
Platelets: 177 10*3/uL (ref 150–400)
Platelets: 163 10*3/uL (ref 150–400)
RBC: 3.55 MIL/uL — ABNORMAL LOW (ref 4.22–5.81)
RBC: 3.32 MIL/uL — ABNORMAL LOW (ref 4.22–5.81)
RDW: 13 % (ref 11.5–15.5)
RDW: 13 % (ref 11.5–15.5)
WBC: 9.7 10*3/uL (ref 4.0–10.5)
WBC: 9.2 10*3/uL (ref 4.0–10.5)
nRBC: 0 % (ref 0.0–0.2)
nRBC: 0 % (ref 0.0–0.2)

## 2019-11-27 LAB — TROPONIN I (HIGH SENSITIVITY)
Troponin I (High Sensitivity): 42 ng/L — ABNORMAL HIGH (ref <18)
Troponin I (High Sensitivity): 43 ng/L — ABNORMAL HIGH (ref <18)

## 2019-11-27 LAB — CK: Total CK: 1747 U/L — ABNORMAL HIGH (ref 49–397)

## 2019-11-27 LAB — PHOSPHORUS: Phosphorus: 3.4 mg/dL (ref 2.5–4.6)

## 2019-11-27 LAB — MAGNESIUM: Magnesium: 1.8 mg/dL (ref 1.7–2.4)

## 2019-11-27 LAB — PROTIME-INR
INR: 1.1 (ref 0.8–1.2)
Prothrombin Time: 13.9 seconds (ref 11.4–15.2)

## 2019-11-27 LAB — SAMPLE TO BLOOD BANK

## 2019-11-27 LAB — CREATININE, SERUM
Creatinine, Ser: 1.01 mg/dL (ref 0.61–1.24)
GFR calc Af Amer: >60 mL/min (ref >60)
GFR calc non Af Amer: >60 mL/min (ref >60)

## 2019-11-27 LAB — TSH: TSH: 4.094 u[IU]/mL (ref 0.350–4.500)

## 2019-11-27 LAB — VITAMIN B12: Vitamin B-12: 2819 pg/mL — ABNORMAL HIGH (ref 180–914)

## 2019-11-27 LAB — VALPROIC ACID LEVEL: Valproic Acid Lvl: 22 ug/mL — ABNORMAL LOW (ref 50.0–100.0)

## 2019-11-27 LAB — SARS CORONAVIRUS 2 (TAT 6-24 HRS): SARS Coronavirus 2: NEGATIVE

## 2019-11-27 LAB — LACTIC ACID, PLASMA: Lactic Acid, Venous: 1.1 mmol/L (ref 0.5–1.9)

## 2019-11-27 MED ORDER — SODIUM CHLORIDE 0.9 % IV SOLN: INTRAVENOUS | Status: DC

## 2019-11-27 MED ORDER — ONDANSETRON HCL 4 MG/2ML IJ SOLN: 4.0000 mg | Freq: Four times a day (QID) | INTRAMUSCULAR | Status: DC | PRN

## 2019-11-27 MED ORDER — ENOXAPARIN SODIUM 40 MG/0.4ML ~~LOC~~ SOLN
40.0000 mg | SUBCUTANEOUS | Status: DC
  Administered 2019-11-27 – 2019-12-03 (×7): 40 mg via SUBCUTANEOUS
  Filled 2019-11-27 (×7): qty 0.4

## 2019-11-27 MED ORDER — VITAMIN D 25 MCG (1000 UNIT) PO TABS
1000.0000 [IU] | ORAL_TABLET | Freq: Every day | ORAL | Status: DC
  Administered 2019-11-27 – 2019-12-03 (×7): 1000 [IU] via ORAL
  Filled 2019-11-27 (×12): qty 1

## 2019-11-27 MED ORDER — ACETAMINOPHEN 650 MG RE SUPP: 650.0000 mg | Freq: Four times a day (QID) | RECTAL | Status: DC | PRN

## 2019-11-27 MED ORDER — SODIUM CHLORIDE 0.9 % IV BOLUS
1000.0000 mL | Freq: Once | INTRAVENOUS | Status: AC
  Administered 2019-11-27: 1000 mL via INTRAVENOUS

## 2019-11-27 MED ORDER — SODIUM CHLORIDE 0.9% FLUSH
3.0000 mL | Freq: Two times a day (BID) | INTRAVENOUS | Status: DC
  Administered 2019-11-27 – 2019-12-03 (×9): 3 mL via INTRAVENOUS

## 2019-11-27 MED ORDER — EZETIMIBE 10 MG PO TABS
10.0000 mg | ORAL_TABLET | Freq: Every day | ORAL | Status: DC
  Administered 2019-11-27 – 2019-12-03 (×7): 10 mg via ORAL
  Filled 2019-11-27 (×7): qty 1

## 2019-11-27 MED ORDER — CLOPIDOGREL BISULFATE 75 MG PO TABS
75.0000 mg | ORAL_TABLET | Freq: Every day | ORAL | Status: DC
  Administered 2019-11-27 – 2019-12-03 (×7): 75 mg via ORAL
  Filled 2019-11-27 (×7): qty 1

## 2019-11-27 MED ORDER — DIVALPROEX SODIUM 250 MG PO DR TAB
750.0000 mg | DELAYED_RELEASE_TABLET | Freq: Once | ORAL | Status: DC
  Filled 2019-11-27: qty 3

## 2019-11-27 MED ORDER — FERROUS SULFATE 325 (65 FE) MG PO TABS
325.0000 mg | ORAL_TABLET | Freq: Every day | ORAL | Status: DC
  Administered 2019-11-28 – 2019-12-03 (×6): 325 mg via ORAL
  Filled 2019-11-27 (×6): qty 1

## 2019-11-27 MED ORDER — ASPIRIN EC 325 MG PO TBEC
325.0000 mg | DELAYED_RELEASE_TABLET | Freq: Every day | ORAL | Status: DC
  Administered 2019-11-27 – 2019-12-03 (×7): 325 mg via ORAL
  Filled 2019-11-27 (×7): qty 1

## 2019-11-27 MED ORDER — PROSIGHT PO TABS
1.0000 | ORAL_TABLET | Freq: Every day | ORAL | Status: DC
  Administered 2019-11-27 – 2019-12-03 (×7): 1 via ORAL
  Filled 2019-11-27 (×7): qty 1

## 2019-11-27 MED ORDER — FAMOTIDINE 20 MG PO TABS
20.0000 mg | ORAL_TABLET | Freq: Every day | ORAL | Status: DC
  Administered 2019-11-27 – 2019-12-03 (×7): 20 mg via ORAL
  Filled 2019-11-27 (×7): qty 1

## 2019-11-27 MED ORDER — ACETAMINOPHEN 325 MG PO TABS
650.0000 mg | ORAL_TABLET | Freq: Four times a day (QID) | ORAL | Status: DC | PRN
  Filled 2019-11-27: qty 2

## 2019-11-27 MED ORDER — DIVALPROEX SODIUM ER 500 MG PO TB24
750.0000 mg | ORAL_TABLET | Freq: Every day | ORAL | Status: DC
  Administered 2019-11-28 – 2019-12-03 (×6): 750 mg via ORAL
  Filled 2019-11-27 (×7): qty 1

## 2019-11-27 MED ORDER — DIVALPROEX SODIUM ER 250 MG PO TB24
750.0000 mg | ORAL_TABLET | Freq: Every day | ORAL | Status: DC
  Administered 2019-11-27: 750 mg via ORAL
  Filled 2019-11-27: qty 3

## 2019-11-27 MED ORDER — VITAMIN B-12 1000 MCG PO TABS
1000.0000 ug | ORAL_TABLET | Freq: Every day | ORAL | Status: DC
  Administered 2019-11-28 – 2019-12-03 (×6): 1000 ug via ORAL
  Filled 2019-11-27 (×6): qty 1

## 2019-11-27 MED ORDER — DIVALPROEX SODIUM ER 500 MG PO TB24: 500.0000 mg | ORAL_TABLET | ORAL | Status: DC

## 2019-11-27 MED ORDER — ONDANSETRON HCL 4 MG PO TABS: 4.0000 mg | ORAL_TABLET | Freq: Four times a day (QID) | ORAL | Status: DC | PRN

## 2019-11-27 NOTE — ED Triage Notes (Signed)
Pt bib [facility] w/ c/o fall that possibly took place yesterday. Pt was last heard from at 1700 yesterday by family and then family checked on him this morning b/c they did not hear from him last night and found him on the floor. It was noted that pt did not take his 2200 meds, and family states he always takes his medications. Pt does not know when, if, or how he ended up on the floor. No trauma or deformities noted w/ EMS. Neg stroke screen w/ EMS. Pt AOx4. EMS VSS. CBG 124.

## 2019-11-27 NOTE — ED Provider Notes (Signed)
[facility] EMERGENCY DEPARTMENT Provider Note   CSN: [id number] Arrival date & time: 11/27/19  1129     History Chief Complaint  Patient presents with  . Fall    Nicholas Gaines is a 86 y.o. male with PMHx A fib, stroke currently on Plavix, GERD, Seizure disorder who presents to the ED today s/p fall that occurred sometime between 5 pm last night and 10 AM this morning. Per EMS pt's family last saw him at 5 PM last night. They went to check on him again this morning at 10 AM and found him on the kitchen floor; unknown downtime. Pt cannot remember falling. He is unsure how he fell or if he even fell. He has no complaints currently.   The history is provided by the patient and medical records.   Daughter at bedside after initial evaluation.  Ports that he has been acting like his normal self while she was with him yesterday.  Patient has not been complaining of anything recently including fevers, chills, cough, shortness of breath, chest pain, any other symptoms. Does mention that he did not take his medication last night including his seizure medicine.  She states that patient did have a recent seizure after he got his second Covid vaccine March 1.  He is currently on Depakote.  States that typically he would know the year but may be thinking about his wife who passed 11/22/1999.     Past Medical History:  Diagnosis Date  . A-fib (HCC)   . Arthritis   . GERD (gastroesophageal reflux disease)   . Heart murmur   . HLD (hyperlipidemia)   . Seizures (HCC)    last sz 04/17/17  . Stroke [facility])    tia    Patient Active Problem List   Diagnosis Date Noted  . Syncope   . Elevated CK   . Generalized weakness 08/09/2019  . Seizures (HCC) 06/04/2019  . Complex partial seizure with impairment of consciousness at onset [facility]) 05/29/2018  . Mild cognitive impairment 05/29/2018  . Palpitations 09/16/2016  . Normocytic anemia 09/09/2016  . Aortic valve calcification 09/09/2016    . Aortic valve sclerosis 09/09/2016  . BPH (benign prostatic hyperplasia) 09/09/2016  . Cardiac murmur 09/09/2016  . Essential (primary) hypertension 09/09/2016  . Facial weakness status post cerebrovascular accident 09/09/2016  . H/O: stroke with residual effects 09/09/2016  . History of stroke 09/09/2016  . Prediabetes 09/09/2016  . Vitamin D deficiency 09/09/2016  . Alteration of consciousness 08/14/2016  . Acute encephalopathy 08/02/2016  . Difficulty speaking 08/02/2016  . Bronchitis 08/02/2016  . HLD (hyperlipidemia) 08/02/2016  . Stroke (HCC)   . GERD (gastroesophageal reflux disease)   . Gastroesophageal reflux disease without esophagitis   . Transient cerebral ischemia     Past Surgical History:  Procedure Laterality Date  . CARPAL TUNNEL RELEASE     About 10 yr ago (08/13/16)  . LOOP RECORDER INSERTION N/A 09/16/2016   Procedure: Loop Recorder Insertion;  Surgeon: Hillis Range, MD;  Location: [facility] INVASIVE CV LAB;  Service: Cardiovascular;  Laterality: N/A;  . TONSILLECTOMY         Family History  Problem Relation Age of Onset  . Hypertension Mother   . Parkinson's disease Mother   . Heart attack Mother   . Stroke Sister   . Lymphoma Sister   . Stroke Brother   . CVA Father   . Stroke Paternal Grandmother        in her early 64's  .  Stroke Paternal Grandfather        in his early 86's  . Pulmonary embolism Sister        04/2018  . Diabetes Neg Hx     Social History   Tobacco Use  . Smoking status: Never Smoker  . Smokeless tobacco: Never Used  Substance Use Topics  . Alcohol use: No    Alcohol/week: 0.0 standard drinks  . Drug use: No    Home Medications Prior to Admission medications   Medication Sig Start Date End Date Taking? Authorizing Provider  acetaminophen (TYLENOL) 500 MG tablet Take 650 mg by mouth 2 (two) times daily.    Yes [provider]  aspirin EC 325 MG tablet Take 325 mg by mouth daily.   Yes [provider]   Cholecalciferol 1000 units capsule Take 1,000 Units by mouth daily.    Yes [provider]  clopidogrel (PLAVIX) 75 MG tablet Take 75 mg by mouth daily.  05/22/15  Yes [provider]  divalproex (DEPAKOTE ER) 250 MG 24 hr tablet TAKE 1 TABLET BY MOUTH EVERY DAY WITH 500MG  TABLET FOR A TOTAL OF 750MG  Patient taking differently: Take 250 mg by mouth See admin instructions. TAKE 1 TABLET BY MOUTH EVERY DAY WITH 500MG  TABLET FOR A TOTAL OF 750MG  11/15/19  Yes Lomax, Amy, NP  divalproex (DEPAKOTE ER) 500 MG 24 hr tablet TAKE 1 TABLET BY MOUTH EVERY DAY WITH 250MG  TABLET FOR TOTAL OF 750MG  Patient taking differently: Take 500 mg by mouth See admin instructions. TAKE 1 TABLET BY MOUTH EVERY DAY WITH 250MG  TABLET FOR TOTAL OF 750MG  11/15/19  Yes Lomax, Amy, NP  famotidine (PEPCID) 20 MG tablet Take 20 mg by mouth daily.  05/28/19  Yes [provider]  ferrous sulfate 325 (65 FE) MG EC tablet Take 325 mg by mouth daily with breakfast.   Yes [provider]  ibuprofen (ADVIL,MOTRIN) 200 MG tablet Take 600 mg by mouth daily as needed for headache or moderate pain.    Yes [provider]  LYCOPENE PO Take 10 mg by mouth daily.    Yes [provider]  Multiple Vitamins-Minerals (ICAPS) CAPS Take 1 capsule by mouth daily.   Yes [provider]  Omega-3 Fatty Acids (FISH OIL) 1000 MG CAPS Take 2,000 mg by mouth daily.   Yes [provider]  Saw Palmetto 450 MG CAPS Take 900 mg by mouth daily.    Yes [provider]  vitamin B-12 (CYANOCOBALAMIN) 1000 MCG tablet Take 1,000 mcg by mouth daily.   Yes [provider]  ZETIA 10 MG tablet Take 10 mg by mouth daily.  05/22/15  Yes [provider]    Allergies    Rocephin [ceftriaxone sodium in dextrose], Atorvastatin, Pravastatin, and Rosuvastatin calcium  Review of Systems   Review of Systems  Constitutional: Negative for chills and fever.  Respiratory: Negative for  shortness of breath.   Cardiovascular: Negative for chest pain.  Gastrointestinal: Negative for nausea and vomiting.  Musculoskeletal: Negative for arthralgias, back pain and neck pain.  Neurological: Negative for headaches.  All other systems reviewed and are negative.   Physical Exam Updated Vital Signs BP (!) 146/86   Pulse 74   Resp 12   SpO2 100%   Physical Exam Vitals and nursing note reviewed.  Constitutional:      Appearance: He is not ill-appearing or diaphoretic.  HENT:     Head: Normocephalic and atraumatic.  Eyes:  Conjunctiva/sclera: Conjunctivae normal.  Cardiovascular:     Rate and Rhythm: Normal rate and regular rhythm.     Pulses: Normal pulses.  Pulmonary:     Effort: Pulmonary effort is normal.     Breath sounds: Normal breath sounds. No wheezing, rhonchi or rales.  Abdominal:     Palpations: Abdomen is soft.     Tenderness: There is no abdominal tenderness. There is no guarding or rebound.  Musculoskeletal:     Cervical back: Neck supple.  Skin:    General: Skin is warm and dry.  Neurological:     General: No focal deficit present.     Mental Status: He is alert. Mental status is at baseline.     Cranial Nerves: No cranial nerve deficit.     Motor: No weakness.     Comments: Alert to self and place. Believes it is 2001; when asked whom the president is pt reports "I'd rather not think of him right now."  CN 2-12 grossly intact GCS 15 Sensation and strength intact to BUE and BLEs Coordination with finger-to-nose WNL Neg pronator drift      ED Results / Procedures / Treatments   Labs (all labs ordered are listed, but only abnormal results are displayed) Labs Reviewed  COMPREHENSIVE METABOLIC PANEL - Abnormal; Notable for the following components:      Result Value   Glucose, Bld 111 (*)    AST 48 (*)    All other components within normal limits  CBC - Abnormal; Notable for the following components:   RBC 3.55 (*)    Hemoglobin 11.1  (*)    HCT 33.9 (*)    All other components within normal limits  CK - Abnormal; Notable for the following components:   Total CK 1,747 (*)    All other components within normal limits  CBC - Abnormal; Notable for the following components:   RBC 3.32 (*)    Hemoglobin 10.4 (*)    HCT 32.1 (*)    All other components within normal limits  I-STAT CHEM 8, ED - Abnormal; Notable for the following components:   Glucose, Bld 105 (*)    Calcium, Ion 1.12 (*)    Hemoglobin 11.6 (*)    HCT 34.0 (*)    All other components within normal limits  TROPONIN I (HIGH SENSITIVITY) - Abnormal; Notable for the following components:   Troponin I (High Sensitivity) 42 (*)    All other components within normal limits  SARS CORONAVIRUS 2 (TAT 6-24 HRS)  ETHANOL  PROTIME-INR  URINALYSIS, ROUTINE W REFLEX MICROSCOPIC  LACTIC ACID, PLASMA  LACTIC ACID, PLASMA  TSH  AMMONIA  RAPID URINE DRUG SCREEN, HOSP PERFORMED  VALPROIC ACID LEVEL  CREATININE, SERUM  MAGNESIUM  PHOSPHORUS  VITAMIN B12  FOLATE RBC  SAMPLE TO BLOOD BANK  TROPONIN I (HIGH SENSITIVITY)    EKG None  Radiology CT Head Wo Contrast  Result Date: 11/27/2019 CLINICAL DATA:  Unwitnessed fall EXAM: CT HEAD WITHOUT CONTRAST TECHNIQUE: Contiguous axial images were obtained from the base of the skull through the vertex without intravenous contrast. COMPARISON:  08/08/2019 FINDINGS: Brain: No evidence of acute infarction, hemorrhage, hydrocephalus, extra-axial collection or mass lesion/mass effect. Extensive low-density changes within the periventricular and subcortical white matter compatible with chronic microvascular ischemic change. Moderate diffuse cerebral volume loss. Vascular: Atherosclerotic calcifications involving the large vessels of the skull base. No unexpected hyperdense vessel. Skull: Normal. Negative for fracture or focal lesion. Sinuses/Orbits: No acute finding. Other: None.  IMPRESSION: 1.  No acute intracranial findings. 2.   Chronic microvascular ischemic change and cerebral volume loss. Electronically Signed   By: Duanne GuessNicholas  Plundo D.O.   On: 11/27/2019 13:06   DG Chest Portable 1 View  Result Date: 11/27/2019 CLINICAL DATA:  Larey SeatFell yesterday. Denies chest pain or shortness of breath. EXAM: PORTABLE CHEST 1 VIEW COMPARISON:  Chest x-ray dated August 08, 2019. FINDINGS: The heart size and mediastinal contours are within normal limits. Loop recorder again noted. Normal pulmonary vascularity. Minimal bibasilar atelectasis with unchanged scarring at the left lung base. No focal consolidation, pleural effusion, or pneumothorax. No acute osseous abnormality. Unchanged large hiatal hernia. IMPRESSION: No active disease. Electronically Signed   By: Obie DredgeWilliam T Derry M.D.   On: 11/27/2019 12:19    Procedures Procedures (including critical care time)  Medications Ordered in ED Medications  divalproex (DEPAKOTE) DR tablet 750 mg (has no administration in time range)  sodium chloride flush (NS) 0.9 % injection 3 mL (has no administration in time range)  enoxaparin (LOVENOX) injection 40 mg (has no administration in time range)  0.9 %  sodium chloride infusion (has no administration in time range)  acetaminophen (TYLENOL) tablet 650 mg (has no administration in time range)    Or  acetaminophen (TYLENOL) suppository 650 mg (has no administration in time range)  ondansetron (ZOFRAN) tablet 4 mg (has no administration in time range)    Or  ondansetron (ZOFRAN) injection 4 mg (has no administration in time range)  aspirin EC tablet 325 mg (has no administration in time range)  ezetimibe (ZETIA) tablet 10 mg (has no administration in time range)  famotidine (PEPCID) tablet 20 mg (has no administration in time range)  clopidogrel (PLAVIX) tablet 75 mg (has no administration in time range)  ferrous sulfate tablet 325 mg (has no administration in time range)  vitamin B-12 (CYANOCOBALAMIN) tablet 1,000 mcg (has no administration in  time range)  divalproex (DEPAKOTE ER) 24 hr tablet 750 mg (has no administration in time range)  cholecalciferol (VITAMIN D) tablet 1,000 Units (has no administration in time range)  multivitamin (PROSIGHT) tablet 1 tablet (has no administration in time range)  sodium chloride 0.9 % bolus 1,000 mL (0 mLs Intravenous Stopped 11/27/19 1508)    ED Course  I have reviewed the triage vital signs and the nursing notes.  Pertinent labs & imaging results that were available during my care of the patient were reviewed by me and considered in my medical decision making (see chart for details).  Clinical Course as of Nov 26 1524  Tue Nov 27, 2019  1307 CK Total(!): 1,747 [MV]  1307 Troponin I (High Sensitivity)(!): 42 [MV]    Clinical Course User Index [MV] Tanda RockersVenter, Marquies Wanat, PA-C   MDM Rules/Calculators/A&P                      86 year old male who presents to the ED today status post fall that occurred sometime between 5 PM last night and 10 AM this morning.  Unknown downtime. Family found patient on the kitchen floor today.  He is currently on Plavix for previous stroke.  He has no complaints at this time.  He has no focal neuro deficits on exam.  He is alert to person and place however he thinks it is 2001 which his daughter states is atypical for him.  On arrival to the ED patient is afebrile, nontachycardic and nontachypneic.  He appears to be in no acute distress.  He has  no tenderness to extremities, back, neck, abdomen, chest.  Do not feel he needs any additional imaging besides CT head, as well as chest x-ray which was obtained to rule out infection as we do not know why patient fell.  Will add screening labs including CBC, CMP, CK, troponin, UA.  Does have loop recorder, will interrogate. Discussed case with attending physician Dr. Sedonia Small who agrees with plan.   CBC without leukocytosis. Hgb stable.  CMP without electrolyte abnormalities. Creatinine stable at 1.05 CK elevated at 1,747. Fluids  initiated.  Troponin elevated at 47 however suspect this is likely related to pt's rhabdo  CT Head negative CXR negative   Given rhabdomyolysis feel pt requires admission at this time. He continues to have no acute complaints and appears well. Daughter is concerned pt could have had seizure that caused his fall but given it was unwitnessed with prolonged down time difficult to assess. He did miss his night time dose of depakote yesterday; will order this in the ED now. Will consult hospitalist for admission.   Dr. Doristine Bosworth with [facility] to admit.   This note was prepared using Dragon voice recognition software and may include unintentional dictation errors due to the inherent limitations of voice recognition software.  Final Clinical Impression(s) / ED Diagnoses Final diagnoses:  Elevated CK  Fall, initial encounter  History of seizure    Rx / DC Orders ED Discharge Orders    None       Eustaquio Maize, PA-C 11/27/19 1526    Maudie Flakes, MD 11/28/19 2242

## 2019-11-27 NOTE — H&P (Signed)
History and Physical    [name] BMW:[id number] DOB: Jan 31, 1929 DOA: 11/27/2019  PCP: [name], MD  Patient coming from: Home  I have personally briefly reviewed patient's old medical records in [facility]  Chief Complaint: Fall  HPI: Nicholas Gaines is a 86 y.o. male with medical history significant of stroke with minimal residual weakness-has loop recorder, aortic valve disease, hyperlipidemia, GERD, gout, complex partial seizure disorder, presents to ER due to fall that occurred probably between 5 PM last night and 10 AM this morning.  Daughter is the historian who reports that patient was doing fine at 5 PM yesterday.  She went to check on him again this morning at 10 AM and found him on the kitchen floor, unknown downtime.  Patient does not remember falling, he is unsure how he fell.  He denies headache, blurry vision, slurred speech, head trauma, lightheadedness, dizziness, facial droop, numbness weakness tingling sensation in hands or feet, chest pain, shortness of breath, palpitation, nausea, vomiting, diarrhea, fever, chills, cough, congestion, urinary or bowel changes.  Daughter thinks that patient fall likely due to seizure.  He takes Depakote 750 mg at home and he missed last night dose.  This morning she noted that patient was confused and does not remember anything about the fall.  He has loop recorder which was placed about 3 years ago for the concern of palpitation and symptoms concerning for TIA.  His last interrogation was done on last Thursday which was negative.  He lives alone at home.  Uses walker and cane for ambulation.  No history of smoking, alcohol, illicit drug use.  ED Course: Upon arrival to ED: Patient's vital signs stable.  Afebrile with no leukocytosis.  Troponin: 42, CK: 1747, ethanol, INR: WNL, chest x-ray and CT head without contrast came back negative for acute findings.  UA and COVID-19 pending.  Patient received IV fluid bolus in ED.  [facility]  hospitalist consulted for admission for syncope.  Review of Systems: As per HPI otherwise negative.    Past Medical History:  Diagnosis Date  . A-fib (HCC)   . Arthritis   . GERD (gastroesophageal reflux disease)   . Heart murmur   . HLD (hyperlipidemia)   . Seizures (HCC)    last sz 04/17/17  . Stroke [facility])    tia    Past Surgical History:  Procedure Laterality Date  . CARPAL TUNNEL RELEASE     About 10 yr ago (08/13/16)  . LOOP RECORDER INSERTION N/A 09/16/2016   Procedure: Loop Recorder Insertion;  Surgeon: Hillis Range, MD;  Location: [facility] INVASIVE CV LAB;  Service: Cardiovascular;  Laterality: N/A;  . TONSILLECTOMY       reports that he has never smoked. He has never used smokeless tobacco. He reports that he does not drink alcohol or use drugs.  Allergies  Allergen Reactions  . Rocephin [Ceftriaxone Sodium In Dextrose] Other (See Comments)    C-Diff  . Atorvastatin     Other reaction(s): myalgia  . Pravastatin     Other reaction(s): myalgia  . Rosuvastatin Calcium     Other reaction(s): fatigued    Family History  Problem Relation Age of Onset  . Hypertension Mother   . Parkinson's disease Mother   . Heart attack Mother   . Stroke Sister   . Lymphoma Sister   . Stroke Brother   . CVA Father   . Stroke Paternal Grandmother        in her early 71's  .  Stroke Paternal Grandfather        in his early 33's  . Pulmonary embolism Sister        04/2018  . Diabetes Neg Hx     Prior to Admission medications   Medication Sig Start Date End Date Taking? Authorizing Provider  acetaminophen (TYLENOL) 500 MG tablet Take 650 mg by mouth 2 (two) times daily.    Yes [provider]  aspirin EC 325 MG tablet Take 325 mg by mouth daily.   Yes [provider]  Cholecalciferol 1000 units capsule Take 1,000 Units by mouth daily.    Yes [provider]  clopidogrel (PLAVIX) 75 MG tablet Take 75 mg by mouth daily.  05/22/15  Yes [provider]    divalproex (DEPAKOTE ER) 250 MG 24 hr tablet TAKE 1 TABLET BY MOUTH EVERY DAY WITH 500MG  TABLET FOR A TOTAL OF 750MG  Patient taking differently: Take 250 mg by mouth See admin instructions. TAKE 1 TABLET BY MOUTH EVERY DAY WITH 500MG  TABLET FOR A TOTAL OF 750MG  11/15/19  Yes Lomax, Amy, NP  divalproex (DEPAKOTE ER) 500 MG 24 hr tablet TAKE 1 TABLET BY MOUTH EVERY DAY WITH 250MG  TABLET FOR TOTAL OF 750MG  Patient taking differently: Take 500 mg by mouth See admin instructions. TAKE 1 TABLET BY MOUTH EVERY DAY WITH 250MG  TABLET FOR TOTAL OF 750MG  11/15/19  Yes Lomax, Amy, NP  famotidine (PEPCID) 20 MG tablet Take 20 mg by mouth daily.  05/28/19  Yes [provider]  ferrous sulfate 325 (65 FE) MG EC tablet Take 325 mg by mouth daily with breakfast.   Yes [provider]  ibuprofen (ADVIL,MOTRIN) 200 MG tablet Take 600 mg by mouth daily as needed for headache or moderate pain.    Yes [provider]  LYCOPENE PO Take 10 mg by mouth daily.    Yes [provider]  Multiple Vitamins-Minerals (ICAPS) CAPS Take 1 capsule by mouth daily.   Yes [provider]  Omega-3 Fatty Acids (FISH OIL) 1000 MG CAPS Take 2,000 mg by mouth daily.   Yes [provider]  Saw Palmetto 450 MG CAPS Take 900 mg by mouth daily.    Yes [provider]  vitamin B-12 (CYANOCOBALAMIN) 1000 MCG tablet Take 1,000 mcg by mouth daily.   Yes [provider]  ZETIA 10 MG tablet Take 10 mg by mouth daily.  05/22/15  Yes [provider]    Physical Exam: Vitals:   11/27/19 1145 11/27/19 1242 11/27/19 1245  BP:  (!) 147/89 (!) 146/86  Pulse: 74    Resp: 16 14 12   SpO2: 100%      Constitutional: NAD, calm, comfortable, on RA, alert, oriented to person only Eyes: PERRL, lids and conjunctivae normal ENMT: Mucous membranes are dry. Posterior pharynx clear of any exudate or lesions.Normal dentition.  Neck: normal, supple, no masses, no  thyromegaly Respiratory: clear to auscultation bilaterally, no wheezing, no crackles. Normal respiratory effort. No accessory muscle use.  Cardiovascular: Regular rate and rhythm, no murmurs / rubs / gallops. No extremity edema. 2+ pedal pulses. No carotid bruits.  Abdomen: no tenderness, no masses palpated. No hepatosplenomegaly. Bowel sounds positive.  Musculoskeletal: no clubbing / cyanosis. No joint deformity upper and lower extremities. Good ROM, no contractures. Normal muscle tone.  Skin: no rashes, lesions, ulcers. No induration Neurologic: Alert, oriented to person only, following commands.  Sensation intact, DTR normal. Strength 5/5 in all 4.    Labs on Admission: I have  personally reviewed following labs and imaging studies  CBC: Recent Labs  Lab 11/27/19 1143 11/27/19 1200  WBC 9.7  --   HGB 11.1* 11.6*  HCT 33.9* 34.0*  MCV 95.5  --   PLT 177  --    Basic Metabolic Panel: Recent Labs  Lab 11/27/19 1143 11/27/19 1200  NA 139 137  K 4.1 3.8  CL 100 100  CO2 27  --   GLUCOSE 111* 105*  BUN 15 19  CREATININE 1.05 0.90  CALCIUM 9.2  --    GFR: Estimated Creatinine Clearance: 48.4 mL/min (by C-G formula based on SCr of 0.9 mg/dL). Liver Function Tests: Recent Labs  Lab 11/27/19 1143  AST 48*  ALT 18  ALKPHOS 84  BILITOT 0.8  PROT 6.8  ALBUMIN 3.8   No results for input(s): LIPASE, AMYLASE in the last 168 hours. No results for input(s): AMMONIA in the last 168 hours. Coagulation Profile: Recent Labs  Lab 11/27/19 1143  INR 1.1   Cardiac Enzymes: Recent Labs  Lab 11/27/19 1143  CKTOTAL 1,747*   BNP (last 3 results) No results for input(s): PROBNP in the last 8760 hours. HbA1C: No results for input(s): HGBA1C in the last 72 hours. CBG: No results for input(s): GLUCAP in the last 168 hours. Lipid Profile: No results for input(s): CHOL, HDL, LDLCALC, TRIG, CHOLHDL, LDLDIRECT in the last 72 hours. Thyroid Function Tests: No results for input(s):  TSH, T4TOTAL, FREET4, T3FREE, THYROIDAB in the last 72 hours. Anemia Panel: No results for input(s): VITAMINB12, FOLATE, FERRITIN, TIBC, IRON, RETICCTPCT in the last 72 hours. Urine analysis:    Component Value Date/Time   COLORURINE YELLOW 08/08/2019 1545   APPEARANCEUR CLEAR 08/08/2019 1545   LABSPEC 1.017 08/08/2019 1545   PHURINE 8.0 08/08/2019 1545   GLUCOSEU NEGATIVE 08/08/2019 1545   HGBUR NEGATIVE 08/08/2019 1545   BILIRUBINUR NEGATIVE 08/08/2019 1545   KETONESUR 5 (A) 08/08/2019 1545   PROTEINUR 30 (A) 08/08/2019 1545   NITRITE NEGATIVE 08/08/2019 1545   LEUKOCYTESUR NEGATIVE 08/08/2019 1545    Radiological Exams on Admission: CT Head Wo Contrast  Result Date: 11/27/2019 CLINICAL DATA:  Unwitnessed fall EXAM: CT HEAD WITHOUT CONTRAST TECHNIQUE: Contiguous axial images were obtained from the base of the skull through the vertex without intravenous contrast. COMPARISON:  08/08/2019 FINDINGS: Brain: No evidence of acute infarction, hemorrhage, hydrocephalus, extra-axial collection or mass lesion/mass effect. Extensive low-density changes within the periventricular and subcortical white matter compatible with chronic microvascular ischemic change. Moderate diffuse cerebral volume loss. Vascular: Atherosclerotic calcifications involving the large vessels of the skull base. No unexpected hyperdense vessel. Skull: Normal. Negative for fracture or focal lesion. Sinuses/Orbits: No acute finding. Other: None. IMPRESSION: 1.  No acute intracranial findings. 2.  Chronic microvascular ischemic change and cerebral volume loss. Electronically Signed   By: Duanne Guess D.O.   On: 11/27/2019 13:06   DG Chest Portable 1 View  Result Date: 11/27/2019 CLINICAL DATA:  Larey Seat yesterday. Denies chest pain or shortness of breath. EXAM: PORTABLE CHEST 1 VIEW COMPARISON:  Chest x-ray dated August 08, 2019. FINDINGS: The heart size and mediastinal contours are within normal limits. Loop recorder again  noted. Normal pulmonary vascularity. Minimal bibasilar atelectasis with unchanged scarring at the left lung base. No focal consolidation, pleural effusion, or pneumothorax. No acute osseous abnormality. Unchanged large hiatal hernia. IMPRESSION: No active disease. Electronically Signed   By: Obie Dredge M.D.   On: 11/27/2019 12:19    EKG: Independently reviewed.  Sinus rhythm, atrial  premature complexes.  No ST elevation or depression noted.  Assessment/Plan Principal Problem:   Syncope Active Problems:   HLD (hyperlipidemia)   Normocytic anemia   Essential (primary) hypertension   Seizures (HCC)   Elevated CK   Syncope: -Could be due to underlying complex partial seizure.  Patient's vital signs stable.  Afebrile with no leukocytosis.  Chest x-ray is negative for pneumonia.  CT head is negative for acute findings.  Ethanol, INR: WNL, COVID-19 and UA: Pending -Admit patient on the floor.  On telemetry -Check Depakote level, TSH, ammonia level, UDS, B12, folate -Loop recorder interrogation is pending -We will get transthoracic echo -Keep him n.p.o. -PT/ST/OT consult -On fall precautions -Fall and aspiration precautions -Continue Depakote  Elevated CK: -Secondary to rhabdo.  Kidney function: WNL -Continue IV fluids.  Monitor kidney function closely  Elevated troponin: Initial troponin 42.  Patient denies any ACS symptoms.  EKG: No acute changes.  Will trend troponin.  Normocytic anemia: H&H is stable -Continue to monitor  History of stroke: -Has loop recorder which was placed on 09/16/2016 due to history of palpitation, symptoms concerning for TIA -Continue aspirin, Zetia, Plavix  Hyperlipidemia: -Continue Zetia  GERD: Continue Pepcid  DVT prophylaxis: Lovenox/SCD/TED Code Status: DNR-confirmed with patient and his daughter Family Communication: Patient's daughter present at bedside.  Plan of care discussed with patient and his daughter at bedside in length and they  verbalized understanding and agreed with it. Disposition Plan: To be determined Consults called: None Admission status: Inpatient   Ollen Bowl MD [facility] Pager [phone]  If 7PM-7AM, please contact night-coverage [email or web address] Password [email or web address]  11/27/2019, 2:20 PM

## 2019-11-28 ENCOUNTER — Inpatient Hospital Stay (HOSPITAL_COMMUNITY): Payer: Medicare Other

## 2019-11-28 DIAGNOSIS — I340 Nonrheumatic mitral (valve) insufficiency: Secondary | ICD-10-CM

## 2019-11-28 DIAGNOSIS — E785 Hyperlipidemia, unspecified: Secondary | ICD-10-CM

## 2019-11-28 DIAGNOSIS — I350 Nonrheumatic aortic (valve) stenosis: Secondary | ICD-10-CM

## 2019-11-28 DIAGNOSIS — I10 Essential (primary) hypertension: Secondary | ICD-10-CM

## 2019-11-28 DIAGNOSIS — R748 Abnormal levels of other serum enzymes: Secondary | ICD-10-CM

## 2019-11-28 DIAGNOSIS — R55 Syncope and collapse: Secondary | ICD-10-CM

## 2019-11-28 DIAGNOSIS — R569 Unspecified convulsions: Secondary | ICD-10-CM

## 2019-11-28 LAB — CBC
HCT: 32.8 % — ABNORMAL LOW (ref 39.0–52.0)
Hemoglobin: 10.7 g/dL — ABNORMAL LOW (ref 13.0–17.0)
MCH: 31.4 pg (ref 26.0–34.0)
MCHC: 32.6 g/dL (ref 30.0–36.0)
MCV: 96.2 fL (ref 80.0–100.0)
Platelets: 170 10*3/uL (ref 150–400)
RBC: 3.41 MIL/uL — ABNORMAL LOW (ref 4.22–5.81)
RDW: 13.2 % (ref 11.5–15.5)
WBC: 8.5 10*3/uL (ref 4.0–10.5)
nRBC: 0 % (ref 0.0–0.2)

## 2019-11-28 LAB — COMPREHENSIVE METABOLIC PANEL
ALT: 18 U/L (ref 0–44)
AST: 56 U/L — ABNORMAL HIGH (ref 15–41)
Albumin: 3.2 g/dL — ABNORMAL LOW (ref 3.5–5.0)
Alkaline Phosphatase: 77 U/L (ref 38–126)
Anion gap: 10 (ref 5–15)
BUN: 16 mg/dL (ref 8–23)
CO2: 26 mmol/L (ref 22–32)
Calcium: 8.5 mg/dL — ABNORMAL LOW (ref 8.9–10.3)
Chloride: 103 mmol/L (ref 98–111)
Creatinine, Ser: 1.11 mg/dL (ref 0.61–1.24)
GFR calc Af Amer: >60 mL/min (ref >60)
GFR calc non Af Amer: 59 mL/min — ABNORMAL LOW (ref >60)
Glucose, Bld: 81 mg/dL (ref 70–99)
Potassium: 3.7 mmol/L (ref 3.5–5.1)
Sodium: 139 mmol/L (ref 135–145)
Total Bilirubin: 0.7 mg/dL (ref 0.3–1.2)
Total Protein: 5.7 g/dL — ABNORMAL LOW (ref 6.5–8.1)

## 2019-11-28 LAB — CBG MONITORING, ED: Glucose-Capillary: 67 mg/dL — ABNORMAL LOW (ref 70–99)

## 2019-11-28 LAB — LACTIC ACID, PLASMA: Lactic Acid, Venous: 1.1 mmol/L (ref 0.5–1.9)

## 2019-11-28 LAB — GLUCOSE, CAPILLARY
Glucose-Capillary: 112 mg/dL — ABNORMAL HIGH (ref 70–99)
Glucose-Capillary: 59 mg/dL — ABNORMAL LOW (ref 70–99)
Glucose-Capillary: 123 mg/dL — ABNORMAL HIGH (ref 70–99)
Glucose-Capillary: 81 mg/dL (ref 70–99)

## 2019-11-28 LAB — ECHOCARDIOGRAM COMPLETE
Height: 66 in
Weight: 2243.4 oz

## 2019-11-28 NOTE — Progress Notes (Signed)
  Echocardiogram 2D Echocardiogram has been performed.  Dorena Dew Cherye Gaertner 11/28/2019, 4:06 PM

## 2019-11-28 NOTE — Progress Notes (Signed)
PROGRESS NOTE    Nicholas Gaines  HKV:[id number] DOB: 03-01-1929 DOA: 11/27/2019 PCP: Antony Contras, MD   Brief Narrative:  Nicholas Gaines is a 86 y.o. male with medical history significant of stroke with minimal residual weakness-has loop recorder, aortic valve disease, hyperlipidemia, GERD, gout, complex partial seizure disorder, presents to ER due to fall that occurred probably between 5 PM last night and 10 AM this morning. Daughter is the historian who reports that patient was doing fine at 5 PM yesterday.  She went to check on him again this morning at 10 AM and found him on the kitchen floor, unknown downtime.  Patient does not remember falling, he is unsure how he fell. He denies headache, blurry vision, slurred speech, head trauma, lightheadedness, dizziness, facial droop, numbness weakness tingling sensation in hands or feet, chest pain, shortness of breath, palpitation, nausea, vomiting, diarrhea, fever, chills, cough, congestion, urinary or bowel changes. Daughter thinks that patient fall likely due to seizure.  He takes Depakote 750 mg at home and he missed last night dose.  This morning she noted that patient was confused and does not remember anything about the fall.  He has loop recorder which was placed about 3 years ago for the concern of palpitation and symptoms concerning for TIA.  His last interrogation was done on last Thursday which was negative. He lives alone at home.  Uses walker and cane for ambulation.  No history of smoking, alcohol, illicit drug use. Upon arrival to ED: Patient's vital signs stable.  Afebrile with no leukocytosis.  Troponin: 42, CK: 1747, ethanol, INR: WNL, chest x-ray and CT head without contrast came back negative for acute findings.  UA and COVID-19 pending.  Patient received IV fluid bolus in ED.  [facility] hospitalist consulted for admission for syncope.   Assessment & Plan:   Principal Problem:   Syncope Active Problems:   HLD (hyperlipidemia)  Normocytic anemia   Essential (primary) hypertension   Seizures (HCC)   Elevated CK  Syncope, concern for possible breakthrough seizure, POA: -Continue as inpatient given ambulatory dysfunction as below, unclear if acutely related to syncope -Depakote level low, slightly improved from previous baseline, no change in recent antiepileptic therapy due to intolerance of medication changes per neuro outpatient note -Patient is other labs including lactic acid, UDS, TSH within normal limits -Echocardiogram completed, pending formal read -Loop recorder interrogation is pending -PT/ST/OT consult -Continue fall precautions, aspiration precautions, high risk for delirium given age and comorbid conditions -Continue home Depakote at previous dosing  Acute ambulatory dysfunction, elevated CK in the setting of fall, rule out rhabdomyolysis Lab Results  Component Value Date   CKTOTAL 1,747 (H) 11/27/2019  -Patient received IV fluids in the ED, continues to tolerate p.o. quite well at this point  -Unclear if patient's ambulatory dysfunction is in the setting of above possible seizure with Todd's paralysis although this would more likely be unilateral rather than bilateral lower extremity weakness, patient's rhabdomyolysis may be causing patient's ambulatory dysfunction or there could be tertiary underlying cause yet discovered. -Continue to evaluate for possible infectious etiology however patient remains without fever, without leukocytosis.  No acute symptoms noted per the patient.  Minimally elevated troponin, likely in the setting of above  - Troponin flat at 42, 43, no chest pain, nausea, diaphoresis. -EKG without overt ST elevation or depression -Continue to follow clinically  Normocytic anemia, likely chronic anemia of chronic disease -Continue to monitor  History of stroke: -Has loop recorder which was placed on 09/16/2016  due to history of palpitation, symptoms concerning for TIA -Continue  aspirin, Zetia, Plavix  Hyperlipidemia: -Continue Zetia  GERD: Continue Pepcid  DVT prophylaxis: Lovenox/SCD/TED Code Status: DNR-confirmed with patient and his daughter at admission Family Communication: Patient's daughter present at bedside Disposition Plan: To be determined, likely discharge home pending further work-up, evaluation, possible imaging in the next 24 to 48 hours.  Patient continues to require close monitoring given acute ambulatory dysfunction in the setting of syncope with concern for breakthrough seizure.  Patient is at high risk for decompensation at this time Consults called: None Admission status: Inpatient   Subjective: No acute issues or events overnight, patient denies chest pain, shortness of breath, nausea, vomiting, diarrhea, constipation, headache, fevers, chills.  Objective: Vitals:   11/28/19 0301 11/28/19 0400 11/28/19 0500 11/28/19 0600  BP: (!) 161/81 (!) 145/78 (!) 150/95 (!) 156/93  Pulse: (!) 58 (!) 55 (!) 59 (!) 58  Resp: 15 17 10 14   Temp:      TempSrc:      SpO2: 100% 99% 100% 99%    Intake/Output Summary (Last 24 hours) at 11/28/2019 0655 Last data filed at 11/28/2019 0203 Gross per 24 hour  Intake 1000 ml  Output --  Net 1000 ml   There were no vitals filed for this visit.  Examination:  General:  Pleasantly resting in bed, No acute distress. HEENT:  Normocephalic atraumatic.  Sclerae nonicteric, noninjected.  Extraocular movements intact bilaterally. Neck:  Without mass or deformity.  Trachea is midline. Lungs:  Clear to auscultate bilaterally without rhonchi, wheeze, or rales. Heart:  Regular rate and rhythm.  Without murmurs, rubs, or gallops. Abdomen:  Soft, nontender, nondistended.  Without guarding or rebound. Extremities: Without cyanosis, clubbing, edema, or obvious deformity. Vascular:  Dorsalis pedis and posterior tibial pulses palpable bilaterally. Skin:  Warm and dry, no erythema, no ulcerations.   Data  Reviewed: I have personally reviewed following labs and imaging studies  CBC: Recent Labs  Lab 11/27/19 1143 11/27/19 1200 11/27/19 1444 11/28/19 0335  WBC 9.7  --  9.2 8.5  HGB 11.1* 11.6* 10.4* 10.7*  HCT 33.9* 34.0* 32.1* 32.8*  MCV 95.5  --  96.7 96.2  PLT 177  --  163 170   Basic Metabolic Panel: Recent Labs  Lab 11/27/19 1143 11/27/19 1200 11/27/19 1444 11/28/19 0335  NA 139 137  --  139  K 4.1 3.8  --  3.7  CL 100 100  --  103  CO2 27  --   --  26  GLUCOSE 111* 105*  --  81  BUN 15 19  --  16  CREATININE 1.05 0.90 1.01 1.11  CALCIUM 9.2  --   --  8.5*  MG  --   --  1.8  --   PHOS  --   --  3.4  --    GFR: Estimated Creatinine Clearance: 39.2 mL/min (by C-G formula based on SCr of 1.11 mg/dL). Liver Function Tests: Recent Labs  Lab 11/27/19 1143 11/28/19 0335  AST 48* 56*  ALT 18 18  ALKPHOS 84 77  BILITOT 0.8 0.7  PROT 6.8 5.7*  ALBUMIN 3.8 3.2*   No results for input(s): LIPASE, AMYLASE in the last 168 hours. Recent Labs  Lab 11/27/19 1455  AMMONIA <9*   Coagulation Profile: Recent Labs  Lab 11/27/19 1143  INR 1.1   Cardiac Enzymes: Recent Labs  Lab 11/27/19 1143  CKTOTAL 1,747*   BNP (last 3 results) No results for input(s):  PROBNP in the last 8760 hours. HbA1C: No results for input(s): HGBA1C in the last 72 hours. CBG: Recent Labs  Lab 11/28/19 0448  GLUCAP 67*   Lipid Profile: No results for input(s): CHOL, HDL, LDLCALC, TRIG, CHOLHDL, LDLDIRECT in the last 72 hours. Thyroid Function Tests: Recent Labs    11/27/19 1455  TSH 4.094   Anemia Panel: Recent Labs    11/27/19 1505  VITAMINB12 2,819*   Sepsis Labs: Recent Labs  Lab 11/27/19 1455 11/28/19 0333  LATICACIDVEN 1.1 1.1    Recent Results (from the past 240 hour(s))  SARS CORONAVIRUS 2 (TAT 6-24 HRS) Nasopharyngeal Nasopharyngeal Swab     Status: None   Collection Time: 11/27/19  1:15 PM   Specimen: Nasopharyngeal Swab  Result Value Ref Range Status    SARS Coronavirus 2 NEGATIVE NEGATIVE Final    Comment: (NOTE) SARS-CoV-2 target nucleic acids are NOT DETECTED. The SARS-CoV-2 RNA is generally detectable in upper and lower respiratory specimens during the acute phase of infection. Negative results do not preclude SARS-CoV-2 infection, do not rule out co-infections with other pathogens, and should not be used as the sole basis for treatment or other patient management decisions. Negative results must be combined with clinical observations, patient history, and epidemiological information. The expected result is Negative. Fact Sheet for Patients: HairSlick.no Fact Sheet for Healthcare Providers: quierodirigir.com This test is not yet approved or cleared by the [place] FDA and  has been authorized for detection and/or diagnosis of SARS-CoV-2 by FDA under an Emergency Use Authorization (EUA). This EUA will remain  in effect (meaning this test can be used) for the duration of the COVID-19 declaration under Section 56 4(b)(1) of the Act, 21 U.S.C. section 360bbb-3(b)(1), unless the authorization is terminated or revoked sooner. Performed at [facility], [address] [place], [place] [address]          Radiology Studies: CT Head Wo Contrast  Result Date: 11/27/2019 CLINICAL DATA:  Unwitnessed fall EXAM: CT HEAD WITHOUT CONTRAST TECHNIQUE: Contiguous axial images were obtained from the base of the skull through the vertex without intravenous contrast. COMPARISON:  08/08/2019 FINDINGS: Brain: No evidence of acute infarction, hemorrhage, hydrocephalus, extra-axial collection or mass lesion/mass effect. Extensive low-density changes within the periventricular and subcortical white matter compatible with chronic microvascular ischemic change. Moderate diffuse cerebral volume loss. Vascular: Atherosclerotic calcifications involving the large vessels of the skull base. No  unexpected hyperdense vessel. Skull: Normal. Negative for fracture or focal lesion. Sinuses/Orbits: No acute finding. Other: None. IMPRESSION: 1.  No acute intracranial findings. 2.  Chronic microvascular ischemic change and cerebral volume loss. Electronically Signed   By: Duanne Guess D.O.   On: 11/27/2019 13:06   DG Chest Portable 1 View  Result Date: 11/27/2019 CLINICAL DATA:  Larey Seat yesterday. Denies chest pain or shortness of breath. EXAM: PORTABLE CHEST 1 VIEW COMPARISON:  Chest x-ray dated August 08, 2019. FINDINGS: The heart size and mediastinal contours are within normal limits. Loop recorder again noted. Normal pulmonary vascularity. Minimal bibasilar atelectasis with unchanged scarring at the left lung base. No focal consolidation, pleural effusion, or pneumothorax. No acute osseous abnormality. Unchanged large hiatal hernia. IMPRESSION: No active disease. Electronically Signed   By: Obie Dredge M.D.   On: 11/27/2019 12:19        Scheduled Meds: . aspirin EC  325 mg Oral Daily  . cholecalciferol  1,000 Units Oral Daily  . clopidogrel  75 mg Oral Daily  . divalproex  750  mg Oral Daily  . enoxaparin (LOVENOX) injection  40 mg Subcutaneous Q24H  . ezetimibe  10 mg Oral Daily  . famotidine  20 mg Oral Daily  . ferrous sulfate  325 mg Oral Q breakfast  . multivitamin  1 tablet Oral Daily  . sodium chloride flush  3 mL Intravenous Q12H  . vitamin B-12  1,000 mcg Oral Daily   Continuous Infusions: . sodium chloride 100 mL/hr at 11/28/19 0454     LOS: 1 day    Time spent:   Azucena Fallen, DO [facility]  If 7PM-7AM, please contact night-coverage [email or web address]  11/28/2019, 6:55 AM

## 2019-11-28 NOTE — Progress Notes (Signed)
SLP Cancellation Note  Patient Details Name: Nicholas Gaines MRN: [id number] DOB: 10-Dec-1928   Cancelled treatment:       Reason Eval/Treat Not Completed: Patient at procedure or test/unavailable     Mahala Menghini., M.A. CCC-SLP Acute Rehabilitation Services Pager [phone] Office [phone]  11/28/2019, 3:31 PM

## 2019-11-28 NOTE — Progress Notes (Signed)
PT Cancellation Note  Patient Details Name: Nicholas Gaines MRN: [id number] DOB: 07-Jan-1929   Cancelled Treatment:    Reason Eval/Treat Not Completed: Patient at procedure or test/unavailable.  Will f/u as able. Royetta Asal, PT Acute Rehab Services Pager [phone] [facility] [phone] [facility] [phone]    Rayetta Humphrey 11/28/2019, 3:20 PM

## 2019-11-28 NOTE — ED Notes (Signed)
Pt unable to stand due leg weakness on left side

## 2019-11-29 LAB — CBC
HCT: 32.2 % — ABNORMAL LOW (ref 39.0–52.0)
Hemoglobin: 10.8 g/dL — ABNORMAL LOW (ref 13.0–17.0)
MCH: 31.8 pg (ref 26.0–34.0)
MCHC: 33.5 g/dL (ref 30.0–36.0)
MCV: 94.7 fL (ref 80.0–100.0)
Platelets: 164 10*3/uL (ref 150–400)
RBC: 3.4 MIL/uL — ABNORMAL LOW (ref 4.22–5.81)
RDW: 13.1 % (ref 11.5–15.5)
WBC: 10.9 10*3/uL — ABNORMAL HIGH (ref 4.0–10.5)
nRBC: 0 % (ref 0.0–0.2)

## 2019-11-29 LAB — BASIC METABOLIC PANEL
Anion gap: 9 (ref 5–15)
BUN: 19 mg/dL (ref 8–23)
CO2: 24 mmol/L (ref 22–32)
Calcium: 8.3 mg/dL — ABNORMAL LOW (ref 8.9–10.3)
Chloride: 106 mmol/L (ref 98–111)
Creatinine, Ser: 1.05 mg/dL (ref 0.61–1.24)
GFR calc Af Amer: >60 mL/min (ref >60)
GFR calc non Af Amer: >60 mL/min (ref >60)
Glucose, Bld: 105 mg/dL — ABNORMAL HIGH (ref 70–99)
Potassium: 3.4 mmol/L — ABNORMAL LOW (ref 3.5–5.1)
Sodium: 139 mmol/L (ref 135–145)

## 2019-11-29 LAB — FOLATE RBC
Folate, Hemolysate: 487 ng/mL
Folate, RBC: 1517 ng/mL (ref >498)
Hematocrit: 32.1 % — ABNORMAL LOW (ref 37.5–51.0)

## 2019-11-29 LAB — GLUCOSE, CAPILLARY
Glucose-Capillary: 155 mg/dL — ABNORMAL HIGH (ref 70–99)
Glucose-Capillary: 98 mg/dL (ref 70–99)
Glucose-Capillary: 113 mg/dL — ABNORMAL HIGH (ref 70–99)

## 2019-11-29 MED ORDER — AMLODIPINE BESYLATE 5 MG PO TABS
5.0000 mg | ORAL_TABLET | Freq: Every day | ORAL | Status: DC
  Administered 2019-11-29 – 2019-12-01 (×3): 5 mg via ORAL
  Filled 2019-11-29 (×3): qty 1

## 2019-11-29 NOTE — TOC Initial Note (Signed)
Transition of Care [facility]) - Initial/Assessment Note    Patient Details  Name: Nicholas Gaines MRN: [id number] Date of Birth: 07-11-1929  Transition of Care [facility]) CM/SW Contact:    Pollie Friar, RN Phone Number: 11/29/2019, 2:10 PM  Clinical Narrative:                 Recommendations are for SNF. CM met with the patient and his daughter: Jeannene Patella. They are in agreement with SNF rehab. Daughter provided Medicare.gov list and was in agreement to having pt faxed out in the [facility] area. FL2 completed and faxed out. Will updated daughter in am of bed choices.  TOC following.  Expected Discharge Plan: Skilled Nursing Facility Barriers to Discharge: Continued Medical Work up   Patient Goals and CMS Choice   CMS Medicare.gov Compare Post Acute Care list provided to:: Patient Represenative (must comment) Choice offered to / list presented to : Adult Children(daughter: [name])  Expected Discharge Plan and Services Expected Discharge Plan: [place] In-house Referral: Clinical Social Work Discharge Planning Services: CM Consult Post Acute Care Choice: [place] arrangements for the past 2 months: [place]                                      Prior Living Arrangements/Services Living arrangements for the past 2 months: Single Family Home Lives with:: Self Patient language and need for interpreter reviewed:: Yes Do you feel safe going back to the place where you live?: Yes      Need for Family Participation in Patient Care: Yes (Comment) Care giver support system in place?: No (comment)(Pt lives alone)   Criminal Activity/Legal Involvement Pertinent to Current Situation/Hospitalization: No - Comment as needed  Activities of Daily Living Home Assistive Devices/Equipment: Cane (specify quad or straight), Walker (specify type) ADL Screening (condition at time of admission) Patient's cognitive ability adequate to safely complete  daily activities?: Yes Is the patient deaf or have difficulty hearing?: No Does the patient have difficulty seeing, even when wearing glasses/contacts?: No Does the patient have difficulty concentrating, remembering, or making decisions?: No Patient able to express need for assistance with ADLs?: Yes Does the patient have difficulty dressing or bathing?: No Independently performs ADLs?: Yes (appropriate for developmental age) Does the patient have difficulty walking or climbing stairs?: No Weakness of Legs: Both Weakness of Arms/Hands: None  Permission Sought/Granted                  Emotional Assessment Appearance:: Appears stated age Attitude/Demeanor/Rapport: Other (comment)(quiet) Affect (typically observed): Accepting Orientation: : Oriented to Self, Oriented to Place   Psych Involvement: No (comment)  Admission diagnosis:  Syncope [R55] Elevated CK [R74.8] Fall, initial encounter [W19.XXXA] History of seizure [Z87.898] Patient Active Problem List   Diagnosis Date Noted  . Syncope   . Elevated CK   . Generalized weakness 08/09/2019  . Seizures ([place]) 06/04/2019  . Complex partial seizure with impairment of consciousness at onset [facility]) 05/29/2018  . Mild cognitive impairment 05/29/2018  . Palpitations 09/16/2016  . Normocytic anemia 09/09/2016  . Aortic valve calcification 09/09/2016  . Aortic valve sclerosis 09/09/2016  . BPH (benign prostatic hyperplasia) 09/09/2016  . Cardiac murmur 09/09/2016  . Essential (primary) hypertension 09/09/2016  . Facial weakness status post cerebrovascular accident 09/09/2016  . H/O: stroke with residual effects 09/09/2016  . History of stroke 09/09/2016  . Prediabetes 09/09/2016  .  Vitamin D deficiency 09/09/2016  . Alteration of consciousness 08/14/2016  . Acute encephalopathy 08/02/2016  . Difficulty speaking 08/02/2016  . Bronchitis 08/02/2016  . HLD (hyperlipidemia) 08/02/2016  . Stroke (HCC)   . GERD (gastroesophageal  reflux disease)   . Gastroesophageal reflux disease without esophagitis   . Transient cerebral ischemia    PCP:  Swayne, David, MD Pharmacy:   [id number] - [place], [place] - [address]  [place] [address] Phone: [phone] Fax: [phone]     Social Determinants of Health (SDOH) Interventions    Readmission Risk Interventions No flowsheet data found.  

## 2019-11-29 NOTE — Evaluation (Signed)
Clinical/Bedside Swallow Evaluation Patient Details  Name: Nicholas Gaines MRN: [id number] Date of Birth: April 27, 1929  Today's Date: 11/29/2019 Time: SLP Start Time (ACUTE ONLY): 0806 SLP Stop Time (ACUTE ONLY): 0820 SLP Time Calculation (min) (ACUTE ONLY): 14 min  Past Medical History:  Past Medical History:  Diagnosis Date  . A-fib ([place])   . Arthritis   . GERD (gastroesophageal reflux disease)   . Heart murmur   . HLD (hyperlipidemia)   . Seizures ([place])    last sz 04/17/17  . Stroke [facility])    tia   Past Surgical History:  Past Surgical History:  Procedure Laterality Date  . CARPAL TUNNEL RELEASE     About 10 yr ago (08/13/16)  . LOOP RECORDER INSERTION N/A 09/16/2016   Procedure: Loop Recorder Insertion;  Surgeon: Keeter Grayer, MD;  Location: [facility];  Service: Cardiovascular;  Laterality: N/A;  . TONSILLECTOMY     HPI:  Nicholas Gaines is a 86 y.o. male with medical history significant of stroke with minimal residual weakness-has loop recorder, aortic valve disease, hyperlipidemia, GERD, gout, complex partial seizure disorder, presents to ER due to fall that occurred.     Assessment / Plan / Recommendation Clinical Impression  Pt was seen for a bedside swallow evaluation and he presents with suspected esophageal dysphagia.  Pt was encountered awake/alert in bed and agreeable to this evaluation.  RN and pt both reported that pt was tolerating a regular solid and thin liquid diet without difficulty.  Oral mechanism exam was unremarkable.  Pt consumed 4oz of thin liquid via straw sip and bites of puree without overt s/sx of aspiration.  During the regular solid trials, mastication was functional, but delayed coughing was observed following 2/4 trials.  Eructation was also noted.  Suspect an esophageal etiology, however unable to r/o laryngeal invasion.  Pt reported a hx of GERD and states that he takes medication at home to manage it.  Per chart review, pt also has a hx of  esophageal stricture with dilation (about 20 years ago).  Pt may benefit from a GI consult to further evaluate.  Recommend continuation of regular solids and thin liquids with strict adherence to GERD precautions: 1) Small bites/sips 2) Sit upright as possible 3) Slow rate of intake 4) Remain upright for 30+ minutes after intake.  SLP will briefly f/u to monitor diet tolerance.    Pt was oriented to self, place, city, and situation.  He was not initially oriented to the year (stating that it was 2002), but he was able to select the correct year given a choice of two.  RN additionally reported increased confusion beginning last night.  Pt may benefit from a comprehensive cognitive-linguistic assessment to further evaluate.    SLP Visit Diagnosis: Dysphagia, unspecified (R13.10)    Aspiration Risk  Mild aspiration risk    Diet Recommendation Regular;Thin liquid   Liquid Administration via: Cup;Straw Medication Administration: Whole meds with liquid Supervision: Intermittent supervision to cue for compensatory strategies;Patient able to self feed Compensations: Slow rate;Small sips/bites Postural Changes: Seated upright at 90 degrees;Remain upright for at least 30 minutes after po intake    Other  Recommendations Recommended Consults: Consider GI evaluation Oral Care Recommendations: Oral care BID   Follow up Recommendations None      Frequency and Duration min 1 x/week  2 weeks       Prognosis Prognosis for Safe Diet Advancement: Good      Swallow Study   General HPI: Nicholas Gaines  is a 86 y.o. male with medical history significant of stroke with minimal residual weakness-has loop recorder, aortic valve disease, hyperlipidemia, GERD, gout, complex partial seizure disorder, presents to ER due to fall that occurred.   Type of Study: Bedside Swallow Evaluation Previous Swallow Assessment: BSE on 08/09/2019 Diet Prior to this Study: Regular;Thin liquids Temperature Spikes Noted:  Yes Respiratory Status: Room air History of Recent Intubation: No Behavior/Cognition: Alert;Cooperative;Pleasant mood Oral Cavity Assessment: Within Functional Limits Oral Care Completed by SLP: No Oral Cavity - Dentition: Adequate natural dentition;Other (Comment)(partial dentures top ) Vision: Functional for self-feeding Self-Feeding Abilities: Able to feed self;Needs set up Patient Positioning: Upright in bed Baseline Vocal Quality: Normal Volitional Cough: Strong Volitional Swallow: Able to elicit    Oral/Motor/Sensory Function Overall Oral Motor/Sensory Function: Within functional limits   Ice Chips Ice chips: Not tested   Thin Liquid Thin Liquid: Within functional limits Presentation: Straw    Nectar Thick Nectar Thick Liquid: Not tested   Honey Thick Honey Thick Liquid: Not tested   Puree Puree: Within functional limits Presentation: Spoon   Solid     Solid: Impaired Presentation: Self Fed Pharyngeal Phase Impairments: Cough - Delayed     Villa Herb M.S., CCC-SLP Acute Rehabilitation Services Office: [phone]  Shanon Rosser Antonique Langford 11/29/2019,8:36 AM

## 2019-11-29 NOTE — Progress Notes (Signed)
PROGRESS NOTE    Hilman Kissling  UXL:[id number] DOB: 28-Feb-1929 DOA: 11/27/2019 PCP: Antony Contras, MD   Brief Narrative:  Mitchael Luckey is a 86 y.o. male with medical history significant of stroke with minimal residual weakness-has loop recorder, aortic valve disease, hyperlipidemia, GERD, gout, complex partial seizure disorder, presents to ER due to fall that occurred probably between 5 PM last night and 10 AM this morning. Daughter is the historian who reports that patient was doing fine at 5 PM yesterday.  She went to check on him again this morning at 10 AM and found him on the kitchen floor, unknown downtime.  Patient does not remember falling, he is unsure how he fell. He denies headache, blurry vision, slurred speech, head trauma, lightheadedness, dizziness, facial droop, numbness weakness tingling sensation in hands or feet, chest pain, shortness of breath, palpitation, nausea, vomiting, diarrhea, fever, chills, cough, congestion, urinary or bowel changes. Daughter thinks that patient fall likely due to seizure.  He takes Depakote 750 mg at home and he missed last night dose.  This morning she noted that patient was confused and does not remember anything about the fall.  He has loop recorder which was placed about 3 years ago for the concern of palpitation and symptoms concerning for TIA.  His last interrogation was done on last Thursday which was negative. He lives alone at home.  Uses walker and cane for ambulation.  No history of smoking, alcohol, illicit drug use. Upon arrival to ED: Patient's vital signs stable.  Afebrile with no leukocytosis.  Troponin: 42, CK: 1747, ethanol, INR: WNL, chest x-ray and CT head without contrast came back negative for acute findings.  UA and COVID-19 pending.  Patient received IV fluid bolus in ED.  [facility] consulted for admission for syncope.   Assessment & Plan:   Principal Problem:   Syncope Active Problems:   HLD (hyperlipidemia)  Normocytic anemia   Essential (primary) hypertension   Seizures (HCC)   Elevated CK  Acute metabolic encephalopathy, likely multifactorial with possible syncopal event Cannot rule out breakthrough seizure, POA: - Continue as inpatient given ambulatory dysfunction as below, unclear if acutely related to syncope. - Depakote level low, slightly improved from previous baseline, no change in recent antiepileptic therapy due to intolerance of medication changes per neuro outpatient note and discussion with daughter - Patient is other labs including lactic acid, UDS, TSH within normal limits - Echocardiogram completed, without overt dysfunction - Loop recorder interrogation is pending - PT/ST/OT consulted and following - Continue fall precautions, aspiration precautions, high risk for delirium given age and comorbid conditions - Continue home Depakote at previous dosing  Acute ambulatory dysfunction, elevated CK in the setting of fall, rule out rhabdomyolysis Lab Results  Component Value Date   CKTOTAL 1,747 (H) 11/27/2019  - Patient received IV fluids in the ED, continues to tolerate p.o. quite well at this point  - Unclear if patient's ambulatory dysfunction is in the setting of above possible seizure with Todd's paralysis although this would more likely be unilateral rather than bilateral lower extremity weakness, patient's rhabdomyolysis may be causing patient's ambulatory dysfunction or there could be tertiary underlying cause yet discovered. - Continue to evaluate for possible infectious etiology however patient remains without fever, without leukocytosis.  No acute symptoms noted per the patient.  Minimally elevated troponin, likely in the setting of above  - Troponin flat at 42, 43, no chest pain, nausea, diaphoresis. - EKG without overt ST elevation or depression - Continue  to follow clinically  Normocytic anemia, likely chronic anemia of chronic disease - Continue to  monitor  History of stroke: - Has loop recorder which was placed on 09/16/2016 due to history of palpitation, symptoms concerning for TIA - Continue aspirin, Zetia, Plavix  Hypertension  -Poorly controlled -not currently on any home antihypertensive medications per chart review -Start amlodipine 5, follow along, will uptitrate as necessary  Hyperlipidemia: - Continue Zetia  GERD: Continue Pepcid  DVT prophylaxis: Lovenox/SCD/TED Code Status: DNR-confirmed with patient and his daughter at admission Family Communication:  Lengthy discussion with patient's daughter over the phone Disposition Plan:  Likely SNF at this point given patient's ambulatory dysfunction, mental status baseline unsafe discharge as he lives alone at home.  PT to formally evaluate later today.  Patient's daughter requesting SNF placement given his ongoing weakness and amatory dysfunction which is certainly reasonable. Consults called: None Admission status: Inpatient  Subjective: No acute issues or events overnight per staff, patient denies chest pain, shortness of breath, nausea, vomiting, diarrhea, constipation, headache, fevers, chills but review of systems seems somewhat limited this morning - appears to be pleasantly demented, unclear if near baseline given discussion with daughter.  Objective: Vitals:   11/28/19 2006 11/29/19 0007 11/29/19 0425 11/29/19 0801  BP: 125/76 (!) 164/95 (!) 163/99 (!) 176/99  Pulse: 100 85 86 61  Resp: 18 18 18 18   Temp: 97.8 F (36.6 C) 99.1 F (37.3 C) 97.6 F (36.4 C) 98.3 F (36.8 C)  TempSrc: Oral Oral Oral Oral  SpO2: 99% 97% 98% 99%  Weight:   64.4 kg   Height:        Intake/Output Summary (Last 24 hours) at 11/29/2019 1031 Last data filed at 11/29/2019 0600 Gross per 24 hour  Intake 920 ml  Output 400 ml  Net 520 ml   Filed Weights   11/28/19 1359 11/29/19 0425  Weight: 63.6 kg 64.4 kg    Examination:  General:  Pleasantly resting in bed, No acute  distress.  Alert to person and place only HEENT:  Normocephalic atraumatic.  Sclerae nonicteric, noninjected.  Extraocular movements intact bilaterally. Neck:  Without mass or deformity.  Trachea is midline. Lungs:  Clear to auscultate bilaterally without rhonchi, wheeze, or rales. Heart:  Regular rate and rhythm.  Without murmurs, rubs, or gallops. Abdomen:  Soft, nontender, nondistended.  Without guarding or rebound. Extremities: Without cyanosis, clubbing, edema, or obvious deformity. Vascular:  Dorsalis pedis and posterior tibial pulses palpable bilaterally. Skin:  Warm and dry, no erythema, no ulcerations.   Data Reviewed: I have personally reviewed following labs and imaging studies  CBC: Recent Labs  Lab 11/27/19 1143 11/27/19 1200 11/27/19 1444 11/28/19 0335 11/29/19 0427  WBC 9.7  --  9.2 8.5 10.9*  HGB 11.1* 11.6* 10.4* 10.7* 10.8*  HCT 33.9* 34.0* 32.1* 32.8* 32.2*  MCV 95.5  --  96.7 96.2 94.7  PLT 177  --  163 170 164   Basic Metabolic Panel: Recent Labs  Lab 11/27/19 1143 11/27/19 1200 11/27/19 1444 11/28/19 0335 11/29/19 0427  NA 139 137  --  139 139  K 4.1 3.8  --  3.7 3.4*  CL 100 100  --  103 106  CO2 27  --   --  26 24  GLUCOSE 111* 105*  --  81 105*  BUN 15 19  --  16 19  CREATININE 1.05 0.90 1.01 1.11 1.05  CALCIUM 9.2  --   --  8.5* 8.3*  MG  --   --  1.8  --   --   PHOS  --   --  3.4  --   --    GFR: Estimated Creatinine Clearance: 43 mL/min (by C-G formula based on SCr of 1.05 mg/dL). Liver Function Tests: Recent Labs  Lab 11/27/19 1143 11/28/19 0335  AST 48* 56*  ALT 18 18  ALKPHOS 84 77  BILITOT 0.8 0.7  PROT 6.8 5.7*  ALBUMIN 3.8 3.2*   No results for input(s): LIPASE, AMYLASE in the last 168 hours. Recent Labs  Lab 11/27/19 1455  AMMONIA <9*   Coagulation Profile: Recent Labs  Lab 11/27/19 1143  INR 1.1   Cardiac Enzymes: Recent Labs  Lab 11/27/19 1143  CKTOTAL 1,747*   BNP (last 3 results) No results for  input(s): PROBNP in the last 8760 hours. HbA1C: No results for input(s): HGBA1C in the last 72 hours. CBG: Recent Labs  Lab 11/28/19 0448 11/28/19 1407 11/28/19 1656 11/28/19 1759 11/28/19 2139  GLUCAP 67* 112* 59* 123* 81   Lipid Profile: No results for input(s): CHOL, HDL, LDLCALC, TRIG, CHOLHDL, LDLDIRECT in the last 72 hours. Thyroid Function Tests: Recent Labs    11/27/19 1455  TSH 4.094   Anemia Panel: Recent Labs    11/27/19 1505  VITAMINB12 2,819*   Sepsis Labs: Recent Labs  Lab 11/27/19 1455 11/28/19 0333  LATICACIDVEN 1.1 1.1    Recent Results (from the past 240 hour(s))  SARS CORONAVIRUS 2 (TAT 6-24 HRS) Nasopharyngeal Nasopharyngeal Swab     Status: None   Collection Time: 11/27/19  1:15 PM   Specimen: Nasopharyngeal Swab  Result Value Ref Range Status   SARS Coronavirus 2 NEGATIVE NEGATIVE Final    Comment: (NOTE) SARS-CoV-2 target nucleic acids are NOT DETECTED. The SARS-CoV-2 RNA is generally detectable in upper and lower respiratory specimens during the acute phase of infection. Negative results do not preclude SARS-CoV-2 infection, do not rule out co-infections with other pathogens, and should not be used as the sole basis for treatment or other patient management decisions. Negative results must be combined with clinical observations, patient history, and epidemiological information. The expected result is Negative. Fact Sheet for Patients: HairSlick.no Fact Sheet for Healthcare Providers: quierodirigir.com This test is not yet approved or cleared by the [place] FDA and  has been authorized for detection and/or diagnosis of SARS-CoV-2 by FDA under an Emergency Use Authorization (EUA). This EUA will remain  in effect (meaning this test can be used) for the duration of the COVID-19 declaration under Section 56 4(b)(1) of the Act, 21 U.S.C. section 360bbb-3(b)(1), unless the  authorization is terminated or revoked sooner. Performed at [facility], [address], [place], [place] [address]          Radiology Studies: CT Head Wo Contrast  Result Date: 11/27/2019 CLINICAL DATA:  Unwitnessed fall EXAM: CT HEAD WITHOUT CONTRAST TECHNIQUE: Contiguous axial images were obtained from the base of the skull through the vertex without intravenous contrast. COMPARISON:  08/08/2019 FINDINGS: Brain: No evidence of acute infarction, hemorrhage, hydrocephalus, extra-axial collection or mass lesion/mass effect. Extensive low-density changes within the periventricular and subcortical white matter compatible with chronic microvascular ischemic change. Moderate diffuse cerebral volume loss. Vascular: Atherosclerotic calcifications involving the large vessels of the skull base. No unexpected hyperdense vessel. Skull: Normal. Negative for fracture or focal lesion. Sinuses/Orbits: No acute finding. Other: None. IMPRESSION: 1.  No acute intracranial findings. 2.  Chronic microvascular ischemic change and cerebral volume loss. Electronically Signed   By: Janyth Pupa  Plundo D.O.   On: 11/27/2019 13:06   DG Chest Portable 1 View  Result Date: 11/27/2019 CLINICAL DATA:  Larey Seat yesterday. Denies chest pain or shortness of breath. EXAM: PORTABLE CHEST 1 VIEW COMPARISON:  Chest x-ray dated August 08, 2019. FINDINGS: The heart size and mediastinal contours are within normal limits. Loop recorder again noted. Normal pulmonary vascularity. Minimal bibasilar atelectasis with unchanged scarring at the left lung base. No focal consolidation, pleural effusion, or pneumothorax. No acute osseous abnormality. Unchanged large hiatal hernia. IMPRESSION: No active disease. Electronically Signed   By: Obie Dredge M.D.   On: 11/27/2019 12:19   ECHOCARDIOGRAM COMPLETE  Result Date: 11/28/2019    ECHOCARDIOGRAM REPORT   Patient Name:   PHILLP DOLORES Date of Exam: 11/28/2019 Medical Rec #:  [id number]       Height:       65.0 in Accession #:    [id number]     Weight:       145.0 lb Date of Birth:  05-30-1929       BSA:          1.725 m Patient Age:    89 years       BP:           159/90 mmHg Patient Gender: M              HR:           79 bpm. Exam Location:  Inpatient Procedure: 2D Echo, Cardiac Doppler and Color Doppler Indications:    R55 Syncope  History:        Patient has no prior history of Echocardiogram examinations.                 Stroke, Arrythmias:Atrial Fibrillation, Signs/Symptoms:Murmur;                 Risk Factors:Dyslipidemia and GERD. Seizures.  Sonographer:    Elmarie Shiley Dance Referring Phys: [id number] Kasandra Knudsen PAHWANI IMPRESSIONS  1. Left ventricular ejection fraction, by estimation, is 60 to 65%. The left ventricle has normal function. The left ventricle has no regional wall motion abnormalities. Left ventricular diastolic function could not be evaluated.  2. Right ventricular systolic function is normal. The right ventricular size is normal. There is normal pulmonary artery systolic pressure. The estimated right ventricular systolic pressure is 25.7 mmHg.  3. Left atrial size was moderately dilated.  4. The mitral valve is degenerative. Mild mitral valve regurgitation. No evidence of mitral stenosis.  5. The aortic valve is tricuspid. Aortic valve regurgitation is trivial. Moderate aortic valve stenosis. Aortic valve area, by VTI measures 1.02 cm. Aortic valve mean gradient measures 24.0 mmHg. Aortic valve Vmax measures 3.19 m/s.  6. The inferior vena cava is normal in size with greater than 50% respiratory variability, suggesting right atrial pressure of 3 mmHg. FINDINGS  Left Ventricle: Left ventricular ejection fraction, by estimation, is 60 to 65%. The left ventricle has normal function. The left ventricle has no regional wall motion abnormalities. The left ventricular internal cavity size was normal in size. There is  no left ventricular hypertrophy. Left ventricular diastolic function could not  be evaluated due to mitral annular calcification (moderate or greater). Left ventricular diastolic function could not be evaluated. Right Ventricle: The right ventricular size is normal. No increase in right ventricular wall thickness. Right ventricular systolic function is normal. There is normal pulmonary artery systolic pressure. The tricuspid regurgitant velocity is 2.38 m/s, and  with an assumed right atrial pressure  of 3 mmHg, the estimated right ventricular systolic pressure is 25.7 mmHg. Left Atrium: Left atrial size was moderately dilated. Right Atrium: Right atrial size was normal in size. Pericardium: Trivial pericardial effusion is present. Presence of pericardial fat pad. Mitral Valve: The mitral valve is degenerative in appearance. Moderate to severe mitral annular calcification. Mild mitral valve regurgitation. No evidence of mitral valve stenosis. Tricuspid Valve: The tricuspid valve is grossly normal. Tricuspid valve regurgitation is mild . No evidence of tricuspid stenosis. Aortic Valve: The aortic valve is tricuspid. . There is severe thickening and severe calcifcation of the aortic valve. Aortic valve regurgitation is trivial. Aortic regurgitation PHT measures 419 msec. Moderate aortic stenosis is present. There is severe  thickening of the aortic valve. There is severe calcifcation of the aortic valve. Aortic valve mean gradient measures 24.0 mmHg. Aortic valve peak gradient measures 40.7 mmHg. Aortic valve area, by VTI measures 1.02 cm. Pulmonic Valve: The pulmonic valve was grossly normal. Pulmonic valve regurgitation is trivial. No evidence of pulmonic stenosis. Aorta: The aortic root and ascending aorta are structurally normal, with no evidence of dilitation. Venous: The inferior vena cava is normal in size with greater than 50% respiratory variability, suggesting right atrial pressure of 3 mmHg. IAS/Shunts: The atrial septum is grossly normal.  LEFT VENTRICLE PLAX 2D LVIDd:         4.30 cm  LVIDs:         3.20 cm LV PW:         1.20 cm LV IVS:        0.90 cm LVOT diam:     2.00 cm LV SV:         71 LV SV Index:   41 LVOT Area:     3.14 cm  RIGHT VENTRICLE             IVC RV Basal diam:  3.10 cm     IVC diam: 1.50 cm RV Mid diam:    2.40 cm RV S prime:     18.20 cm/s TAPSE (M-mode): 2.7 cm LEFT ATRIUM              Index       RIGHT ATRIUM           Index LA diam:        2.50 cm  1.45 cm/m  RA Area:     14.30 cm LA Vol (A2C):   48.9 ml  28.34 ml/m RA Volume:   31.90 ml  18.49 ml/m LA Vol (A4C):   102.0 ml 59.12 ml/m LA Biplane Vol: 73.4 ml  42.54 ml/m  AORTIC VALVE AV Area (Vmax):    1.05 cm AV Area (Vmean):   0.99 cm AV Area (VTI):     1.02 cm AV Vmax:           319.00 cm/s AV Vmean:          214.000 cm/s AV VTI:            0.692 m AV Peak Grad:      40.7 mmHg AV Mean Grad:      24.0 mmHg LVOT Vmax:         107.00 cm/s LVOT Vmean:        67.600 cm/s LVOT VTI:          0.225 m LVOT/AV VTI ratio: 0.33 AI PHT:            419 msec  AORTA Ao Root diam: 3.80 cm  Ao Asc diam:  3.40 cm MITRAL VALVE                TRICUSPID VALVE MV Area (PHT): 2.20 cm     TR Peak grad:   22.7 mmHg MV Decel Time: 345 msec     TR Vmax:        238.00 cm/s MV E velocity: 87.90 cm/s MV A velocity: 136.00 cm/s  SHUNTS MV E/A ratio:  0.65         Systemic VTI:  0.22 m                             Systemic Diam: 2.00 cm Lennie Odor MD Electronically signed by Lennie Odor MD Signature Date/Time: 11/28/2019/5:28:43 PM    Final    Scheduled Meds: . aspirin EC  325 mg Oral Daily  . cholecalciferol  1,000 Units Oral Daily  . clopidogrel  75 mg Oral Daily  . divalproex  750 mg Oral Daily  . enoxaparin (LOVENOX) injection  40 mg Subcutaneous Q24H  . ezetimibe  10 mg Oral Daily  . famotidine  20 mg Oral Daily  . ferrous sulfate  325 mg Oral Q breakfast  . multivitamin  1 tablet Oral Daily  . sodium chloride flush  3 mL Intravenous Q12H  . vitamin B-12  1,000 mcg Oral Daily   Continuous Infusions: . sodium chloride  100 mL/hr at 11/29/19 0221     LOS: 2 days    Time spent:   Azucena Fallen, DO [facility]  If 7PM-7AM, please contact night-coverage [email or web address]  11/29/2019, 10:31 AM

## 2019-11-29 NOTE — [place] FL2 (Signed)
[place] MEDICAID FL2 LEVEL OF CARE SCREENING TOOL     IDENTIFICATION  Patient Name: Nicholas Gaines Birthdate: [birth year] Sex: male Admission Date (Current Location): 11/27/2019  [facility] and [place] Number:  Producer, television/film/video and Address:  [facility], [address], [place], [place] [address]      Provider Number: [id number]  Attending Physician Name and Address:  Azucena Fallen, MD  Relative Name and Phone Number:  Elita Quick [phone]    Current Level of Care: Hospital Recommended Level of Care: Skilled Nursing Facility Prior Approval Number:    Date Approved/Denied:   PASRR Number: [id number]  Discharge Plan: SNF    Current Diagnoses: Patient Active Problem List   Diagnosis Date Noted  . Syncope   . Elevated CK   . Generalized weakness 08/09/2019  . Seizures (HCC) 06/04/2019  . Complex partial seizure with impairment of consciousness at onset [facility]) 05/29/2018  . Mild cognitive impairment 05/29/2018  . Palpitations 09/16/2016  . Normocytic anemia 09/09/2016  . Aortic valve calcification 09/09/2016  . Aortic valve sclerosis 09/09/2016  . BPH (benign prostatic hyperplasia) 09/09/2016  . Cardiac murmur 09/09/2016  . Essential (primary) hypertension 09/09/2016  . Facial weakness status post cerebrovascular accident 09/09/2016  . H/O: stroke with residual effects 09/09/2016  . History of stroke 09/09/2016  . Prediabetes 09/09/2016  . Vitamin D deficiency 09/09/2016  . Alteration of consciousness 08/14/2016  . Acute encephalopathy 08/02/2016  . Difficulty speaking 08/02/2016  . Bronchitis 08/02/2016  . HLD (hyperlipidemia) 08/02/2016  . Stroke (HCC)   . GERD (gastroesophageal reflux disease)   . Gastroesophageal reflux disease without esophagitis   . Transient cerebral ischemia     Orientation RESPIRATION BLADDER Height & Weight     Self, Place  Normal Incontinent Weight: 64.4 kg Height:  5\' 6"  (167.6 cm)  BEHAVIORAL  SYMPTOMS/MOOD NEUROLOGICAL BOWEL NUTRITION STATUS    Convulsions/Seizures(Depakote ER 750 mg daily) Incontinent Diet(regular with thin liquids)  AMBULATORY STATUS COMMUNICATION OF NEEDS Skin   Extensive Assist Verbally Normal                       Personal Care Assistance Level of Assistance  Bathing, Feeding, Dressing Bathing Assistance: Limited assistance Feeding assistance: Independent Dressing Assistance: Limited assistance     Functional Limitations Info  Sight, Hearing, Speech Sight Info: Impaired Hearing Info: Adequate Speech Info: Impaired    SPECIAL CARE FACTORS FREQUENCY  PT (By licensed PT), OT (By licensed OT), Speech therapy     PT Frequency: 5x/wk OT Frequency: 5x/wk     Speech Therapy Frequency: 3x/wk      Contractures Contractures Info: Not present    Additional Factors Info  Code Status, Allergies, Suctioning Needs Code Status Info: DNR Allergies Info: rocephin/ atorvastatin/ pravastatin/ rosuvastatin       Suctioning Needs: oral suction with yankeur   Current Medications (11/29/2019):  This is the current hospital active medication list Current Facility-Administered Medications  Medication Dose Route Frequency Provider Last Rate Last Admin  . 0.9 %  sodium chloride infusion   Intravenous Continuous Pahwani, Rinka R, MD 100 mL/hr at 11/29/19 1244 New Bag/Given (Non-Interop) at 11/29/19 1244  . acetaminophen (TYLENOL) tablet 650 mg  650 mg Oral Q6H PRN Pahwani, Rinka R, MD       Or  . acetaminophen (TYLENOL) suppository 650 mg  650 mg Rectal Q6H PRN Pahwani, Rinka R, MD      . amLODipine (NORVASC) tablet 5 mg  5 mg Oral Daily Little Ishikawa, MD   5 mg at 11/29/19 1243  . aspirin EC tablet 325 mg  325 mg Oral Daily Pahwani, Rinka R, MD   325 mg at 11/29/19 0926  . cholecalciferol (VITAMIN D) tablet 1,000 Units  1,000 Units Oral Daily Pahwani, Michell Heinrich, MD   1,000 Units at 11/29/19 0927  . clopidogrel (PLAVIX) tablet 75 mg  75 mg Oral Daily  Pahwani, Rinka R, MD   75 mg at 11/29/19 0927  . divalproex (DEPAKOTE ER) 24 hr tablet 750 mg  750 mg Oral Daily Pahwani, Rinka R, MD   750 mg at 11/29/19 0928  . enoxaparin (LOVENOX) injection 40 mg  40 mg Subcutaneous Q24H Pahwani, Rinka R, MD   40 mg at 11/29/19 1247  . ezetimibe (ZETIA) tablet 10 mg  10 mg Oral Daily Pahwani, Rinka R, MD   10 mg at 11/29/19 0927  . famotidine (PEPCID) tablet 20 mg  20 mg Oral Daily Pahwani, Rinka R, MD   20 mg at 11/29/19 0932  . ferrous sulfate tablet 325 mg  325 mg Oral Q breakfast Pahwani, Rinka R, MD   325 mg at 11/29/19 0926  . multivitamin (PROSIGHT) tablet 1 tablet  1 tablet Oral Daily Pahwani, Rinka R, MD   1 tablet at 11/29/19 0926  . ondansetron (ZOFRAN) tablet 4 mg  4 mg Oral Q6H PRN Pahwani, Rinka R, MD       Or  . ondansetron (ZOFRAN) injection 4 mg  4 mg Intravenous Q6H PRN Pahwani, Rinka R, MD      . sodium chloride flush (NS) 0.9 % injection 3 mL  3 mL Intravenous Q12H Pahwani, Rinka R, MD   3 mL at 11/29/19 0928  . vitamin B-12 (CYANOCOBALAMIN) tablet 1,000 mcg  1,000 mcg Oral Daily Pahwani, Rinka R, MD   1,000 mcg at 11/29/19 6712     Discharge Medications: Please see discharge summary for a list of discharge medications.  Relevant Imaging Results:  Relevant Lab Results:   Additional Information SS#: [id number]  Pollie Friar, RN

## 2019-11-29 NOTE — Evaluation (Signed)
Physical Therapy Evaluation Patient Details Name: Nicholas Gaines MRN: [id number] DOB: [birth year] Today's Date: 11/29/2019   History of Present Illness  86 y.o. male with medical history significant of stroke with minimal residual weakness-has loop recorder, aortic valve disease, hyperlipidemia, GERD, gout, complex partial seizure disorder, presents to ER due to fall. Patient found on kitchen floor by his daughter (lives alone) and pt with no recall of falling. ?seizure CT head no acute findings, +microvascular ischemic changes  Clinical Impression   Pt admitted with above diagnosis. Patient lives alone and typically independent with use of his rolling walker in the home and his cane when he goes out. He no longer drives. Currently he overall required moderate assistance (1-2 people depending on functional task) and is not safe to return home alone. He is agreeable with further post-acute therapies prior to consider discharge home. (of note, he reports 3-4 falls in the past 6 months).  Pt currently with functional limitations due to the deficits listed below (see PT Problem List). Pt will benefit from skilled PT to increase their independence and safety with mobility to allow discharge to the venue listed below.       Follow Up Recommendations SNF    Equipment Recommendations  Other (comment)(TBA at post-acute venue)    Recommendations for Other Services       Precautions / Restrictions Precautions Precautions: Fall Precaution Comments: reports 3-4 falls in past 6 mos Restrictions Weight Bearing Restrictions: Yes RLE Weight Bearing: Weight bearing as tolerated LLE Weight Bearing: Weight bearing as tolerated      Mobility  Bed Mobility Overal bed mobility: Needs Assistance             General bed mobility comments: at EOB with OT on arrival  Transfers Overall transfer level: Needs assistance Equipment used: Rolling walker (2 wheeled) Transfers: Sit to/from [employer] Sit to Stand: Mod assist Stand pivot transfers: Mod assist;+2 safety/equipment       General transfer comment: Pt stood from EOB with closer to min assist, but perhaps motivated by trying to get to [facility] to have a BM--for which he was incontinent). Standing from [facility] pt with incr posterior lean progressing to posterior and left lean as fatigued  Ambulation/Gait             General Gait Details: unable to assess due to fatigue after standing ~4 minutes for pericare with +2 assist  Stairs            Wheelchair Mobility    Modified Rankin (Stroke Patients Only)       Balance Overall balance assessment: Needs assistance Sitting-balance support: Bilateral upper extremity supported;Feet supported Sitting balance-Leahy Scale: Poor Sitting balance - Comments: initial posterior lean requiring minguard assist Postural control: Posterior lean;Left lateral lean Standing balance support: Bilateral upper extremity supported;During functional activity Standing balance-Leahy Scale: Poor Standing balance comment: strong posterior lean initially, able to progress RW forward and assist pt to weight-shift forward to require minguard assist for several seconds at a time; began to fatigue and required min assist                             Pertinent Vitals/Pain Pain Assessment: No/denies pain    Home Living Family/patient expects to be discharged to:: Private residence Living Arrangements: Alone Available Help at Discharge: Family;Available PRN/intermittently(Daughter -she works part time) Type of Home: House Home Access: Level entry     Home Layout: One level  Home Equipment: Latina Craver - 2 wheels;Shower seat Additional Comments: RW for household ambulation and cane for community mobility.     Prior Function Level of Independence: Independent with assistive device(s)         Comments: Pt independent in all ADL, IADL, and mobility tasks. Pt no longer  drives. Pt reports      Hand Dominance        Extremity/Trunk Assessment   Upper Extremity Assessment Upper Extremity Assessment: Defer to OT evaluation    Lower Extremity Assessment Lower Extremity Assessment: Generalized weakness(grossly 3+ to 4; assist to stand, unable to fully extend kne)    Cervical / Trunk Assessment Cervical / Trunk Assessment: Kyphotic  Communication   Communication: No difficulties  Cognition Arousal/Alertness: Awake/alert Behavior During Therapy: WFL for tasks assessed/performed Overall Cognitive Status: No family/caregiver present to determine baseline cognitive functioning Area of Impairment: Orientation;Memory;Following commands                 Orientation Level: Time(+day, month; not year (2002 & repeated several minutes later)   Memory: Decreased short-term memory Following Commands: Follows one step commands with increased time       General Comments: slow processing, incr time for following commands      General Comments General comments (skin integrity, edema, etc.): Pt able to describe 2 previous falls: 1 outside in his yard on unlevel ground, 1 stepping up onto step and fell backwardds    Exercises     Assessment/Plan    PT Assessment Patient needs continued PT services  PT Problem List Decreased strength;Decreased activity tolerance;Decreased range of motion;Decreased balance;Decreased mobility;Decreased cognition;Decreased knowledge of use of DME       PT Treatment Interventions DME instruction;Gait training;Functional mobility training;Therapeutic activities;Therapeutic exercise;Balance training;Cognitive remediation;Patient/family education    PT Goals (Current goals can be found in the Care Plan section)  Acute Rehab PT Goals Patient Stated Goal: get better PT Goal Formulation: With patient Time For Goal Achievement: 12/13/19 Potential to Achieve Goals: Good    Frequency Min 2X/week   Barriers to discharge  Decreased caregiver support      Co-evaluation               AM-PAC PT "6 Clicks" Mobility  Outcome Measure Help needed turning from your back to your side while in a flat bed without using bedrails?: A Little Help needed moving from lying on your back to sitting on the side of a flat bed without using bedrails?: A Lot Help needed moving to and from a bed to a chair (including a wheelchair)?: A Lot Help needed standing up from a chair using your arms (e.g., wheelchair or bedside chair)?: A Lot Help needed to walk in hospital room?: Total Help needed climbing 3-5 steps with a railing? : Total 6 Click Score: 11    End of Session Equipment Utilized During Treatment: Gait belt Activity Tolerance: Patient limited by fatigue Patient left: in chair;with call bell/phone within reach;with chair alarm set Nurse Communication: Mobility status PT Visit Diagnosis: Unsteadiness on feet (R26.81);Other abnormalities of gait and mobility (R26.89);Repeated falls (R29.6);Muscle weakness (generalized) (M62.81)    Time: 1000-1029 PT Time Calculation (min) (ACUTE ONLY): 29 min   Charges:   PT Evaluation $PT Eval Moderate Complexity: 1 Mod           Arby Barrette, PT Pager [phone]   Rexanne Mano 11/29/2019, 11:17 AM

## 2019-11-29 NOTE — Progress Notes (Signed)
Occupational Therapy Evaluation  Clinical Impression: PTA pt lived alone, independent in all ADL, IADL, and mobility tasks. Pt uses a RW for household ambulation and reports 3-4 falls in the last 6 months. Pt currently independent to max assist for self-care and functional transfer tasks. Pt tolerated sitting EOB ~5+ min, noting occasional retro lean with pt requiring cues and assist to self-correct. Pt able to transfer to/from [facility] and to bedside chair with RW and mod assist x 2. Educated/instructed pt on body positioning and hand placement for transfers with RW noting fair understanding and follow through. Pt tolerated standing ~4 min requiring max assist to complete toilet hygiene and LB bathing following BM incontinence. Pt demonstrates decreased ROM, strength, endurance, balance, sitting/standing tolerance, and activity tolerance impacting ability to complete self-care and functional transfer tasks. Recommend skilled OT services to address above deficits in order to promote function and prevent further decline. Recommend SNF placement for additional rehab prior to discharge home.       11/29/19 0951  OT Visit Information  Last OT Received On 11/29/19  Assistance Needed +2 (to progress mobility)  PT/OT/SLP Co-Evaluation/Treatment Yes  Reason for Co-Treatment For patient/therapist safety;To address functional/ADL transfers  OT goals addressed during session ADL's and self-care;Strengthening/ROM  History of Present Illness 86 y.o. male with medical history significant of stroke with minimal residual weakness-has loop recorder, aortic valve disease, hyperlipidemia, GERD, gout, complex partial seizure disorder, presents to ER due to fall. Patient found on kitchen floor by his daughter (lives alone) and pt with no recall of falling. ?seizure CT head no acute findings, +microvascular ischemic changes  Precautions  Precautions Fall  Home Living  Family/patient expects to be discharged to: Private  residence  Living Arrangements Alone  Available Help at Discharge Family;Available PRN/intermittently (Daughter -she works part time)  Type of Youth worker Access Level entry  Home Layout One level  Bathroom Shower/Tub Walk-in shower  Bathroom Toilet Handicapped height  Home Equipment [place] - quad;Walker - 2 wheels;Shower seat  Additional Comments RW for household ambulation and cane for community mobility.   Prior Function  Level of Independence Independent with assistive device(s)  Comments Pt independent in all ADL, IADL, and mobility tasks. Pt no longer drives. Pt reports 3-4 falls in the last 6 months.    Communication  Communication No difficulties  Pain Assessment  Pain Assessment No/denies pain  Cognition  Arousal/Alertness Awake/alert  Behavior During Therapy WFL for tasks assessed/performed  Overall Cognitive Status No family/caregiver present to determine baseline cognitive functioning  Area of Impairment Orientation;Memory;Following commands  Orientation Level Disoriented to;Time  Memory Decreased short-term memory  Following Commands Follows one step commands with increased time  General Comments Pt pleasant and willing to participate in therapy. Pt required increased time and repeat instructions to complete tasks.  Upper Extremity Assessment  Upper Extremity Assessment RUE deficits/detail;LUE deficits/detail;Generalized weakness  RUE Deficits / Details Shld flex ~45 degrees. Elbow, wrist, and digit ROM WFL. Crepitus with all movement.   LUE Deficits / Details Shld flex ~45 degrees. Elbow, wrist, and digit ROM WFL. Crepitus with all movement.   Lower Extremity Assessment  Lower Extremity Assessment Defer to PT evaluation  ADL  Overall ADL's  Needs assistance/impaired  Eating/Feeding Independent;Sitting  Grooming Supervision/safety;Set up;Sitting  Upper Body Bathing Set up;Supervision/ safety;Sitting  Lower Body Bathing Maximal assistance;Sit to/from stand  Upper  Body Dressing  Minimal assistance;Sitting  Lower Body Dressing Maximal assistance;Sit to/from stand;Sitting/lateral leans  Toilet Transfer Moderate assistance;BSC;+2 for physical assistance  Toileting- Clothing Manipulation and Hygiene Maximal assistance;Sit to/from stand  Toileting - Clothing Manipulation Details (indicate cue type and reason) Following BM  Functional mobility during ADLs Moderate assistance;Rolling walker  General ADL Comments Pt able to transfer to/from [facility] and to bedside chair with RW and mod assist x 1-2 depending on fatigue. Pt unsteady on feet, with retro lean in standing.   Bed Mobility  Overal bed mobility Needs Assistance  Bed Mobility Supine to Sit  Supine to sit Min assist  General bed mobility comments Pt able to initiate task and move BLEs off bed. Assist for trunk. Increased time needed to complete.   Transfers  Overall transfer level Needs assistance  Equipment used Rolling walker (2 wheeled)  Transfers Sit to/from [facility]  Sit to Stand Mod assist  Stand pivot transfers Mod assist;+2 physical assistance  General transfer comment Assist to power up into standing and steady. Increased retro lean and left lateral lean with fatigue.   Balance  Overall balance assessment Needs assistance  Sitting-balance support Feet supported;Bilateral upper extremity supported  Sitting balance-Leahy Scale Poor  Sitting balance - Comments Occasional retro lean requiring cues and assist to self-correct.   Postural control Posterior lean;Left lateral lean  Standing balance support Bilateral upper extremity supported;During functional activity  Standing balance-Leahy Scale Poor  General Comments  General comments (skin integrity, edema, etc.) No signs/symptoms of distress  OT - End of Session  Equipment Utilized During Treatment Gait belt;Rolling walker  Activity Tolerance Patient limited by fatigue  Patient left in chair;with call bell/phone within  reach;with chair alarm set  Nurse Communication Mobility status  OT Assessment  OT Recommendation/Assessment Patient needs continued OT Services  OT Visit Diagnosis Unsteadiness on feet (R26.81);Muscle weakness (generalized) (M62.81)  OT Problem List Decreased strength;Decreased activity tolerance;Impaired balance (sitting and/or standing);Decreased safety awareness;Decreased cognition  OT Plan  OT Frequency (ACUTE ONLY) Min 2X/week  OT Treatment/Interventions (ACUTE ONLY) Self-care/ADL training;Therapeutic exercise;Neuromuscular education;Energy conservation;DME and/or AE instruction;Therapeutic activities;Patient/family education;Balance training  AM-PAC OT "6 Clicks" Daily Activity Outcome Measure (Version 2)  Help from another person eating meals? 4  Help from another person taking care of personal grooming? 3  Help from another person toileting, which includes using toliet, bedpan, or urinal? 2  Help from another person bathing (including washing, rinsing, drying)? 2  Help from another person to put on and taking off regular upper body clothing? 3  Help from another person to put on and taking off regular lower body clothing? 2  6 Click Score 16  OT Recommendation  Follow Up Recommendations SNF;Supervision/Assistance - 24 hour  OT Equipment Other (comment) (TBD at next venue of care)  Acute Rehab OT Goals  Patient Stated Goal to go home  Time For Goal Achievement 12/13/19  Potential to Achieve Goals Good  OT Time Calculation  OT Start Time (ACUTE ONLY) 0948  OT Stop Time (ACUTE ONLY) 1028  OT Time Calculation (min) 40 min  OT General Charges  $OT Visit 1 Visit  OT Evaluation  $OT Eval Moderate Complexity 1 Mod  OT Treatments  $Self Care/Home Management  8-22 mins  Written Expression  Dominant Hand Right   Mauri Brooklyn OTR/L [phone]

## 2019-11-29 NOTE — Progress Notes (Signed)
  Ashley Royalty, RN  Registered Nurse  Critical Care  Progress Notes    Signed  Date of Service:  11/29/2019 7:36 AM          Signed         Show:Clear all [x] Manual[] Template[] Copied  Added by: [x] , RN  [] Hover for details Pt was oriented to person place time and situation but got disoriented and attempted getting OOB without assistance. Also pulled out his PIV line. New one inserted and wrapped. Reoriented and redirected on the use of call bell.  Low bed and floor mat in use for safety. Had BM x 3 last nght. No acute distress this am.          late entry  documented this note earlier during the morning  in a wrong pt 's chart.

## 2019-11-30 LAB — BASIC METABOLIC PANEL
Anion gap: 7 (ref 5–15)
BUN: 19 mg/dL (ref 8–23)
CO2: 26 mmol/L (ref 22–32)
Calcium: 8.2 mg/dL — ABNORMAL LOW (ref 8.9–10.3)
Chloride: 109 mmol/L (ref 98–111)
Creatinine, Ser: 1.07 mg/dL (ref 0.61–1.24)
GFR calc Af Amer: >60 mL/min (ref >60)
GFR calc non Af Amer: >60 mL/min (ref >60)
Glucose, Bld: 101 mg/dL — ABNORMAL HIGH (ref 70–99)
Potassium: 3.5 mmol/L (ref 3.5–5.1)
Sodium: 142 mmol/L (ref 135–145)

## 2019-11-30 LAB — CBC
HCT: 29.7 % — ABNORMAL LOW (ref 39.0–52.0)
Hemoglobin: 9.7 g/dL — ABNORMAL LOW (ref 13.0–17.0)
MCH: 31.6 pg (ref 26.0–34.0)
MCHC: 32.7 g/dL (ref 30.0–36.0)
MCV: 96.7 fL (ref 80.0–100.0)
Platelets: 150 10*3/uL (ref 150–400)
RBC: 3.07 MIL/uL — ABNORMAL LOW (ref 4.22–5.81)
RDW: 13.2 % (ref 11.5–15.5)
WBC: 7.3 10*3/uL (ref 4.0–10.5)
nRBC: 0 % (ref 0.0–0.2)

## 2019-11-30 LAB — GLUCOSE, CAPILLARY
Glucose-Capillary: 92 mg/dL (ref 70–99)
Glucose-Capillary: 98 mg/dL (ref 70–99)

## 2019-11-30 NOTE — Progress Notes (Signed)
Physical Therapy Treatment Patient Details Name: Nicholas Gaines MRN: [id number] DOB: 05/20/1929 Today's Date: 11/30/2019    History of Present Illness 86 y.o. male with medical history significant of stroke with minimal residual weakness-has loop recorder, aortic valve disease, hyperlipidemia, GERD, gout, complex partial seizure disorder, presents to ER due to fall. Patient found on kitchen floor by his daughter (lives alone) and pt with no recall of falling. ?seizure CT head no acute findings, +microvascular ischemic changes    PT Comments    Patient is making progress toward PT goals. Pt requires min-mod A for functional transfer and gait training this session. Given pt's current mobility level and history of falls continue to recommend post acute rehab for further skilled PT services to maximize independence and safety with mobility.     Follow Up Recommendations  SNF     Equipment Recommendations  Other (comment)(TBA at post-acute venue)    Recommendations for Other Services       Precautions / Restrictions Precautions Precautions: Fall Precaution Comments: reports 3-4 falls in past 6 mos    Mobility  Bed Mobility Overal bed mobility: Needs Assistance Bed Mobility: Supine to Sit     Supine to sit: Min guard     General bed mobility comments: min guard for safety  Transfers Overall transfer level: Needs assistance Equipment used: Rolling walker (2 wheeled) Transfers: Sit to/from Stand Sit to Stand: Mod assist         General transfer comment: assist to power up into standing from EOB, recliner, and commode; cues for safe hand placement   Ambulation/Gait Ambulation/Gait assistance: Min assist Gait Distance (Feet): 80 Feet Assistive device: Rolling walker (2 wheeled) Gait Pattern/deviations: Shuffle;Decreased step length - right;Decreased step length - left;Trunk flexed   Gait velocity interpretation: <1.31 ft/sec, indicative of household ambulator General  Gait Details: cues for forward gaze/upright posture, and increased bilat step lengths; pt with narrow BOS and at times stepping on sock of opposite foot   Stairs             Wheelchair Mobility    Modified Rankin (Stroke Patients Only)       Balance Overall balance assessment: Needs assistance Sitting-balance support: Feet supported Sitting balance-Leahy Scale: Fair     Standing balance support: Bilateral upper extremity supported;During functional activity Standing balance-Leahy Scale: Poor                              Cognition Arousal/Alertness: Awake/alert Behavior During Therapy: WFL for tasks assessed/performed Overall Cognitive Status: No family/caregiver present to determine baseline cognitive functioning Area of Impairment: Memory;Following commands                     Memory: Decreased short-term memory Following Commands: Follows one step commands with increased time              Exercises      General Comments        Pertinent Vitals/Pain Pain Assessment: No/denies pain    Home Living                      Prior Function            PT Goals (current goals can now be found in the care plan section) Acute Rehab PT Goals Patient Stated Goal: get better Progress towards PT goals: Progressing toward goals    Frequency    Min 2X/week  PT Plan Current plan remains appropriate    Co-evaluation              AM-PAC PT "6 Clicks" Mobility   Outcome Measure  Help needed turning from your back to your side while in a flat bed without using bedrails?: A Little Help needed moving from lying on your back to sitting on the side of a flat bed without using bedrails?: A Lot Help needed moving to and from a bed to a chair (including a wheelchair)?: A Lot Help needed standing up from a chair using your arms (e.g., wheelchair or bedside chair)?: A Lot Help needed to walk in hospital room?: A Little Help  needed climbing 3-5 steps with a railing? : Total 6 Click Score: 13    End of Session Equipment Utilized During Treatment: Gait belt Activity Tolerance: Patient tolerated treatment well Patient left: in chair;with call bell/phone within reach;with chair alarm set Nurse Communication: Mobility status PT Visit Diagnosis: Unsteadiness on feet (R26.81);Other abnormalities of gait and mobility (R26.89);Repeated falls (R29.6);Muscle weakness (generalized) (M62.81)     Time: 7672-0947 PT Time Calculation (min) (ACUTE ONLY): 44 min  Charges:  $Gait Training: 23-37 mins $Therapeutic Activity: 8-22 mins                     Earney Navy, PTA Acute Rehabilitation Services Pager: [phone] Office: [phone]     Darliss Cheney 11/30/2019, 1:32 PM

## 2019-11-30 NOTE — Plan of Care (Signed)
Plan of care reviewed with pt at bedside. Call bell in reach. Pt up to chair with assist. Pt stable at this time, will continue to monitor.  Problem: Education: Goal: Knowledge of General Education information will improve Description: Including pain rating scale, medication(s)/side effects and non-pharmacologic comfort measures Outcome: Progressing   Problem: Health Behavior/Discharge Planning: Goal: Ability to manage health-related needs will improve Outcome: Progressing   Problem: Clinical Measurements: Goal: Ability to maintain clinical measurements within normal limits will improve Outcome: Progressing Goal: Will remain free from infection Outcome: Progressing Goal: Diagnostic test results will improve Outcome: Progressing Goal: Respiratory complications will improve Outcome: Progressing Goal: Cardiovascular complication will be avoided Outcome: Progressing   Problem: Activity: Goal: Risk for activity intolerance will decrease Outcome: Progressing   Problem: Nutrition: Goal: Adequate nutrition will be maintained Outcome: Progressing   Problem: Coping: Goal: Level of anxiety will decrease Outcome: Progressing   Problem: Elimination: Goal: Will not experience complications related to bowel motility Outcome: Progressing Goal: Will not experience complications related to urinary retention Outcome: Progressing   Problem: Pain Managment: Goal: General experience of comfort will improve Outcome: Progressing   Problem: Safety: Goal: Ability to remain free from injury will improve Outcome: Progressing   Problem: Skin Integrity: Goal: Risk for impaired skin integrity will decrease Outcome: Progressing

## 2019-11-30 NOTE — TOC Progression Note (Signed)
Transition of Care [facility]) - Progression Note    Patient Details  Name: Nicholas Gaines MRN: [id number] Date of Birth: 1929/03/11  [facility] CM/SW Contact  Kermit Balo, RN Phone Number: 11/30/2019, 10:39 AM  Clinical Narrative:    CM has provided bed offers to pts daughter: [name]. She asked for [facility] but they wont have a bed available even over the weekend. Her second choice is [facility]. CM has updated [name] with [facility] and started insurance.  TOC following.     Expected Discharge Plan: Skilled Nursing Facility Barriers to Discharge: Continued Medical Work up  Expected Discharge Plan and Services Expected Discharge Plan: Skilled Nursing Facility In-house Referral: Clinical Social Work Discharge Planning Services: CM Consult Post Acute Care Choice: Skilled Nursing Facility Living arrangements for the past 2 months: Single Family Home                                       Social Determinants of Health (SDOH) Interventions    Readmission Risk Interventions No flowsheet data found.

## 2019-11-30 NOTE — Progress Notes (Signed)
PROGRESS NOTE    Raffaele Derise  IRS:[id number] DOB: Feb 15, 1929 DOA: 11/27/2019 PCP: Antony Contras, MD   Brief Narrative:  Nikolaus Pienta is a 86 y.o. male with medical history significant of stroke with minimal residual weakness-has loop recorder, aortic valve disease, hyperlipidemia, GERD, gout, complex partial seizure disorder, presents to ER due to fall that occurred probably between 5 PM last night and 10 AM this morning. Daughter is the historian who reports that patient was doing fine at 5 PM yesterday.  She went to check on him again this morning at 10 AM and found him on the kitchen floor, unknown downtime.  Patient does not remember falling, he is unsure how he fell. He denies headache, blurry vision, slurred speech, head trauma, lightheadedness, dizziness, facial droop, numbness weakness tingling sensation in hands or feet, chest pain, shortness of breath, palpitation, nausea, vomiting, diarrhea, fever, chills, cough, congestion, urinary or bowel changes. Daughter thinks that patient fall likely due to seizure.  He takes Depakote 750 mg at home and he missed last night dose.  This morning she noted that patient was confused and does not remember anything about the fall.  He has loop recorder which was placed about 3 years ago for the concern of palpitation and symptoms concerning for TIA.  His last interrogation was done on last Thursday which was negative. He lives alone at home.  Uses walker and cane for ambulation.  No history of smoking, alcohol, illicit drug use. Upon arrival to ED: Patient's vital signs stable.  Afebrile with no leukocytosis.  Troponin: 42, CK: 1747, ethanol, INR: WNL, chest x-ray and CT head without contrast came back negative for acute findings.  UA and COVID-19 pending.  Patient received IV fluid bolus in ED.  [facility] consulted for admission for syncope.   Assessment & Plan:   Principal Problem:   Syncope Active Problems:   HLD (hyperlipidemia)  Normocytic anemia   Essential (primary) hypertension   Seizures (HCC)   Elevated CK   Acute metabolic encephalopathy, likely multifactorial with possible syncopal event Cannot rule out breakthrough seizure, POA: - Continue as inpatient given ambulatory dysfunction as below, unclear if acutely related to syncope. - Depakote level low, slightly improved from previous baseline, no change in recent antiepileptic therapy due to intolerance of medication changes per neuro outpatient note and discussion with daughter - Patient is other labs including lactic acid, UDS, TSH within normal limits - Echocardiogram completed, without overt dysfunction - Loop recorder interrogation is pending - PT/ST/OT consulted and following - Continue fall precautions, aspiration precautions, high risk for delirium given age and comorbid conditions - Continue home Depakote at previous dosing  Acute ambulatory dysfunction, elevated CK in the setting of fall, rule out rhabdomyolysis Lab Results  Component Value Date   CKTOTAL 1,747 (H) 11/27/2019  - Patient received IV fluids in the ED, continues to tolerate p.o. quite well at this point  - Unclear if patient's ambulatory dysfunction is in the setting of above possible seizure with Todd's paralysis although this would more likely be unilateral rather than bilateral lower extremity weakness, patient's rhabdomyolysis may be causing patient's ambulatory dysfunction or there could be tertiary underlying cause yet discovered. - Continue to evaluate for possible infectious etiology however patient remains without fever, without leukocytosis.  No acute symptoms noted per the patient.  Minimally elevated troponin, likely in the setting of above  - Troponin flat at 42, 43, no chest pain, nausea, diaphoresis. - EKG without overt ST elevation or depression -  Continue to follow clinically  Normocytic anemia, likely chronic anemia of chronic disease - Continue to  monitor  History of stroke: - Has loop recorder which was placed on 09/16/2016 due to history of palpitation, symptoms concerning for TIA - Continue aspirin, Zetia, Plavix  Hypertension  -Poorly controlled -not currently on any home antihypertensive medications per chart review -Start amlodipine 5, follow along, will uptitrate as necessary  Hyperlipidemia: - Continue Zetia  GERD: Continue Pepcid  DVT prophylaxis: Lovenox/SCD/TED Code Status: DNR-confirmed with patient and his daughter at admission Family Communication:  Lengthy discussion with patient's daughter over the phone Disposition Plan:  Likely SNF at this point given patient's ongoing ambulatory dysfunction, mental status baseline unsafe discharge as he lives alone at home.  PT rightly recommending SNF and patient's daughter is agreeable for this plan once bed can be obtained and approved. Consults called: None Admission status: Inpatient  Subjective: No acute issues or events overnight per staff, patient denies chest pain, shortness of breath, nausea, vomiting, diarrhea, constipation, headache, fevers, chills but review of systems seems somewhat limited appears to be his baseline.  Objective: Vitals:   11/29/19 1945 11/29/19 2052 11/29/19 2359 11/30/19 0321  BP: 121/75  135/81 136/80  Pulse: (!) 102  89 64  Resp: 17  17 18   Temp: 98.5 F (36.9 C)  98.4 F (36.9 C) 98 F (36.7 C)  TempSrc: Oral  Oral Oral  SpO2: 99% 99% 99% 100%  Weight:      Height:        Intake/Output Summary (Last 24 hours) at 11/30/2019 0715 Last data filed at 11/29/2019 1700 Gross per 24 hour  Intake 645 ml  Output --  Net 645 ml   Filed Weights   11/28/19 1359 11/29/19 0425  Weight: 63.6 kg 64.4 kg    Examination:  General:  Pleasantly resting in bed, No acute distress.  Alert to person and place only HEENT:  Normocephalic atraumatic.  Sclerae nonicteric, noninjected.  Extraocular movements intact bilaterally. Neck:  Without  mass or deformity.  Trachea is midline. Lungs:  Clear to auscultate bilaterally without rhonchi, wheeze, or rales. Heart:  Regular rate and rhythm.  Without murmurs, rubs, or gallops. Abdomen:  Soft, nontender, nondistended.  Without guarding or rebound. Extremities: Without cyanosis, clubbing, edema, or obvious deformity. Vascular:  Dorsalis pedis and posterior tibial pulses palpable bilaterally. Skin:  Warm and dry, no erythema, no ulcerations.   Data Reviewed: I have personally reviewed following labs and imaging studies  CBC: Recent Labs  Lab 11/27/19 1143 11/27/19 1143 11/27/19 1200 11/27/19 1200 11/27/19 1444 11/28/19 0333 11/28/19 0335 11/29/19 0427 11/30/19 0357  WBC 9.7  --   --   --  9.2  --  8.5 10.9* 7.3  HGB 11.1*   < > 11.6*  --  10.4*  --  10.7* 10.8* 9.7*  HCT 33.9*   < > 34.0*   < > 32.1* 32.1* 32.8* 32.2* 29.7*  MCV 95.5  --   --   --  96.7  --  96.2 94.7 96.7  PLT 177  --   --   --  163  --  170 164 150   < > = values in this interval not displayed.   Basic Metabolic Panel: Recent Labs  Lab 11/27/19 1143 11/27/19 1143 11/27/19 1200 11/27/19 1444 11/28/19 0335 11/29/19 0427 11/30/19 0357  NA 139  --  137  --  139 139 142  K 4.1  --  3.8  --  3.7  3.4* 3.5  CL 100  --  100  --  103 106 109  CO2 27  --   --   --  GLUCOSE 111*  --  105*  --  81 105* 101*  BUN 15  --  19  --  CREATININE 1.05   < > 0.90 1.01 1.11 1.05 1.07  CALCIUM 9.2  --   --   --  8.5* 8.3* 8.2*  MG  --   --   --  1.8  --   --   --   PHOS  --   --   --  3.4  --   --   --    < > = values in this interval not displayed.   GFR: Estimated Creatinine Clearance: 42.2 mL/min (by C-G formula based on SCr of 1.07 mg/dL). Liver Function Tests: Recent Labs  Lab 11/27/19 1143 11/28/19 0335  AST 48* 56*  ALT 18 18  ALKPHOS 84 77  BILITOT 0.8 0.7  PROT 6.8 5.7*  ALBUMIN 3.8 3.2*   No results for input(s): LIPASE, AMYLASE in the last 168 hours. Recent Labs  Lab  11/27/19 1455  AMMONIA <9*   Coagulation Profile: Recent Labs  Lab 11/27/19 1143  INR 1.1   Cardiac Enzymes: Recent Labs  Lab 11/27/19 1143  CKTOTAL 1,747*   BNP (last 3 results) No results for input(s): PROBNP in the last 8760 hours. HbA1C: No results for input(s): HGBA1C in the last 72 hours. CBG: Recent Labs  Lab 11/28/19 2139 11/29/19 1147 11/29/19 1629 11/29/19 2122 11/30/19 0608  GLUCAP 81 155* 98 113* 92   Lipid Profile: No results for input(s): CHOL, HDL, LDLCALC, TRIG, CHOLHDL, LDLDIRECT in the last 72 hours. Thyroid Function Tests: Recent Labs    11/27/19 1455  TSH 4.094   Anemia Panel: Recent Labs    11/27/19 1505  VITAMINB12 2,819*   Sepsis Labs: Recent Labs  Lab 11/27/19 1455 11/28/19 0333  LATICACIDVEN 1.1 1.1    Recent Results (from the past 240 hour(s))  SARS CORONAVIRUS 2 (TAT 6-24 HRS) Nasopharyngeal Nasopharyngeal Swab     Status: None   Collection Time: 11/27/19  1:15 PM   Specimen: Nasopharyngeal Swab  Result Value Ref Range Status   SARS Coronavirus 2 NEGATIVE NEGATIVE Final    Comment: (NOTE) SARS-CoV-2 target nucleic acids are NOT DETECTED. The SARS-CoV-2 RNA is generally detectable in upper and lower respiratory specimens during the acute phase of infection. Negative results do not preclude SARS-CoV-2 infection, do not rule out co-infections with other pathogens, and should not be used as the sole basis for treatment or other patient management decisions. Negative results must be combined with clinical observations, patient history, and epidemiological information. The expected result is Negative. Fact Sheet for Patients: HairSlick.no Fact Sheet for Healthcare Providers: quierodirigir.com This test is not yet approved or cleared by the [place] FDA and  has been authorized for detection and/or diagnosis of SARS-CoV-2 by FDA under an Emergency Use Authorization  (EUA). This EUA will remain  in effect (meaning this test can be used) for the duration of the COVID-19 declaration under Section 56 4(b)(1) of the Act, 21 U.S.C. section 360bbb-3(b)(1), unless the authorization is terminated or revoked sooner. Performed at [facility], [address] [place], [place] [address]          Radiology Studies: ECHOCARDIOGRAM COMPLETE  Result Date: 11/28/2019    ECHOCARDIOGRAM REPORT  Patient Name:   MOHIT ZIRBES Date of Exam: 11/28/2019 Medical Rec #:  [id number]      Height:       65.0 in Accession #:    [id number]     Weight:       145.0 lb Date of Birth:  1928/08/17       BSA:          1.725 m Patient Age:    89 years       BP:           159/90 mmHg Patient Gender: M              HR:           79 bpm. Exam Location:  Inpatient Procedure: 2D Echo, Cardiac Doppler and Color Doppler Indications:    R55 Syncope  History:        Patient has no prior history of Echocardiogram examinations.                 Stroke, Arrythmias:Atrial Fibrillation, Signs/Symptoms:Murmur;                 Risk Factors:Dyslipidemia and GERD. Seizures.  Sonographer:    Elmarie Shiley Dance Referring Phys: [id number] Kasandra Knudsen PAHWANI IMPRESSIONS  1. Left ventricular ejection fraction, by estimation, is 60 to 65%. The left ventricle has normal function. The left ventricle has no regional wall motion abnormalities. Left ventricular diastolic function could not be evaluated.  2. Right ventricular systolic function is normal. The right ventricular size is normal. There is normal pulmonary artery systolic pressure. The estimated right ventricular systolic pressure is 25.7 mmHg.  3. Left atrial size was moderately dilated.  4. The mitral valve is degenerative. Mild mitral valve regurgitation. No evidence of mitral stenosis.  5. The aortic valve is tricuspid. Aortic valve regurgitation is trivial. Moderate aortic valve stenosis. Aortic valve area, by VTI measures 1.02 cm. Aortic valve mean gradient measures  24.0 mmHg. Aortic valve Vmax measures 3.19 m/s.  6. The inferior vena cava is normal in size with greater than 50% respiratory variability, suggesting right atrial pressure of 3 mmHg. FINDINGS  Left Ventricle: Left ventricular ejection fraction, by estimation, is 60 to 65%. The left ventricle has normal function. The left ventricle has no regional wall motion abnormalities. The left ventricular internal cavity size was normal in size. There is  no left ventricular hypertrophy. Left ventricular diastolic function could not be evaluated due to mitral annular calcification (moderate or greater). Left ventricular diastolic function could not be evaluated. Right Ventricle: The right ventricular size is normal. No increase in right ventricular wall thickness. Right ventricular systolic function is normal. There is normal pulmonary artery systolic pressure. The tricuspid regurgitant velocity is 2.38 m/s, and  with an assumed right atrial pressure of 3 mmHg, the estimated right ventricular systolic pressure is 25.7 mmHg. Left Atrium: Left atrial size was moderately dilated. Right Atrium: Right atrial size was normal in size. Pericardium: Trivial pericardial effusion is present. Presence of pericardial fat pad. Mitral Valve: The mitral valve is degenerative in appearance. Moderate to severe mitral annular calcification. Mild mitral valve regurgitation. No evidence of mitral valve stenosis. Tricuspid Valve: The tricuspid valve is grossly normal. Tricuspid valve regurgitation is mild . No evidence of tricuspid stenosis. Aortic Valve: The aortic valve is tricuspid. . There is severe thickening and severe calcifcation of the aortic valve. Aortic valve regurgitation is trivial. Aortic regurgitation PHT measures 419 msec. Moderate aortic stenosis is present. There is  severe  thickening of the aortic valve. There is severe calcifcation of the aortic valve. Aortic valve mean gradient measures 24.0 mmHg. Aortic valve peak gradient  measures 40.7 mmHg. Aortic valve area, by VTI measures 1.02 cm. Pulmonic Valve: The pulmonic valve was grossly normal. Pulmonic valve regurgitation is trivial. No evidence of pulmonic stenosis. Aorta: The aortic root and ascending aorta are structurally normal, with no evidence of dilitation. Venous: The inferior vena cava is normal in size with greater than 50% respiratory variability, suggesting right atrial pressure of 3 mmHg. IAS/Shunts: The atrial septum is grossly normal.  LEFT VENTRICLE PLAX 2D LVIDd:         4.30 cm LVIDs:         3.20 cm LV PW:         1.20 cm LV IVS:        0.90 cm LVOT diam:     2.00 cm LV SV:         71 LV SV Index:   41 LVOT Area:     3.14 cm  RIGHT VENTRICLE             IVC RV Basal diam:  3.10 cm     IVC diam: 1.50 cm RV Mid diam:    2.40 cm RV S prime:     18.20 cm/s TAPSE (M-mode): 2.7 cm LEFT ATRIUM              Index       RIGHT ATRIUM           Index LA diam:        2.50 cm  1.45 cm/m  RA Area:     14.30 cm LA Vol (A2C):   48.9 ml  28.34 ml/m RA Volume:   31.90 ml  18.49 ml/m LA Vol (A4C):   102.0 ml 59.12 ml/m LA Biplane Vol: 73.4 ml  42.54 ml/m  AORTIC VALVE AV Area (Vmax):    1.05 cm AV Area (Vmean):   0.99 cm AV Area (VTI):     1.02 cm AV Vmax:           319.00 cm/s AV Vmean:          214.000 cm/s AV VTI:            0.692 m AV Peak Grad:      40.7 mmHg AV Mean Grad:      24.0 mmHg LVOT Vmax:         107.00 cm/s LVOT Vmean:        67.600 cm/s LVOT VTI:          0.225 m LVOT/AV VTI ratio: 0.33 AI PHT:            419 msec  AORTA Ao Root diam: 3.80 cm Ao Asc diam:  3.40 cm MITRAL VALVE                TRICUSPID VALVE MV Area (PHT): 2.20 cm     TR Peak grad:   22.7 mmHg MV Decel Time: 345 msec     TR Vmax:        238.00 cm/s MV E velocity: 87.90 cm/s MV A velocity: 136.00 cm/s  SHUNTS MV E/A ratio:  0.65         Systemic VTI:  0.22 m                             Systemic Diam:  2.00 cm Lennie OdorWesley O'Neal MD Electronically signed by Lennie OdorWesley O'Neal MD Signature Date/Time:  11/28/2019/5:28:43 PM    Final    Scheduled Meds: . amLODipine  5 mg Oral Daily  . aspirin EC  325 mg Oral Daily  . cholecalciferol  1,000 Units Oral Daily  . clopidogrel  75 mg Oral Daily  . divalproex  750 mg Oral Daily  . enoxaparin (LOVENOX) injection  40 mg Subcutaneous Q24H  . ezetimibe  10 mg Oral Daily  . famotidine  20 mg Oral Daily  . ferrous sulfate  325 mg Oral Q breakfast  . multivitamin  1 tablet Oral Daily  . sodium chloride flush  3 mL Intravenous Q12H  . vitamin B-12  1,000 mcg Oral Daily   Continuous Infusions: . sodium chloride 100 mL/hr at 11/29/19 2308     LOS: 3 days   Time spent: 35min  Azucena FallenWilliam C Dinora Hemm, DO [facility]  If 7PM-7AM, please contact night-coverage [email or web address]  11/30/2019, 7:15 AM

## 2019-12-01 LAB — BASIC METABOLIC PANEL
Anion gap: 8 (ref 5–15)
BUN: 18 mg/dL (ref 8–23)
CO2: 24 mmol/L (ref 22–32)
Calcium: 8.3 mg/dL — ABNORMAL LOW (ref 8.9–10.3)
Chloride: 111 mmol/L (ref 98–111)
Creatinine, Ser: 1.13 mg/dL (ref 0.61–1.24)
GFR calc Af Amer: >60 mL/min (ref >60)
GFR calc non Af Amer: 57 mL/min — ABNORMAL LOW (ref >60)
Glucose, Bld: 98 mg/dL (ref 70–99)
Potassium: 3.5 mmol/L (ref 3.5–5.1)
Sodium: 143 mmol/L (ref 135–145)

## 2019-12-01 LAB — CBC
HCT: 29 % — ABNORMAL LOW (ref 39.0–52.0)
Hemoglobin: 9.5 g/dL — ABNORMAL LOW (ref 13.0–17.0)
MCH: 31.8 pg (ref 26.0–34.0)
MCHC: 32.8 g/dL (ref 30.0–36.0)
MCV: 97 fL (ref 80.0–100.0)
Platelets: 166 10*3/uL (ref 150–400)
RBC: 2.99 MIL/uL — ABNORMAL LOW (ref 4.22–5.81)
RDW: 13 % (ref 11.5–15.5)
WBC: 7.1 10*3/uL (ref 4.0–10.5)
nRBC: 0 % (ref 0.0–0.2)

## 2019-12-01 LAB — GLUCOSE, CAPILLARY: Glucose-Capillary: 92 mg/dL (ref 70–99)

## 2019-12-01 NOTE — Progress Notes (Signed)
PROGRESS NOTE    Nicholas Gaines  YBF:[id number] DOB: [birth year] DOA: 11/27/2019 PCP: [name], MD   Brief Narrative:  Nicholas Gaines is a 86 y.o. male with medical history significant of stroke with minimal residual weakness-has loop recorder, aortic valve disease, hyperlipidemia, GERD, gout, complex partial seizure disorder, presents to ER due to fall that occurred probably between 5 PM last night and 10 AM this morning. Daughter is the historian who reports that patient was doing fine at 5 PM yesterday.  She went to check on him again this morning at 10 AM and found him on the kitchen floor, unknown downtime.  Patient does not remember falling, he is unsure how he fell. He denies headache, blurry vision, slurred speech, head trauma, lightheadedness, dizziness, facial droop, numbness weakness tingling sensation in hands or feet, chest pain, shortness of breath, palpitation, nausea, vomiting, diarrhea, fever, chills, cough, congestion, urinary or bowel changes. Daughter thinks that patient fall likely due to seizure.  He takes Depakote 750 mg at home and he missed last night dose.  This morning she noted that patient was confused and does not remember anything about the fall.  He has loop recorder which was placed about 3 years ago for the concern of palpitation and symptoms concerning for TIA.  His last interrogation was done on last Thursday which was negative. He lives alone at home.  Uses walker and cane for ambulation.  No history of smoking, alcohol, illicit drug use. Upon arrival to ED: Patient's vital signs stable.  Afebrile with no leukocytosis.  Troponin: 42, CK: 1747, ethanol, INR: WNL, chest x-ray and CT head without contrast came back negative for acute findings.  UA and COVID-19 pending.  Patient received IV fluid bolus in ED.  [facility] hospitalist consulted for admission for syncope.   Assessment & Plan:   Principal Problem:   Syncope Active Problems:   HLD (hyperlipidemia)  Normocytic anemia   Essential (primary) hypertension   Seizures (HCC)   Elevated CK  Acute metabolic encephalopathy, likely multifactorial with possible syncopal event, resolving Cannot rule out breakthrough seizure, POA: - Continue as inpatient given ambulatory dysfunction as below, unclear if acutely related to syncope, dehydration or poor p.o. intake. - Depakote level low, slightly improved from previous baseline, no change in recent antiepileptic therapy due to intolerance of medication changes per neuro outpatient note and discussion with daughter - Patient is other labs including lactic acid, UDS, TSH within normal limits - Echocardiogram completed, without overt dysfunction - Loop recorder interrogation is pending - PT/ST/OT consulted and following - Continue fall precautions, aspiration precautions, high risk for delirium given age and comorbid conditions - Continue home Depakote at previous dosing  Acute ambulatory dysfunction, elevated CK in the setting of fall, rule out rhabdomyolysis Lab Results  Component Value Date   CKTOTAL 1,747 (H) 11/27/2019  - Patient received IV fluids in the ED, continues to tolerate p.o. quite well at this point -discontinue IV fluids - Unclear if patient's ambulatory dysfunction is in the setting of above possible seizure with Todd's paralysis although this would more likely be unilateral rather than bilateral lower extremity weakness, patient's rhabdomyolysis may be causing patient's ambulatory dysfunction or there could be tertiary underlying cause yet discovered. - Continue to evaluate for possible infectious etiology however patient remains without fever, without leukocytosis.  No acute symptoms noted per the patient.  Minimally elevated troponin, likely in the setting of above  - Troponin flat at 42, 43, no chest pain, nausea, diaphoresis. - EKG  without overt ST elevation or depression - Continue to follow clinically  Normocytic anemia,  likely chronic anemia of chronic disease - Continue to monitor  History of stroke: - Has loop recorder which was placed on 09/16/2016 due to history of palpitation, symptoms concerning for TIA - Continue aspirin, Zetia, Plavix  Hypertension  -Poorly controlled -not currently on any home antihypertensive medications per chart review -Start amlodipine 5, follow along, will uptitrate as necessary  Hyperlipidemia: - Continue Zetia  GERD: Continue Pepcid  DVT prophylaxis: Lovenox/SCD/TED Code Status: DNR-confirmed with patient and his daughter at admission Family Communication:  Lengthy discussion with patient's daughter over the phone Disposition Plan:  SNF at this point given patient's ongoing ambulatory dysfunction, mental status baseline unsafe discharge as he lives alone at home.  PT rightly recommending SNF and patient's daughter is agreeable for this plan once bed can be obtained and approved. Consults called: None Admission status: Inpatient  Subjective: No acute issues or events overnight per staff, patient denies chest pain, shortness of breath, nausea, vomiting, diarrhea, constipation, headache, fevers, chills but review of systems seems somewhat limited appears to be his baseline.  Objective: Vitals:   11/30/19 2027 12/01/19 0001 12/01/19 0343 12/01/19 0420  BP: 129/67 123/63 (!) 179/90   Pulse: 69 60 (!) 56   Resp: 20 17 17    Temp: 98.2 F (36.8 C) 98.2 F (36.8 C) 97.8 F (36.6 C)   TempSrc: Oral Oral Oral   SpO2: 99% 99%    Weight:    68 kg  Height:        Intake/Output Summary (Last 24 hours) at 12/01/2019 0719 Last data filed at 11/30/2019 1700 Gross per 24 hour  Intake -  Output 1100 ml  Net -1100 ml   Filed Weights   11/28/19 1359 11/29/19 0425 12/01/19 0420  Weight: 63.6 kg 64.4 kg 68 kg    Examination:  General:  Pleasantly resting in bed, No acute distress.  Alert to person and place only HEENT:  Normocephalic atraumatic.  Sclerae nonicteric,  noninjected.  Extraocular movements intact bilaterally. Neck:  Without mass or deformity.  Trachea is midline. Lungs:  Clear to auscultate bilaterally without rhonchi, wheeze, or rales. Heart:  Regular rate and rhythm.  Without murmurs, rubs, or gallops. Abdomen:  Soft, nontender, nondistended.  Without guarding or rebound. Extremities: Without cyanosis, clubbing, edema, or obvious deformity. Vascular:  Dorsalis pedis and posterior tibial pulses palpable bilaterally. Skin:  Warm and dry, no erythema, no ulcerations.   Data Reviewed: I have personally reviewed following labs and imaging studies  CBC: Recent Labs  Lab 11/27/19 1444 11/28/19 0333 11/28/19 0335 11/29/19 0427 11/30/19 0357 12/01/19 0231  WBC 9.2  --  8.5 10.9* 7.3 7.1  HGB 10.4*  --  10.7* 10.8* 9.7* 9.5*  HCT 32.1* 32.1* 32.8* 32.2* 29.7* 29.0*  MCV 96.7  --  96.2 94.7 96.7 97.0  PLT 163  --  170 164 150 166   Basic Metabolic Panel: Recent Labs  Lab 11/27/19 1143 11/27/19 1143 11/27/19 1200 11/27/19 1200 11/27/19 1444 11/28/19 0335 11/29/19 0427 11/30/19 0357 12/01/19 0231  NA 139   < > 137  --   --  139 139 142 143  K 4.1   < > 3.8  --   --  3.7 3.4* 3.5 3.5  CL 100   < > 100  --   --  103 106 109 111  CO2 27  --   --   --   --  26 24 26 24   GLUCOSE 111*   < > 105*  --   --  81 105* 101* 98  BUN 15   < > 19  --   --  16 19 19 18   CREATININE 1.05   < > 0.90   < > 1.01 1.11 1.05 1.07 1.13  CALCIUM 9.2  --   --   --   --  8.5* 8.3* 8.2* 8.3*  MG  --   --   --   --  1.8  --   --   --   --   PHOS  --   --   --   --  3.4  --   --   --   --    < > = values in this interval not displayed.   GFR: Estimated Creatinine Clearance: 40 mL/min (by C-G formula based on SCr of 1.13 mg/dL). Liver Function Tests: Recent Labs  Lab 11/27/19 1143 11/28/19 0335  AST 48* 56*  ALT 18 18  ALKPHOS 84 77  BILITOT 0.8 0.7  PROT 6.8 5.7*  ALBUMIN 3.8 3.2*   No results for input(s): LIPASE, AMYLASE in the last 168  hours. Recent Labs  Lab 11/27/19 1455  AMMONIA <9*   Coagulation Profile: Recent Labs  Lab 11/27/19 1143  INR 1.1   Cardiac Enzymes: Recent Labs  Lab 11/27/19 1143  CKTOTAL 1,747*   BNP (last 3 results) No results for input(s): PROBNP in the last 8760 hours. HbA1C: No results for input(s): HGBA1C in the last 72 hours. CBG: Recent Labs  Lab 11/29/19 1629 11/29/19 2122 11/30/19 0608 11/30/19 2121 12/01/19 0612  GLUCAP 98 113* 92 98 92   Lipid Profile: No results for input(s): CHOL, HDL, LDLCALC, TRIG, CHOLHDL, LDLDIRECT in the last 72 hours. Thyroid Function Tests: No results for input(s): TSH, T4TOTAL, FREET4, T3FREE, THYROIDAB in the last 72 hours. Anemia Panel: No results for input(s): VITAMINB12, FOLATE, FERRITIN, TIBC, IRON, RETICCTPCT in the last 72 hours. Sepsis Labs: Recent Labs  Lab 11/27/19 1455 11/28/19 0333  LATICACIDVEN 1.1 1.1    Recent Results (from the past 240 hour(s))  SARS CORONAVIRUS 2 (TAT 6-24 HRS) Nasopharyngeal Nasopharyngeal Swab     Status: None   Collection Time: 11/27/19  1:15 PM   Specimen: Nasopharyngeal Swab  Result Value Ref Range Status   SARS Coronavirus 2 NEGATIVE NEGATIVE Final    Comment: (NOTE) SARS-CoV-2 target nucleic acids are NOT DETECTED. The SARS-CoV-2 RNA is generally detectable in upper and lower respiratory specimens during the acute phase of infection. Negative results do not preclude SARS-CoV-2 infection, do not rule out co-infections with other pathogens, and should not be used as the sole basis for treatment or other patient management decisions. Negative results must be combined with clinical observations, patient history, and epidemiological information. The expected result is Negative. Fact Sheet for Patients: 11/30/19 Fact Sheet for Healthcare Providers: 11/29/19 This test is not yet approved or cleared by the [place] FDA and   has been authorized for detection and/or diagnosis of SARS-CoV-2 by FDA under an Emergency Use Authorization (EUA). This EUA will remain  in effect (meaning this test can be used) for the duration of the COVID-19 declaration under Section 56 4(b)(1) of the Act, 21 U.S.C. section 360bbb-3(b)(1), unless the authorization is terminated or revoked sooner. Performed at [facility], [address], [place], [address] [place]          Radiology Studies: No results found.  Scheduled Meds: . amLODipine  5 mg Oral Daily  . aspirin EC  325 mg Oral Daily  . cholecalciferol  1,000 Units Oral Daily  . clopidogrel  75 mg Oral Daily  . divalproex  750 mg Oral Daily  . enoxaparin (LOVENOX) injection  40 mg Subcutaneous Q24H  . ezetimibe  10 mg Oral Daily  . famotidine  20 mg Oral Daily  . ferrous sulfate  325 mg Oral Q breakfast  . multivitamin  1 tablet Oral Daily  . sodium chloride flush  3 mL Intravenous Q12H  . vitamin B-12  1,000 mcg Oral Daily   Continuous Infusions: . sodium chloride 100 mL/hr at 12/01/19 0436     LOS: 4 days   Time spent: 50min  William C Lancaster, DO [facility]  If 7PM-7AM, please contact night-coverage [email or web address]  12/01/2019, 7:19 AM

## 2019-12-01 NOTE — Social Work (Addendum)
Insurance authorization received, reference [id number]. Good 4/23 thru 4/27. Erasmo Score point of contact for additional clinicals.   CSW made telephone call to [employer] to provide insurance authorization and confirm bed availability, left a voicemail. CSW will continue to follow.  Verlon Au, MSW, [employer]

## 2019-12-02 LAB — BASIC METABOLIC PANEL
Anion gap: 8 (ref 5–15)
BUN: 15 mg/dL (ref 8–23)
CO2: 28 mmol/L (ref 22–32)
Calcium: 8.6 mg/dL — ABNORMAL LOW (ref 8.9–10.3)
Chloride: 107 mmol/L (ref 98–111)
Creatinine, Ser: 1.14 mg/dL (ref 0.61–1.24)
GFR calc Af Amer: >60 mL/min (ref >60)
GFR calc non Af Amer: 57 mL/min — ABNORMAL LOW (ref >60)
Glucose, Bld: 100 mg/dL — ABNORMAL HIGH (ref 70–99)
Potassium: 3.7 mmol/L (ref 3.5–5.1)
Sodium: 143 mmol/L (ref 135–145)

## 2019-12-02 LAB — CBC
HCT: 30.3 % — ABNORMAL LOW (ref 39.0–52.0)
Hemoglobin: 9.9 g/dL — ABNORMAL LOW (ref 13.0–17.0)
MCH: 31.5 pg (ref 26.0–34.0)
MCHC: 32.7 g/dL (ref 30.0–36.0)
MCV: 96.5 fL (ref 80.0–100.0)
Platelets: 174 10*3/uL (ref 150–400)
RBC: 3.14 MIL/uL — ABNORMAL LOW (ref 4.22–5.81)
RDW: 12.7 % (ref 11.5–15.5)
WBC: 6.9 10*3/uL (ref 4.0–10.5)
nRBC: 0 % (ref 0.0–0.2)

## 2019-12-02 LAB — SARS CORONAVIRUS 2 (TAT 6-24 HRS): SARS Coronavirus 2: NEGATIVE

## 2019-12-02 MED ORDER — AMLODIPINE BESYLATE 10 MG PO TABS
10.0000 mg | ORAL_TABLET | Freq: Every day | ORAL | Status: DC
  Administered 2019-12-02 – 2019-12-03 (×2): 10 mg via ORAL
  Filled 2019-12-02 (×2): qty 1

## 2019-12-02 NOTE — Progress Notes (Signed)
PROGRESS NOTE    Nicholas Gaines  IRJ:[id number] DOB: May 27, 1929 DOA: 11/27/2019 PCP: Antony Contras, MD   Brief Narrative:  Nicholas Gaines is a 86 y.o. male with medical history significant of stroke with minimal residual weakness-has loop recorder, aortic valve disease, hyperlipidemia, GERD, gout, complex partial seizure disorder, presents to ER due to fall that occurred probably between 5 PM last night and 10 AM this morning. Daughter is the historian who reports that patient was doing fine at 5 PM yesterday.  She went to check on him again this morning at 10 AM and found him on the kitchen floor, unknown downtime.  Patient does not remember falling, he is unsure how he fell. He denies headache, blurry vision, slurred speech, head trauma, lightheadedness, dizziness, facial droop, numbness weakness tingling sensation in hands or feet, chest pain, shortness of breath, palpitation, nausea, vomiting, diarrhea, fever, chills, cough, congestion, urinary or bowel changes. Daughter thinks that patient fall likely due to seizure.  He takes Depakote 750 mg at home and he missed last night dose.  This morning she noted that patient was confused and does not remember anything about the fall.  He has loop recorder which was placed about 3 years ago for the concern of palpitation and symptoms concerning for TIA.  His last interrogation was done on last Thursday which was negative. He lives alone at home.  Uses walker and cane for ambulation.  No history of smoking, alcohol, illicit drug use. Upon arrival to ED: Patient's vital signs stable.  Afebrile with no leukocytosis.  Troponin: 42, CK: 1747, ethanol, INR: WNL, chest x-ray and CT head without contrast came back negative for acute findings.  UA and COVID-19 pending.  Patient received IV fluid bolus in ED.  [facility] hospitalist consulted for admission for syncope.   Assessment & Plan:   Principal Problem:   Syncope Active Problems:   HLD (hyperlipidemia)  Normocytic anemia   Essential (primary) hypertension   Seizures (HCC)   Elevated CK   Acute metabolic encephalopathy, likely multifactorial with possible syncopal event, resolving Cannot rule out breakthrough seizure, POA: - Continue as inpatient given ambulatory dysfunction as below, unclear if acutely related to syncope, dehydration or poor p.o. intake. - Depakote level low, slightly improved from previous baseline, no change in recent antiepileptic therapy due to intolerance of medication changes per neuro outpatient note and discussion with daughter - Patient is other labs including lactic acid, UDS, TSH within normal limits - Echocardiogram completed, without overt dysfunction - Loop recorder interrogation 11/22/2019 with 0 reported episodes of tachycardia pauses bradycardia with 113 false A. fib episodes - PT/ST/OT consulted and following - Continue fall precautions, aspiration precautions, high risk for delirium given age and comorbid conditions - Continue home Depakote at previous dosing  Acute ambulatory dysfunction, elevated CK in the setting of fall, rule out rhabdomyolysis, resolving Lab Results  Component Value Date   CKTOTAL 1,747 (H) 11/27/2019  -Transition off fluids now that patient is tolerating p.o. more adequately - Unclear if patient's ambulatory dysfunction is in the setting of above possible seizure with Todd's paralysis although this would more likely be unilateral rather than bilateral lower extremity weakness, patient's rhabdomyolysis may be causing patient's ambulatory dysfunction -needs to improve with PT - Continue to evaluate for possible infectious etiology however patient remains without fever, without leukocytosis.  No acute symptoms noted per the patient.  Minimally elevated troponin, likely in the setting of above  - Troponin flat at 42, 43, no chest pain, nausea, diaphoresis. -  EKG without overt ST elevation or depression - Continue to follow  clinically  Normocytic anemia, likely chronic anemia of chronic disease - Continue to monitor  History of stroke: - Has loop recorder which was placed on 09/16/2016 due to history of palpitation, symptoms concerning for TIA - Continue aspirin, Zetia, Plavix  Hypertension  -Poorly controlled -Increase amlodipine to 10mg , follow along, will uptitrate as necessary  Hyperlipidemia: - Continue Zetia  GERD: Continue Pepcid  DVT prophylaxis: Lovenox/SCD/TED Code Status: DNR-confirmed with patient and his daughter at admission Family Communication:  Lengthy discussion with patient's daughter over the phone, she indicates he appears like he is improving drastically over the past 24 hours Disposition Plan:  Pending SNF approval and bed availability given patient's ongoing ambulatory dysfunction and weakness, remains unsafe discharge Consults called: None Admission status: Inpatient, continues to require close evaluation and monitoring in the setting of poorly controlled hypertension, mental status changes and fall.  Subjective: No acute issues or events overnight per staff, patient denies chest pain, shortness of breath, nausea, vomiting, diarrhea, constipation, headache, fevers, chills but review of systems seems somewhat limited appears to be his baseline.  Objective: Vitals:   12/01/19 2009 12/02/19 0008 12/02/19 0351 12/02/19 0423  BP: (!) 148/80 (!) 157/84 (!) 169/77   Pulse: 77 62 (!) 59   Resp: 19 18 18    Temp: 98.4 F (36.9 C) 98 F (36.7 C) (!) 97.5 F (36.4 C)   TempSrc: Oral Oral Oral   SpO2: 100% 99% 99%   Weight:    68 kg  Height:        Intake/Output Summary (Last 24 hours) at 12/02/2019 Last data filed at 12/02/2019 0600 Gross per 24 hour  Intake 2916.62 ml  Output 1950 ml  Net 966.62 ml   Filed Weights   11/29/19 0425 12/01/19 0420 12/02/19 0423  Weight: 64.4 kg 68 kg 68 kg    Examination:  General:  Pleasantly resting in bed, No acute distress.   Alert to person and place only HEENT:  Normocephalic atraumatic.  Sclerae nonicteric, noninjected.  Extraocular movements intact bilaterally. Neck:  Without mass or deformity.  Trachea is midline. Lungs:  Clear to auscultate bilaterally without rhonchi, wheeze, or rales. Heart:  Regular rate and rhythm.  Without murmurs, rubs, or gallops. Abdomen:  Soft, nontender, nondistended.  Without guarding or rebound. Extremities: Without cyanosis, clubbing, edema, or obvious deformity. Vascular:  Dorsalis pedis and posterior tibial pulses palpable bilaterally. Skin:  Warm and dry, no erythema, no ulcerations.   Data Reviewed: I have personally reviewed following labs and imaging studies  CBC: Recent Labs  Lab 11/28/19 0335 11/29/19 0427 11/30/19 0357 12/01/19 0231 12/02/19 0229  WBC 8.5 10.9* 7.3 7.1 6.9  HGB 10.7* 10.8* 9.7* 9.5* 9.9*  HCT 32.8* 32.2* 29.7* 29.0* 30.3*  MCV 96.2 94.7 96.7 97.0 96.5  PLT 170 164 150 166 174   Basic Metabolic Panel: Recent Labs  Lab 11/27/19 1200 11/27/19 1444 11/28/19 0335 11/29/19 0427 11/30/19 0357 12/01/19 0231 12/02/19 0229  NA   < >  --  139 139 142 143 143  K   < >  --  3.7 3.4* 3.5 3.5 3.7  CL   < >  --  103 106 109 111 107  CO2   < >  --  26 24 26 24 28   GLUCOSE   < >  --  81 105* 101* 98 100*  BUN   < >  --  16 19 19  18  15  CREATININE   < > 1.01 1.11 1.05 1.07 1.13 1.14  CALCIUM   < >  --  8.5* 8.3* 8.2* 8.3* 8.6*  MG  --  1.8  --   --   --   --   --   PHOS  --  3.4  --   --   --   --   --    < > = values in this interval not displayed.   GFR: Estimated Creatinine Clearance: 39.6 mL/min (by C-G formula based on SCr of 1.14 mg/dL). Liver Function Tests: Recent Labs  Lab 11/27/19 1143 11/28/19 0335  AST 48* 56*  ALT 18 18  ALKPHOS 84 77  BILITOT 0.8 0.7  PROT 6.8 5.7*  ALBUMIN 3.8 3.2*   No results for input(s): LIPASE, AMYLASE in the last 168 hours. Recent Labs  Lab 11/27/19 1455  AMMONIA <9*   Coagulation  Profile: Recent Labs  Lab 11/27/19 1143  INR 1.1   Cardiac Enzymes: Recent Labs  Lab 11/27/19 1143  CKTOTAL 1,747*   BNP (last 3 results) No results for input(s): PROBNP in the last 8760 hours. HbA1C: No results for input(s): HGBA1C in the last 72 hours. CBG: Recent Labs  Lab 11/29/19 1629 11/29/19 2122 11/30/19 0608 11/30/19 2121 12/01/19 0612  GLUCAP 98 113* 92 98 92   Lipid Profile: No results for input(s): CHOL, HDL, LDLCALC, TRIG, CHOLHDL, LDLDIRECT in the last 72 hours. Thyroid Function Tests: No results for input(s): TSH, T4TOTAL, FREET4, T3FREE, THYROIDAB in the last 72 hours. Anemia Panel: No results for input(s): VITAMINB12, FOLATE, FERRITIN, TIBC, IRON, RETICCTPCT in the last 72 hours. Sepsis Labs: Recent Labs  Lab 11/27/19 1455 11/28/19 0333  LATICACIDVEN 1.1 1.1    Recent Results (from the past 240 hour(s))  SARS CORONAVIRUS 2 (TAT 6-24 HRS) Nasopharyngeal Nasopharyngeal Swab     Status: None   Collection Time: 11/27/19  1:15 PM   Specimen: Nasopharyngeal Swab  Result Value Ref Range Status   SARS Coronavirus 2 NEGATIVE NEGATIVE Final    Comment: (NOTE) SARS-CoV-2 target nucleic acids are NOT DETECTED. The SARS-CoV-2 RNA is generally detectable in upper and lower respiratory specimens during the acute phase of infection. Negative results do not preclude SARS-CoV-2 infection, do not rule out co-infections with other pathogens, and should not be used as the sole basis for treatment or other patient management decisions. Negative results must be combined with clinical observations, patient history, and epidemiological information. The expected result is Negative. Fact Sheet for Patients: [employer] Fact Sheet for Healthcare Providers: quierodirigir.com This test is not yet approved or cleared by the [place] FDA and  has been authorized for detection and/or diagnosis of SARS-CoV-2  by FDA under an Emergency Use Authorization (EUA). This EUA will remain  in effect (meaning this test can be used) for the duration of the COVID-19 declaration under Section 56 4(b)(1) of the Act, 21 U.S.C. section 360bbb-3(b)(1), unless the authorization is terminated or revoked sooner. Performed at [facility], [address], [place], [place] [address]          Radiology Studies: No results found. Scheduled Meds: . amLODipine  10 mg Oral Daily  . aspirin EC  325 mg Oral Daily  . cholecalciferol  1,000 Units Oral Daily  . clopidogrel  75 mg Oral Daily  . divalproex  750 mg Oral Daily  . enoxaparin (LOVENOX) injection  40 mg Subcutaneous Q24H  . ezetimibe  10 mg Oral Daily  .  famotidine  20 mg Oral Daily  . ferrous sulfate  325 mg Oral Q breakfast  . multivitamin  1 tablet Oral Daily  . sodium chloride flush  3 mL Intravenous Q12H  . vitamin B-12  1,000 mcg Oral Daily   Continuous Infusions:    LOS: 5 days   Time spent:  Azucena Fallen, DO [facility]  If 7PM-7AM, please contact night-coverage [email or web address]  12/02/2019, 7:12 AM

## 2019-12-03 DIAGNOSIS — G9340 Encephalopathy, unspecified: Secondary | ICD-10-CM | POA: Diagnosis not present

## 2019-12-03 DIAGNOSIS — D649 Anemia, unspecified: Secondary | ICD-10-CM

## 2019-12-03 DIAGNOSIS — J069 Acute upper respiratory infection, unspecified: Secondary | ICD-10-CM | POA: Diagnosis not present

## 2019-12-03 DIAGNOSIS — R41841 Cognitive communication deficit: Secondary | ICD-10-CM | POA: Diagnosis not present

## 2019-12-03 DIAGNOSIS — I69351 Hemiplegia and hemiparesis following cerebral infarction affecting right dominant side: Secondary | ICD-10-CM | POA: Diagnosis not present

## 2019-12-03 DIAGNOSIS — I359 Nonrheumatic aortic valve disorder, unspecified: Secondary | ICD-10-CM | POA: Diagnosis not present

## 2019-12-03 DIAGNOSIS — Z8673 Personal history of transient ischemic attack (TIA), and cerebral infarction without residual deficits: Secondary | ICD-10-CM | POA: Diagnosis not present

## 2019-12-03 DIAGNOSIS — G40219 Localization-related (focal) (partial) symptomatic epilepsy and epileptic syndromes with complex partial seizures, intractable, without status epilepticus: Secondary | ICD-10-CM | POA: Diagnosis not present

## 2019-12-03 DIAGNOSIS — G40209 Localization-related (focal) (partial) symptomatic epilepsy and epileptic syndromes with complex partial seizures, not intractable, without status epilepticus: Secondary | ICD-10-CM | POA: Diagnosis not present

## 2019-12-03 DIAGNOSIS — R2681 Unsteadiness on feet: Secondary | ICD-10-CM | POA: Diagnosis not present

## 2019-12-03 DIAGNOSIS — E785 Hyperlipidemia, unspecified: Secondary | ICD-10-CM | POA: Diagnosis not present

## 2019-12-03 DIAGNOSIS — I10 Essential (primary) hypertension: Secondary | ICD-10-CM | POA: Diagnosis not present

## 2019-12-03 DIAGNOSIS — M109 Gout, unspecified: Secondary | ICD-10-CM | POA: Diagnosis not present

## 2019-12-03 DIAGNOSIS — I69828 Other speech and language deficits following other cerebrovascular disease: Secondary | ICD-10-CM | POA: Diagnosis not present

## 2019-12-03 DIAGNOSIS — R748 Abnormal levels of other serum enzymes: Secondary | ICD-10-CM | POA: Diagnosis not present

## 2019-12-03 DIAGNOSIS — R569 Unspecified convulsions: Secondary | ICD-10-CM | POA: Diagnosis not present

## 2019-12-03 DIAGNOSIS — I4891 Unspecified atrial fibrillation: Secondary | ICD-10-CM | POA: Diagnosis not present

## 2019-12-03 DIAGNOSIS — M6281 Muscle weakness (generalized): Secondary | ICD-10-CM | POA: Diagnosis not present

## 2019-12-03 DIAGNOSIS — R55 Syncope and collapse: Secondary | ICD-10-CM | POA: Diagnosis not present

## 2019-12-03 DIAGNOSIS — R002 Palpitations: Secondary | ICD-10-CM | POA: Diagnosis not present

## 2019-12-03 DIAGNOSIS — I69328 Other speech and language deficits following cerebral infarction: Secondary | ICD-10-CM | POA: Diagnosis not present

## 2019-12-03 DIAGNOSIS — M2550 Pain in unspecified joint: Secondary | ICD-10-CM | POA: Diagnosis not present

## 2019-12-03 DIAGNOSIS — K219 Gastro-esophageal reflux disease without esophagitis: Secondary | ICD-10-CM | POA: Diagnosis not present

## 2019-12-03 DIAGNOSIS — M1990 Unspecified osteoarthritis, unspecified site: Secondary | ICD-10-CM | POA: Diagnosis not present

## 2019-12-03 DIAGNOSIS — Z7401 Bed confinement status: Secondary | ICD-10-CM | POA: Diagnosis not present

## 2019-12-03 LAB — CBC
HCT: 30.4 % — ABNORMAL LOW (ref 39.0–52.0)
Hemoglobin: 10 g/dL — ABNORMAL LOW (ref 13.0–17.0)
MCH: 31.7 pg (ref 26.0–34.0)
MCHC: 32.9 g/dL (ref 30.0–36.0)
MCV: 96.5 fL (ref 80.0–100.0)
Platelets: 196 10*3/uL (ref 150–400)
RBC: 3.15 MIL/uL — ABNORMAL LOW (ref 4.22–5.81)
RDW: 12.8 % (ref 11.5–15.5)
WBC: 6.4 10*3/uL (ref 4.0–10.5)
nRBC: 0 % (ref 0.0–0.2)

## 2019-12-03 LAB — BASIC METABOLIC PANEL
Anion gap: 8 (ref 5–15)
BUN: 17 mg/dL (ref 8–23)
CO2: 28 mmol/L (ref 22–32)
Calcium: 8.7 mg/dL — ABNORMAL LOW (ref 8.9–10.3)
Chloride: 106 mmol/L (ref 98–111)
Creatinine, Ser: 1.18 mg/dL (ref 0.61–1.24)
GFR calc Af Amer: >60 mL/min (ref >60)
GFR calc non Af Amer: 54 mL/min — ABNORMAL LOW (ref >60)
Glucose, Bld: 100 mg/dL — ABNORMAL HIGH (ref 70–99)
Potassium: 3.6 mmol/L (ref 3.5–5.1)
Sodium: 142 mmol/L (ref 135–145)

## 2019-12-03 LAB — GLUCOSE, CAPILLARY: Glucose-Capillary: 85 mg/dL (ref 70–99)

## 2019-12-03 MED ORDER — AMLODIPINE BESYLATE 10 MG PO TABS: 10.0000 mg | ORAL_TABLET | Freq: Every day | ORAL | 0 refills | Status: DC

## 2019-12-03 NOTE — Progress Notes (Signed)
Patient being discharged to SNF, report given to receiving RN. Belongings returned to patient- glasses, clothing, cell phone

## 2019-12-03 NOTE — TOC Transition Note (Signed)
Transition of Care [facility]) - CM/SW Discharge Note   Patient Details  Name: Merton Wadlow MRN: [id number] Date of Birth: Jul 11, 1929  [facility]) CM/SW Contact:  Baldemar Lenis, LCSW Phone Number: 12/03/2019, 12:57 PM   Clinical Narrative:   Nurse to call report to [phone], Room 504    Final next level of care: Skilled Nursing Facility Barriers to Discharge: Barriers Resolved   Patient Goals and CMS Choice   CMS Medicare.gov Compare Post Acute Care list provided to:: Patient Represenative (must comment) Choice offered to / list presented to : Adult Children(daughter: [name])  Discharge Placement              Patient chooses bed at: [facility] Patient to be transferred to facility by: [facility] Name of family member notified: [name] Patient and family notified of of transfer: 12/03/19  Discharge Plan and Services In-house Referral: Clinical Social Work Discharge Planning Services: [employer] Consult Post Acute Care Choice: Skilled Nursing Facility                               Social Determinants of Health (SDOH) Interventions     Readmission Risk Interventions No flowsheet data found.

## 2019-12-03 NOTE — Discharge Summary (Signed)
Physician Discharge Summary  Nicholas Gaines OFB:[id number] DOB: May 09, 1929 DOA: 11/27/2019  PCP: [name], MD  Admit date: 11/27/2019 Discharge date: 12/03/2019  Admitted From: Home Disposition: SNF  Recommendations for Outpatient Follow-up:  1. Follow up with PCP in 1-2 weeks 2. Please obtain BMP/CBC in one week  Discharge Condition: Stable CODE STATUS: DNR Diet recommendation: As tolerated  Brief/Interim Summary: Nicholas Busman Thompsonis a 86 y.o.malewith medical history significant ofstroke with minimal residual weakness-has loop recorder, aortic valve disease, hyperlipidemia, GERD, gout, complex partial seizure disorder,presents to ER due to fall that occurred probably between 5 PM last night and 10 AM this morning. Daughter is the historian who reports that patient was doing fine at 5 PM yesterday. She went to check on him again this morning at 10 AM and found him on the kitchen floor, unknown downtime. Patient does not remember falling, he is unsure how he fell. He denies headache, blurry vision, slurred speech, head trauma, lightheadedness, dizziness, facial droop, numbness weakness tingling sensation in hands or feet, chest pain, shortness of breath, palpitation, nausea, vomiting, diarrhea, fever, chills, cough, congestion, urinary or bowel changes. Daughter thinks that patient fall likely due to seizure. He takes Depakote 750 mg at home and he missed last night dose. This morning she noted that patientwas confused and does not remember anything about the fall. He has loop recorder which was placed about 3 years ago for the concern of palpitation and symptoms concerning for TIA. His last interrogation was done on last Thursday which was negative. He lives alone at home. Uses walker and cane for ambulation. No history of smoking, alcohol, illicit drug use. Upon arrival to ED: Patient's vital signs stable. Afebrile with no leukocytosis. Troponin: 42, CK: 1747, ethanol, INR: WNL,  chest x-ray and CT head without contrast came back negative for acute findings. UA and COVID-19 pending. Patient received IV fluid bolus in ED. [facility] hospitalist consulted for admission for syncope.  Patient admitted with acute metabolic encephalopathy on chronic baseline dementia with possible syncopal event per report at admission. Does have history of seizures but no seizure-like activity was noted, patient did not have incontinence but did have moderately elevated CK concerning for rhabdomyolysis.  Patient had moderate ambulatory dysfunction requiring max assist initially improving slightly over his hospital course but still requiring physical therapy assistance with ambulation and remains an unsafe discharge given he lives alone at home a little support and continues to require physical therapy per PT evaluation.  Patient's mental status improved drastically with IV fluids and supportive care.  Showed no obvious episodes of dysrhythmia.  His other labs remains remarkably stable, without leukocytosis fever or concern for infection.  Patient had moderately uncontrolled blood pressure while in the hospital as such amlodipine was added to his home regimen for proper blood pressure control which she will continue at discharge.  Patient otherwise stable and agreeable for discharge to SNF for ongoing physical therapy and treatment.  Discharge Diagnoses:  Principal Problem:   Syncope Active Problems:   HLD (hyperlipidemia)   Normocytic anemia   Essential (primary) hypertension   Seizures (HCC)   Elevated CK    Discharge Instructions  Discharge Instructions    Call MD for:  difficulty breathing, headache or visual disturbances   Complete by: As directed    Call MD for:  extreme fatigue   Complete by: As directed    Call MD for:  hives   Complete by: As directed    Call MD for:  persistant dizziness or  light-headedness   Complete by: As directed    Call MD for:  persistant nausea and  vomiting   Complete by: As directed    Call MD for:  severe uncontrolled pain   Complete by: As directed    Call MD for:  temperature >100.4   Complete by: As directed    Diet - low sodium heart healthy   Complete by: As directed    Increase activity slowly   Complete by: As directed      Allergies as of 12/03/2019      Reactions   Rocephin [ceftriaxone Sodium In Dextrose] Other (See Comments)   C-Diff   Atorvastatin    Other reaction(s): myalgia   Pravastatin    Other reaction(s): myalgia   Rosuvastatin Calcium    Other reaction(s): fatigued      Medication List    STOP taking these medications   ibuprofen 200 MG tablet Commonly known as: ADVIL     TAKE these medications   acetaminophen 500 MG tablet Commonly known as: TYLENOL Take 650 mg by mouth 2 (two) times daily.   amLODipine 10 MG tablet Commonly known as: NORVASC Take 1 tablet (10 mg total) by mouth daily.   aspirin EC 325 MG tablet Take 325 mg by mouth daily.   Cholecalciferol 25 MCG (1000 UT) capsule Take 1,000 Units by mouth daily.   clopidogrel 75 MG tablet Commonly known as: PLAVIX Take 75 mg by mouth daily.   divalproex 500 MG 24 hr tablet Commonly known as: DEPAKOTE ER TAKE 1 TABLET BY MOUTH EVERY DAY WITH  TABLET FOR TOTAL OF  What changed:   how much to take  how to take this  when to take this   divalproex 250 MG 24 hr tablet Commonly known as: DEPAKOTE ER TAKE 1 TABLET BY MOUTH EVERY DAY WITH  TABLET FOR A TOTAL OF  What changed:   how much to take  how to take this  when to take this   famotidine 20 MG tablet Commonly known as: PEPCID Take 20 mg by mouth daily.   ferrous sulfate 325 (65 FE) MG EC tablet Take 325 mg by mouth daily with breakfast.   Fish Oil 1000 MG Caps Take 2,000 mg by mouth daily.   ICaps Caps Take 1 capsule by mouth daily.   LYCOPENE PO Take 10 mg by mouth daily.   Saw Palmetto 450 MG Caps Take 900 mg by mouth daily.    vitamin B-12 1000 MCG tablet Commonly known as: CYANOCOBALAMIN Take 1,000 mcg by mouth daily.   Zetia 10 MG tablet Generic drug: ezetimibe Take 10 mg by mouth daily.      Contact information for after-discharge care    Destination    [facility] Preferred SNF .   Service: Skilled Nursing Contact information: [address] [place] [address] [phone]             Allergies  Allergen Reactions  . Rocephin [Ceftriaxone Sodium In Dextrose] Other (See Comments)    C-Diff  . Atorvastatin     Other reaction(s): myalgia  . Pravastatin     Other reaction(s): myalgia  . Rosuvastatin Calcium     Other reaction(s): fatigued    Procedures/Studies: CT Head Wo Contrast  Result Date: 11/27/2019 CLINICAL DATA:  Unwitnessed fall EXAM: CT HEAD WITHOUT CONTRAST TECHNIQUE: Contiguous axial images were obtained from the base of the skull through the vertex without intravenous contrast. COMPARISON:  08/08/2019 FINDINGS:  Brain: No evidence of acute infarction, hemorrhage, hydrocephalus, extra-axial collection or mass lesion/mass effect. Extensive low-density changes within the periventricular and subcortical white matter compatible with chronic microvascular ischemic change. Moderate diffuse cerebral volume loss. Vascular: Atherosclerotic calcifications involving the large vessels of the skull base. No unexpected hyperdense vessel. Skull: Normal. Negative for fracture or focal lesion. Sinuses/Orbits: No acute finding. Other: None. IMPRESSION: 1.  No acute intracranial findings. 2.  Chronic microvascular ischemic change and cerebral volume loss. Electronically Signed   By: Duanne Guess D.O.   On: 11/27/2019 13:06   DG Chest Portable 1 View  Result Date: 11/27/2019 CLINICAL DATA:  Larey Seat yesterday. Denies chest pain or shortness of breath. EXAM: PORTABLE CHEST 1 VIEW COMPARISON:  Chest x-ray dated August 08, 2019. FINDINGS: The heart size and mediastinal  contours are within normal limits. Loop recorder again noted. Normal pulmonary vascularity. Minimal bibasilar atelectasis with unchanged scarring at the left lung base. No focal consolidation, pleural effusion, or pneumothorax. No acute osseous abnormality. Unchanged large hiatal hernia. IMPRESSION: No active disease. Electronically Signed   By: Obie Dredge M.D.   On: 11/27/2019 12:19   ECHOCARDIOGRAM COMPLETE  Result Date: 11/28/2019    ECHOCARDIOGRAM REPORT   Patient Name:   ROBLEY MATASSA Date of Exam: 11/28/2019 Medical Rec #:  [id number]      Height:       65.0 in Accession #:    [id number]     Weight:       145.0 lb Date of Birth:  [birth year]       BSA:          1.725 m Patient Age:    89 years       BP:           159/90 mmHg Patient Gender: M              HR:           79 bpm. Exam Location:  Inpatient Procedure: 2D Echo, Cardiac Doppler and Color Doppler Indications:    R55 Syncope  History:        Patient has no prior history of Echocardiogram examinations.                 Stroke, Arrythmias:Atrial Fibrillation, Signs/Symptoms:Murmur;                 Risk Factors:Dyslipidemia and GERD. Seizures.  Sonographer:    Elmarie Shiley Dance Referring Phys: [id number] Kasandra Knudsen PAHWANI IMPRESSIONS  1. Left ventricular ejection fraction, by estimation, is 60 to 65%. The left ventricle has normal function. The left ventricle has no regional wall motion abnormalities. Left ventricular diastolic function could not be evaluated.  2. Right ventricular systolic function is normal. The right ventricular size is normal. There is normal pulmonary artery systolic pressure. The estimated right ventricular systolic pressure is 25.7 mmHg.  3. Left atrial size was moderately dilated.  4. The mitral valve is degenerative. Mild mitral valve regurgitation. No evidence of mitral stenosis.  5. The aortic valve is tricuspid. Aortic valve regurgitation is trivial. Moderate aortic valve stenosis. Aortic valve area, by VTI measures 1.02 cm.  Aortic valve mean gradient measures 24.0 mmHg. Aortic valve Vmax measures 3.19 m/s.  6. The inferior vena cava is normal in size with greater than 50% respiratory variability, suggesting right atrial pressure of 3 mmHg. FINDINGS  Left Ventricle: Left ventricular ejection fraction, by estimation, is 60 to 65%. The left ventricle has normal function. The left ventricle has no  regional wall motion abnormalities. The left ventricular internal cavity size was normal in size. There is  no left ventricular hypertrophy. Left ventricular diastolic function could not be evaluated due to mitral annular calcification (moderate or greater). Left ventricular diastolic function could not be evaluated. Right Ventricle: The right ventricular size is normal. No increase in right ventricular wall thickness. Right ventricular systolic function is normal. There is normal pulmonary artery systolic pressure. The tricuspid regurgitant velocity is 2.38 m/s, and  with an assumed right atrial pressure of 3 mmHg, the estimated right ventricular systolic pressure is 44.0 mmHg. Left Atrium: Left atrial size was moderately dilated. Right Atrium: Right atrial size was normal in size. Pericardium: Trivial pericardial effusion is present. Presence of pericardial fat pad. Mitral Valve: The mitral valve is degenerative in appearance. Moderate to severe mitral annular calcification. Mild mitral valve regurgitation. No evidence of mitral valve stenosis. Tricuspid Valve: The tricuspid valve is grossly normal. Tricuspid valve regurgitation is mild . No evidence of tricuspid stenosis. Aortic Valve: The aortic valve is tricuspid. . There is severe thickening and severe calcifcation of the aortic valve. Aortic valve regurgitation is trivial. Aortic regurgitation PHT measures 419 msec. Moderate aortic stenosis is present. There is severe  thickening of the aortic valve. There is severe calcifcation of the aortic valve. Aortic valve mean gradient measures 24.0  mmHg. Aortic valve peak gradient measures 40.7 mmHg. Aortic valve area, by VTI measures 1.02 cm. Pulmonic Valve: The pulmonic valve was grossly normal. Pulmonic valve regurgitation is trivial. No evidence of pulmonic stenosis. Aorta: The aortic root and ascending aorta are structurally normal, with no evidence of dilitation. Venous: The inferior vena cava is normal in size with greater than 50% respiratory variability, suggesting right atrial pressure of 3 mmHg. IAS/Shunts: The atrial septum is grossly normal.  LEFT VENTRICLE PLAX 2D LVIDd:         4.30 cm LVIDs:         3.20 cm LV PW:         1.20 cm LV IVS:        0.90 cm LVOT diam:     2.00 cm LV SV:         71 LV SV Index:   41 LVOT Area:     3.14 cm  RIGHT VENTRICLE             IVC RV Basal diam:  3.10 cm     IVC diam: 1.50 cm RV Mid diam:    2.40 cm RV S prime:     18.20 cm/s TAPSE (M-mode): 2.7 cm LEFT ATRIUM              Index       RIGHT ATRIUM           Index LA diam:        2.50 cm  1.45 cm/m  RA Area:     14.30 cm LA Vol (A2C):   48.9 ml  28.34 ml/m RA Volume:   31.90 ml  18.49 ml/m LA Vol (A4C):   102.0 ml 59.12 ml/m LA Biplane Vol: 73.4 ml  42.54 ml/m  AORTIC VALVE AV Area (Vmax):    1.05 cm AV Area (Vmean):   0.99 cm AV Area (VTI):     1.02 cm AV Vmax:           319.00 cm/s AV Vmean:          214.000 cm/s AV VTI:  0.692 m AV Peak Grad:      40.7 mmHg AV Mean Grad:      24.0 mmHg LVOT Vmax:         107.00 cm/s LVOT Vmean:        67.600 cm/s LVOT VTI:          0.225 m LVOT/AV VTI ratio: 0.33 AI PHT:            419 msec  AORTA Ao Root diam: 3.80 cm Ao Asc diam:  3.40 cm MITRAL VALVE                TRICUSPID VALVE MV Area (PHT): 2.20 cm     TR Peak grad:   22.7 mmHg MV Decel Time: 345 msec     TR Vmax:        238.00 cm/s MV E velocity: 87.90 cm/s MV A velocity: 136.00 cm/s  SHUNTS MV E/A ratio:  0.65         Systemic VTI:  0.22 m                             Systemic Diam: 2.00 cm Lennie OdorWesley O'Neal MD Electronically signed by Lennie OdorWesley  O'Neal MD Signature Date/Time: 11/28/2019/5:28:43 PM    Final    CUP PACEART INCLINIC DEVICE CHECK  Result Date: 11/26/2019 Loop check in clinic. Battery status: good. R-waves 0.34 mV. 0 symptom episodes, 0  tachy episodes, 0  pause episodes, 0 brady episodes.  113 AF episodes that are false  SR with PVCs, AF detection programmed to least sensative . Monthly summary reports and ROV with Dr Johney FrameAllred prn.  CUP PACEART REMOTE DEVICE CHECK  Result Date: 11/08/2019 [facility] summary report received. Battery status OK. Normal device function. No new symptom episodes, tachy episodes, brady, or pause episodes. 59 new AF episodes that all appear to be SR with PACs. Has been sent to triage in the past for reprogramming.  Monthly summary reports and ROV/PRN Hassell HalimMary Jo Lemke, RN, CCDS, [employer]  CUP PACEART REMOTE DEVICE CHECK  Result Date: 11/08/2019 [facility]link summary report received. Battery status OK. Normal device function. No new symptom episodes, tachy episodes, brady, or pause episodes. Seven new AF episodes, all appear to be SR with PACs. Monthly summary reports and ROV/PRN Hassell HalimMary Jo Lemke, RN, CCDS, [employer]   Subjective: No acute issues or events overnight, patient tolerating p.o. quite well at bedside, continues to deny chest pain, shortness of breath, nausea, vomiting, diarrhea, constipation, headache, fevers, chills.  Discharge Exam: Vitals:   12/03/19 0418 12/03/19 0859  BP: (!) 150/88 (!) 150/96  Pulse: 61 (!) 101  Resp: 19 18  Temp: (!) 97.4 F (36.3 C) (!) 97.4 F (36.3 C)  SpO2: 100% (!) 84%   Vitals:   12/02/19 1953 12/02/19 2338 12/03/19 0418 12/03/19 0859  BP: 136/74 125/63 (!) 150/88 (!) 150/96  Pulse: 90 83 61 (!) 101  Resp: 18 18 19 18   Temp: 98.2 F (36.8 C) 98 F (36.7 C) (!) 97.4 F (36.3 C) (!) 97.4 F (36.3 C)  TempSrc: Oral Oral Oral Oral  SpO2: 100% 98% 100% (!) 84%  Weight:      Height:        General:  Pleasantly resting in bed, No acute  distress.  Alert to person and general situation only. HEENT:  Normocephalic atraumatic.  Sclerae nonicteric, noninjected.  Extraocular movements intact bilaterally. Neck:  Without mass or deformity.  Trachea is midline. Lungs:  Clear to auscultate bilaterally without rhonchi, wheeze, or rales. Heart:  Regular rate and rhythm.  Without murmurs, rubs, or gallops. Abdomen:  Soft, nontender, nondistended.  Without guarding or rebound. Extremities: Without cyanosis, clubbing, edema, or obvious deformity. Vascular:  Dorsalis pedis and posterior tibial pulses palpable bilaterally. Skin:  Warm and dry, no erythema, no ulcerations.   The results of significant diagnostics from this hospitalization (including imaging, microbiology, ancillary and laboratory) are listed below for reference.     Microbiology: Recent Results (from the past 240 hour(s))  SARS CORONAVIRUS 2 (TAT 6-24 HRS) Nasopharyngeal Nasopharyngeal Swab     Status: None   Collection Time: 11/27/19  1:15 PM   Specimen: Nasopharyngeal Swab  Result Value Ref Range Status   SARS Coronavirus 2 NEGATIVE NEGATIVE Final    Comment: (NOTE) SARS-CoV-2 target nucleic acids are NOT DETECTED. The SARS-CoV-2 RNA is generally detectable in upper and lower respiratory specimens during the acute phase of infection. Negative results do not preclude SARS-CoV-2 infection, do not rule out co-infections with other pathogens, and should not be used as the sole basis for treatment or other patient management decisions. Negative results must be combined with clinical observations, patient history, and epidemiological information. The expected result is Negative. Fact Sheet for Patients: HairSlick.no Fact Sheet for Healthcare Providers: quierodirigir.com This test is not yet approved or cleared by the [place] FDA and  has been authorized for detection and/or diagnosis of SARS-CoV-2 by FDA  under an Emergency Use Authorization (EUA). This EUA will remain  in effect (meaning this test can be used) for the duration of the COVID-19 declaration under Section 56 4(b)(1) of the Act, 21 U.S.C. section 360bbb-3(b)(1), unless the authorization is terminated or revoked sooner. Performed at [facility], [address] [place], [place] [address]   SARS CORONAVIRUS 2 (TAT 6-24 HRS) Nasopharyngeal Nasopharyngeal Swab     Status: None   Collection Time: 12/02/19  4:11 PM   Specimen: Nasopharyngeal Swab  Result Value Ref Range Status   SARS Coronavirus 2 NEGATIVE NEGATIVE Final    Comment: (NOTE) SARS-CoV-2 target nucleic acids are NOT DETECTED. The SARS-CoV-2 RNA is generally detectable in upper and lower respiratory specimens during the acute phase of infection. Negative results do not preclude SARS-CoV-2 infection, do not rule out co-infections with other pathogens, and should not be used as the sole basis for treatment or other patient management decisions. Negative results must be combined with clinical observations, patient history, and epidemiological information. The expected result is Negative. Fact Sheet for Patients: HairSlick.no Fact Sheet for Healthcare Providers: quierodirigir.com This test is not yet approved or cleared by the [place] FDA and  has been authorized for detection and/or diagnosis of SARS-CoV-2 by FDA under an Emergency Use Authorization (EUA). This EUA will remain  in effect (meaning this test can be used) for the duration of the COVID-19 declaration under Section 56 4(b)(1) of the Act, 21 U.S.C. section 360bbb-3(b)(1), unless the authorization is terminated or revoked sooner. Performed at [facility], [address] [place], [place] [address]      Labs: BNP (last 3 results) No results for input(s): BNP in the last 8760 hours. Basic Metabolic Panel: Recent Labs  Lab  11/27/19 1444 11/28/19 0335 11/29/19 0427 11/30/19 0357 12/01/19 0231 12/02/19 0229 12/03/19 0429  NA  --    < > 139 142 143 143 142  K  --    < > 3.4* 3.5 3.5  3.7 3.6  CL  --    < > 106 109 111 107 106  CO2  --    < > 24 26 24 28 28   GLUCOSE  --    < > 105* 101* 98 100* 100*  BUN  --    < > 19 19 18 15 17   CREATININE 1.01   < > 1.05 1.07 1.13 1.14 1.18  CALCIUM  --    < > 8.3* 8.2* 8.3* 8.6* 8.7*  MG 1.8  --   --   --   --   --   --   PHOS 3.4  --   --   --   --   --   --    < > = values in this interval not displayed.   Liver Function Tests: Recent Labs  Lab 11/27/19 1143 11/28/19 0335  AST 48* 56*  ALT 18 18  ALKPHOS 84 77  BILITOT 0.8 0.7  PROT 6.8 5.7*  ALBUMIN 3.8 3.2*   No results for input(s): LIPASE, AMYLASE in the last 168 hours. Recent Labs  Lab 11/27/19 1455  AMMONIA <9*   CBC: Recent Labs  Lab 11/29/19 0427 11/30/19 0357 12/01/19 0231 12/02/19 0229 12/03/19 0429  WBC 10.9* 7.3 7.1 6.9 6.4  HGB 10.8* 9.7* 9.5* 9.9* 10.0*  HCT 32.2* 29.7* 29.0* 30.3* 30.4*  MCV 94.7 96.7 97.0 96.5 96.5  PLT 164 150 166 174 196   Cardiac Enzymes: Recent Labs  Lab 11/27/19 1143  CKTOTAL 1,747*   BNP: Invalid input(s): POCBNP CBG: Recent Labs  Lab 11/29/19 2122 11/30/19 0608 11/30/19 2121 12/01/19 0612 12/03/19 0528  GLUCAP 113* 92 98 92 85   D-Dimer No results for input(s): DDIMER in the last 72 hours. Hgb A1c No results for input(s): HGBA1C in the last 72 hours. Lipid Profile No results for input(s): CHOL, HDL, LDLCALC, TRIG, CHOLHDL, LDLDIRECT in the last 72 hours. Thyroid function studies No results for input(s): TSH, T4TOTAL, T3FREE, THYROIDAB in the last 72 hours.  Invalid input(s): FREET3 Anemia work up No results for input(s): VITAMINB12, FOLATE, FERRITIN, TIBC, IRON, RETICCTPCT in the last 72 hours. Urinalysis    Component Value Date/Time   COLORURINE YELLOW 11/27/2019 1612   APPEARANCEUR CLEAR 11/27/2019 1612   LABSPEC 1.016  11/27/2019 1612   PHURINE 6.0 11/27/2019 1612   GLUCOSEU NEGATIVE 11/27/2019 1612   HGBUR MODERATE (A) 11/27/2019 1612   BILIRUBINUR NEGATIVE 11/27/2019 1612   KETONESUR 20 (A) 11/27/2019 1612   PROTEINUR 30 (A) 11/27/2019 1612   NITRITE NEGATIVE 11/27/2019 1612   LEUKOCYTESUR NEGATIVE 11/27/2019 1612   Sepsis Labs Invalid input(s): PROCALCITONIN,  WBC,  LACTICIDVEN Microbiology Recent Results (from the past 240 hour(s))  SARS CORONAVIRUS 2 (TAT 6-24 HRS) Nasopharyngeal Nasopharyngeal Swab     Status: None   Collection Time: 11/27/19  1:15 PM   Specimen: Nasopharyngeal Swab  Result Value Ref Range Status   SARS Coronavirus 2 NEGATIVE NEGATIVE Final    Comment: (NOTE) SARS-CoV-2 target nucleic acids are NOT DETECTED. The SARS-CoV-2 RNA is generally detectable in upper and lower respiratory specimens during the acute phase of infection. Negative results do not preclude SARS-CoV-2 infection, do not rule out co-infections with other pathogens, and should not be used as the sole basis for treatment or other patient management decisions. Negative results must be combined with clinical observations, patient history, and epidemiological information. The expected result is Negative. Fact Sheet for Patients: 11/29/2019 Fact Sheet for Healthcare Providers: 11/29/19 This test  is not yet approved or cleared by the [place] and  has been authorized for detection and/or diagnosis of SARS-CoV-2 by FDA under an Emergency Use Authorization (EUA). This EUA will remain  in effect (meaning this test can be used) for the duration of the COVID-19 declaration under Section 56 4(b)(1) of the Act, 21 U.S.C. section 360bbb-3(b)(1), unless the authorization is terminated or revoked sooner. Performed at [facility], [address], [place], [place] [address]   SARS CORONAVIRUS 2 (TAT 6-24 HRS) Nasopharyngeal Nasopharyngeal  Swab     Status: None   Collection Time: 12/02/19  4:11 PM   Specimen: Nasopharyngeal Swab  Result Value Ref Range Status   SARS Coronavirus 2 NEGATIVE NEGATIVE Final    Comment: (NOTE) SARS-CoV-2 target nucleic acids are NOT DETECTED. The SARS-CoV-2 RNA is generally detectable in upper and lower respiratory specimens during the acute phase of infection. Negative results do not preclude SARS-CoV-2 infection, do not rule out co-infections with other pathogens, and should not be used as the sole basis for treatment or other patient management decisions. Negative results must be combined with clinical observations, patient history, and epidemiological information. The expected result is Negative. Fact Sheet for Patients: HairSlick.no Fact Sheet for Healthcare Providers: quierodirigir.com This test is not yet approved or cleared by the [place] FDA and  has been authorized for detection and/or diagnosis of SARS-CoV-2 by FDA under an Emergency Use Authorization (EUA). This EUA will remain  in effect (meaning this test can be used) for the duration of the COVID-19 declaration under Section 56 4(b)(1) of the Act, 21 U.S.C. section 360bbb-3(b)(1), unless the authorization is terminated or revoked sooner. Performed at [facility], [address] [place], [place] [address]      Time coordinating discharge: Over 30 minutes  SIGNED:   Azucena Fallen, DO [facility] 12/03/2019, 10:54 AM Pager   If 7PM-7AM, please contact night-coverage [email or web address]

## 2019-12-04 ENCOUNTER — Encounter: Payer: Self-pay | Admitting: Internal Medicine

## 2019-12-04 ENCOUNTER — Non-Acute Institutional Stay (SKILLED_NURSING_FACILITY): Payer: Medicare Other | Admitting: Internal Medicine

## 2019-12-04 DIAGNOSIS — E785 Hyperlipidemia, unspecified: Secondary | ICD-10-CM

## 2019-12-04 DIAGNOSIS — G40209 Localization-related (focal) (partial) symptomatic epilepsy and epileptic syndromes with complex partial seizures, not intractable, without status epilepticus: Secondary | ICD-10-CM | POA: Diagnosis not present

## 2019-12-04 DIAGNOSIS — K219 Gastro-esophageal reflux disease without esophagitis: Secondary | ICD-10-CM

## 2019-12-04 DIAGNOSIS — I6930 Unspecified sequelae of cerebral infarction: Secondary | ICD-10-CM

## 2019-12-04 DIAGNOSIS — G9340 Encephalopathy, unspecified: Secondary | ICD-10-CM

## 2019-12-04 DIAGNOSIS — R55 Syncope and collapse: Secondary | ICD-10-CM | POA: Diagnosis not present

## 2019-12-04 DIAGNOSIS — R748 Abnormal levels of other serum enzymes: Secondary | ICD-10-CM

## 2019-12-04 DIAGNOSIS — I10 Essential (primary) hypertension: Secondary | ICD-10-CM

## 2019-12-04 NOTE — Progress Notes (Signed)
: Provider:  Margit Hanks., MD Location:  [facility] Room Number: 504-P Place of Service:  SNF (31)  PCP: [name], MD Patient Care Team: [name], MD as PCP - General (Family Medicine)  Extended Emergency Contact Information Primary Emergency Contact: Oletta Lamas [place] Home Phone: [phone] Relation: Daughter     Allergies: Rocephin [ceftriaxone sodium in dextrose], Atorvastatin, Pravastatin, and Rosuvastatin calcium  Chief Complaint  Patient presents with  . New Admit To SNF    New admission to [facility] SN[facility]    HPI: Patient is an 86 y.o. male with history of stroke with minimal residual weakness-absolutely quarter, aortic valve disease, hyperlipidemia, GERD, gout, complex partial seizure disorder, who presented to the [facility], ED with a fall that occurred probably between 5 PM last night and 10 AM the morning of admission.  Patient does not remember falling.  He denied any symptoms at any time.  Daughter thinks that patient fell likely due to a seizure, he takes Depakote 750 mg at home and he missed last month dose.  This morning she noted the patient was confused and did not really think about a fall.  In the ED patient's vital signs were stable.  Troponin 42, CK 1747, ethanol, INR within normal limits, chest x-ray CT head no acute disease patient was admitted to [facility] from 4/20-26 for syncope and acute metabolic encephalopathy on chronic baseline dementia.  Patient did not have incontinence but did have moderately elevated CK concerning for ROM rhabdomyolysis.  Patient had moderate ambulatory dysfunction requiring maximum assist improving mildly during hospitalization but not enough to go home.  Patient's mental status improved drastically with IV fluids and supportive care low no signs of dysrhythmia.  Patient in moderate uncontrolled blood pressure in the hospital and amlodipine  was added to her regimen.  Patient is admitted to skilled nursing facility for OT/PT.  Skilled nursing facility patient will be followed for history of CVA treated with ASA and Plavix, GERD treated with Pepcid and hyperlipidemia treated with Zetia and fish oil.  Past Medical History:  Diagnosis Date  . A-fib (HCC)   . Arthritis   . GERD (gastroesophageal reflux disease)   . Heart murmur   . HLD (hyperlipidemia)   . Seizures (HCC)    last sz 04/17/17  . Stroke [facility])    tia    Past Surgical History:  Procedure Laterality Date  . CARPAL TUNNEL RELEASE     About 10 yr ago (08/13/16)  . LOOP RECORDER INSERTION N/A 09/16/2016   Procedure: Loop Recorder Insertion;  Surgeon: Hillis Range, MD;  Location: [facility] INVASIVE CV LAB;  Service: Cardiovascular;  Laterality: N/A;  . TONSILLECTOMY      Allergies as of 12/04/2019      Reactions   Rocephin [ceftriaxone Sodium In Dextrose] Other (See Comments)   C-Diff   Atorvastatin    Other reaction(s): myalgia   Pravastatin    Other reaction(s): myalgia   Rosuvastatin Calcium    Other reaction(s): fatigued      Medication List       Accurate as of December 04, 2019  8:45 PM. If you have any questions, ask your nurse or doctor.        STOP taking these medications   LYCOPENE PO Stopped by: Merrilee Seashore, MD     TAKE these medications   acetaminophen 500 MG tablet  Commonly known as: TYLENOL Take 1,000 mg by mouth in the morning and at bedtime. What changed: Another medication with the same name was removed. Continue taking this medication, and follow the directions you see here. Changed by: Merrilee Seashore, MD   amLODipine 10 MG tablet Commonly known as: NORVASC Take 1 tablet (10 mg total) by mouth daily.   aspirin EC 325 MG tablet Take 325 mg by mouth daily.   Cholecalciferol 25 MCG (1000 UT) capsule Take 1,000 Units by mouth daily.   clopidogrel 75 MG tablet Commonly known as: PLAVIX Take 75 mg by mouth daily.   divalproex 500 MG  24 hr tablet Commonly known as: DEPAKOTE ER TAKE 1 TABLET BY MOUTH EVERY DAY WITH 250MG  TABLET FOR TOTAL OF 750MG    divalproex 250 MG 24 hr tablet Commonly known as: DEPAKOTE ER TAKE 1 TABLET BY MOUTH EVERY DAY WITH 500MG  TABLET FOR A TOTAL OF 750MG    famotidine 20 MG tablet Commonly known as: PEPCID Take 20 mg by mouth daily.   ferrous sulfate 325 (65 FE) MG EC tablet Take 325 mg by mouth daily with breakfast.   Fish Oil 1000 MG Caps Take 2,000 mg by mouth daily.   ICaps Caps Take 1 capsule by mouth daily.   Saw Palmetto 450 MG Caps Take 900 mg by mouth daily.   vitamin B-12 1000 MCG tablet Commonly known as: CYANOCOBALAMIN Take 1,000 mcg by mouth daily.   Zetia 10 MG tablet Generic drug: ezetimibe Take 10 mg by mouth daily.       No orders of the defined types were placed in this encounter.   Immunization History  Administered Date(s) Administered  . Influenza Split 06/06/2008, 05/21/2009, 05/26/2010  . Influenza, High Dose Seasonal PF 08/16/2011, 06/11/2014, 05/20/2015, 06/02/2016  . Influenza-Unspecified 05/21/2013, 05/17/2017, 05/11/2018, 06/20/2019  . PFIZER SARS-COV-2 Vaccination 09/15/2019, 10/09/2019  . Pneumococcal Conjugate-13 11/11/2014  . Pneumococcal Polysaccharide-23 01/05/2008  . Tdap 05/11/2018    Social History   Tobacco Use  . Smoking status: Never Smoker  . Smokeless tobacco: Never Used  Substance Use Topics  . Alcohol use: No    Alcohol/week: 0.0 standard drinks    Family history is   Family History  Problem Relation Age of Onset  . Hypertension Mother   . Parkinson's disease Mother   . Heart attack Mother   . Stroke Sister   . Lymphoma Sister   . Stroke Brother   . CVA Father   . Stroke Paternal Grandmother        in her early 3's  . Stroke Paternal Grandfather        in his early 23's  . Pulmonary embolism Sister        04/2018  . Diabetes Neg Hx       Review of Systems  GENERAL:  no fevers, fatigue, appetite  changes SKIN: No itching, or rash EYES: No eye pain, redness, discharge EARS: No earache, tinnitus, change in hearing NOSE: No congestion, drainage or bleeding  MOUTH/THROAT: No mouth or tooth pain, No sore throat RESPIRATORY: No cough, wheezing, SOB CARDIAC: No chest pain, palpitations, lower extremity edema  GI: No abdominal pain, No N/V/D or constipation, No heartburn or reflux  GU: No dysuria, frequency or urgency, or incontinence  MUSCULOSKELETAL: No unrelieved bone/joint pain NEUROLOGIC: No headache, dizziness or focal weakness PSYCHIATRIC: No c/o anxiety or sadness   Vitals:   12/04/19 1225  BP: 129/83  Pulse: 91  Resp: 18  Temp: (!) 97.5 F (36.4  C)  SpO2: 100%    SpO2 Readings from Last 1 Encounters:  12/04/19 100%   Body mass index is 24.21 kg/m.     Physical Exam  GENERAL APPEARANCE: Alert, conversant,  No acute distress.  SKIN: No diaphoresis rash HEAD: Normocephalic, atraumatic  EYES: Conjunctiva/lids clear. Pupils round, reactive. EOMs intact.  EARS: External exam WNL, canals clear. Hearing grossly normal.  NOSE: No deformity or discharge.  MOUTH/THROAT: Lips w/o lesions  RESPIRATORY: Breathing is even, unlabored. Lung sounds are clear   CARDIOVASCULAR: Heart RRR 5/6 systolic murmur heard at right upper sternal border and left lower sternal border, no rubs or gallops. No peripheral edema.   GASTROINTESTINAL: Abdomen is soft, non-tender, not distended w/ normal bowel sounds. GENITOURINARY: Bladder non tender, not distended  MUSCULOSKELETAL: No abnormal joints or musculature NEUROLOGIC:  Cranial nerves 2-12 grossly intact. Moves all extremities  PSYCHIATRIC: Mood and affect with some dementia, no behavioral issues  Patient Active Problem List   Diagnosis Date Noted  . Syncope   . Elevated CK   . Generalized weakness 08/09/2019  . Seizures (HCC) 06/04/2019  . Complex partial seizure with impairment of consciousness at onset [facility]) 05/29/2018  . Mild  cognitive impairment 05/29/2018  . Palpitations 09/16/2016  . Normocytic anemia 09/09/2016  . Aortic valve calcification 09/09/2016  . Aortic valve sclerosis 09/09/2016  . BPH (benign prostatic hyperplasia) 09/09/2016  . Cardiac murmur 09/09/2016  . Essential (primary) hypertension 09/09/2016  . Facial weakness status post cerebrovascular accident 09/09/2016  . H/O: stroke with residual effects 09/09/2016  . History of stroke 09/09/2016  . Prediabetes 09/09/2016  . Vitamin D deficiency 09/09/2016  . Alteration of consciousness 08/14/2016  . Acute encephalopathy 08/02/2016  . Difficulty speaking 08/02/2016  . Bronchitis 08/02/2016  . HLD (hyperlipidemia) 08/02/2016  . Stroke (HCC)   . GERD (gastroesophageal reflux disease)   . Gastroesophageal reflux disease without esophagitis   . Transient cerebral ischemia       Labs reviewed: Basic Metabolic Panel:    Component Value Date/Time   NA 142 12/03/2019 0429   NA 147 (H) 06/04/2019 1458   K 3.6 12/03/2019 0429   CL 106 12/03/2019 0429   CO2 28 12/03/2019 0429   GLUCOSE 100 (H) 12/03/2019 0429   BUN 17 12/03/2019 0429   BUN 21 06/04/2019 1458   CREATININE 1.18 12/03/2019 0429   CALCIUM 8.7 (L) 12/03/2019 0429   PROT 5.7 (L) 11/28/2019 0335   PROT 6.5 06/04/2019 1458   ALBUMIN 3.2 (L) 11/28/2019 0335   ALBUMIN 4.4 06/04/2019 1458   AST 56 (H) 11/28/2019 0335   ALT 18 11/28/2019 0335   ALKPHOS 77 11/28/2019 0335   BILITOT 0.7 11/28/2019 0335   BILITOT <0.2 06/04/2019 1458   GFRNONAA 54 (L) 12/03/2019 0429   GFRAA >60 12/03/2019 0429    Recent Labs    11/27/19 1444 11/28/19 0335 12/01/19 0231 12/02/19 0229 12/03/19 0429  NA  --    < > 143 143 142  K  --    < > 3.5 3.7 3.6  CL  --    < > 111 107 106  CO2  --    < > 24 28 28   GLUCOSE  --    < > 98 100* 100*  BUN  --    < > 18 15 17   CREATININE 1.01   < > 1.13 1.14 1.18  CALCIUM  --    < > 8.3* 8.6* 8.7*  MG 1.8  --   --   --   --  PHOS 3.4  --   --   --    --    < > = values in this interval not displayed.   Liver Function Tests: Recent Labs    08/09/19 0530 11/27/19 1143 11/28/19 0335  AST 21 48* 56*  ALT ALKPHOS 83 84 77  BILITOT 0.8 0.8 0.7  PROT 5.9* 6.8 5.7*  ALBUMIN 3.1* 3.8 3.2*   No results for input(s): LIPASE, AMYLASE in the last 8760 hours. Recent Labs    11/27/19 1455  AMMONIA <9*   CBC: Recent Labs    06/04/19 1458 08/08/19 1415 12/01/19 0231 12/02/19 0229 12/03/19 0429  WBC 7.0   < > 7.1 6.9 6.4  NEUTROABS 4.1  --   --   --   --   HGB 10.2*   < > 9.5* 9.9* 10.0*  HCT 31.0*   < > 29.0* 30.3* 30.4*  MCV 95   < > 97.0 96.5 96.5  PLT 170   < > 166 174 196   < > = values in this interval not displayed.   Lipid No results for input(s): CHOL, HDL, LDLCALC, TRIG in the last 8760 hours.  Cardiac Enzymes: Recent Labs    11/27/19 1143  CKTOTAL 1,747*   BNP: No results for input(s): BNP in the last 8760 hours. No results found for: [facility] Lab Results  Component Value Date   HGBA1C 5.6 08/03/2016   Lab Results  Component Value Date   TSH 4.094 11/27/2019   Lab Results  Component Value Date   VITAMINB12 2,819 (H) 11/27/2019   Lab Results  Component Value Date   FOLATE 14.8 11/16/2017   No results found for: IRON, TIBC, FERRITIN  Imaging and Procedures obtained prior to SNF admission: CT Head Wo Contrast  Result Date: 11/27/2019 CLINICAL DATA:  Unwitnessed fall EXAM: CT HEAD WITHOUT CONTRAST TECHNIQUE: Contiguous axial images were obtained from the base of the skull through the vertex without intravenous contrast. COMPARISON:  08/08/2019 FINDINGS: Brain: No evidence of acute infarction, hemorrhage, hydrocephalus, extra-axial collection or mass lesion/mass effect. Extensive low-density changes within the periventricular and subcortical white matter compatible with chronic microvascular ischemic change. Moderate diffuse cerebral volume loss. Vascular: Atherosclerotic calcifications  involving the large vessels of the skull base. No unexpected hyperdense vessel. Skull: Normal. Negative for fracture or focal lesion. Sinuses/Orbits: No acute finding. Other: None. IMPRESSION: 1.  No acute intracranial findings. 2.  Chronic microvascular ischemic change and cerebral volume loss. Electronically Signed   By: Duanne Guess D.O.   On: 11/27/2019 13:06   DG Chest Portable 1 View  Result Date: 11/27/2019 CLINICAL DATA:  Larey Seat yesterday. Denies chest pain or shortness of breath. EXAM: PORTABLE CHEST 1 VIEW COMPARISON:  Chest x-ray dated August 08, 2019. FINDINGS: The heart size and mediastinal contours are within normal limits. Loop recorder again noted. Normal pulmonary vascularity. Minimal bibasilar atelectasis with unchanged scarring at the left lung base. No focal consolidation, pleural effusion, or pneumothorax. No acute osseous abnormality. Unchanged large hiatal hernia. IMPRESSION: No active disease. Electronically Signed   By: Obie Dredge M.D.   On: 11/27/2019 12:19   ECHOCARDIOGRAM COMPLETE  Result Date: 11/28/2019    ECHOCARDIOGRAM REPORT   Patient Name:   Nicholas Gaines Date of Exam: 11/28/2019 Medical Rec #:  [id number]      Height:       65.0 in Accession #:    [id number]     Weight:  145.0 lb Date of Birth:  04/05/1929       BSA:          1.725 m Patient Age:    89 years       BP:           159/90 mmHg Patient Gender: M              HR:           79 bpm. Exam Location:  Inpatient Procedure: 2D Echo, Cardiac Doppler and Color Doppler Indications:    R55 Syncope  History:        Patient has no prior history of Echocardiogram examinations.                 Stroke, Arrythmias:Atrial Fibrillation, Signs/Symptoms:Murmur;                 Risk Factors:Dyslipidemia and GERD. Seizures.  Sonographer:    Elmarie Shileyiffany Dance Referring Phys: [id number] Kasandra KnudsenINKA R PAHWANI IMPRESSIONS  1. Left ventricular ejection fraction, by estimation, is 60 to 65%. The left ventricle has normal function. The left  ventricle has no regional wall motion abnormalities. Left ventricular diastolic function could not be evaluated.  2. Right ventricular systolic function is normal. The right ventricular size is normal. There is normal pulmonary artery systolic pressure. The estimated right ventricular systolic pressure is 25.7 mmHg.  3. Left atrial size was moderately dilated.  4. The mitral valve is degenerative. Mild mitral valve regurgitation. No evidence of mitral stenosis.  5. The aortic valve is tricuspid. Aortic valve regurgitation is trivial. Moderate aortic valve stenosis. Aortic valve area, by VTI measures 1.02 cm. Aortic valve mean gradient measures 24.0 mmHg. Aortic valve Vmax measures 3.19 m/s.  6. The inferior vena cava is normal in size with greater than 50% respiratory variability, suggesting right atrial pressure of 3 mmHg. FINDINGS  Left Ventricle: Left ventricular ejection fraction, by estimation, is 60 to 65%. The left ventricle has normal function. The left ventricle has no regional wall motion abnormalities. The left ventricular internal cavity size was normal in size. There is  no left ventricular hypertrophy. Left ventricular diastolic function could not be evaluated due to mitral annular calcification (moderate or greater). Left ventricular diastolic function could not be evaluated. Right Ventricle: The right ventricular size is normal. No increase in right ventricular wall thickness. Right ventricular systolic function is normal. There is normal pulmonary artery systolic pressure. The tricuspid regurgitant velocity is 2.38 m/s, and  with an assumed right atrial pressure of 3 mmHg, the estimated right ventricular systolic pressure is 25.7 mmHg. Left Atrium: Left atrial size was moderately dilated. Right Atrium: Right atrial size was normal in size. Pericardium: Trivial pericardial effusion is present. Presence of pericardial fat pad. Mitral Valve: The mitral valve is degenerative in appearance. Moderate to  severe mitral annular calcification. Mild mitral valve regurgitation. No evidence of mitral valve stenosis. Tricuspid Valve: The tricuspid valve is grossly normal. Tricuspid valve regurgitation is mild . No evidence of tricuspid stenosis. Aortic Valve: The aortic valve is tricuspid. . There is severe thickening and severe calcifcation of the aortic valve. Aortic valve regurgitation is trivial. Aortic regurgitation PHT measures 419 msec. Moderate aortic stenosis is present. There is severe  thickening of the aortic valve. There is severe calcifcation of the aortic valve. Aortic valve mean gradient measures 24.0 mmHg. Aortic valve peak gradient measures 40.7 mmHg. Aortic valve area, by VTI measures 1.02 cm. Pulmonic Valve: The pulmonic valve was grossly normal.  Pulmonic valve regurgitation is trivial. No evidence of pulmonic stenosis. Aorta: The aortic root and ascending aorta are structurally normal, with no evidence of dilitation. Venous: The inferior vena cava is normal in size with greater than 50% respiratory variability, suggesting right atrial pressure of 3 mmHg. IAS/Shunts: The atrial septum is grossly normal.  LEFT VENTRICLE PLAX 2D LVIDd:         4.30 cm LVIDs:         3.20 cm LV PW:         1.20 cm LV IVS:        0.90 cm LVOT diam:     2.00 cm LV SV:         71 LV SV Index:   41 LVOT Area:     3.14 cm  RIGHT VENTRICLE             IVC RV Basal diam:  3.10 cm     IVC diam: 1.50 cm RV Mid diam:    2.40 cm RV S prime:     18.20 cm/s TAPSE (M-mode): 2.7 cm LEFT ATRIUM              Index       RIGHT ATRIUM           Index LA diam:        2.50 cm  1.45 cm/m  RA Area:     14.30 cm LA Vol (A2C):   48.9 ml  28.34 ml/m RA Volume:   31.90 ml  18.49 ml/m LA Vol (A4C):   102.0 ml 59.12 ml/m LA Biplane Vol: 73.4 ml  42.54 ml/m  AORTIC VALVE AV Area (Vmax):    1.05 cm AV Area (Vmean):   0.99 cm AV Area (VTI):     1.02 cm AV Vmax:           319.00 cm/s AV Vmean:          214.000 cm/s AV VTI:            0.692 m AV  Peak Grad:      40.7 mmHg AV Mean Grad:      24.0 mmHg LVOT Vmax:         107.00 cm/s LVOT Vmean:        67.600 cm/s LVOT VTI:          0.225 m LVOT/AV VTI ratio: 0.33 AI PHT:            419 msec  AORTA Ao Root diam: 3.80 cm Ao Asc diam:  3.40 cm MITRAL VALVE                TRICUSPID VALVE MV Area (PHT): 2.20 cm     TR Peak grad:   22.7 mmHg MV Decel Time: 345 msec     TR Vmax:        238.00 cm/s MV E velocity: 87.90 cm/s MV A velocity: 136.00 cm/s  SHUNTS MV E/A ratio:  0.65         Systemic VTI:  0.22 m                             Systemic Diam: 2.00 cm Eleonore Chiquito MD Electronically signed by Eleonore Chiquito MD Signature Date/Time: 11/28/2019/5:28:43 PM    Final      Not all labs, radiology exams or other studies done during hospitalization come through on my EPIC note; however they are reviewed  by me.    Assessment and Plan  Syncope versus seizure-although patient.  Post ictal to daughter there was no incontinence of bowel or bladder.  Patient's loop recorder did not record any arrhythmias or other incidents, CT head was negative and patient appeared improved with IV fluids and supportive care and had no further episodes.  Other labs were unremarkable without leukocytosis, fever or concern for infection.   SNF-admitted for OT/PT; continue Depakote ER 750 mg daily  Rhabdomyolysis-CK 1747; improved with IV fluid  Hypertension-uncontrolled in the hospital and amlodipine was added to her regimen SNF-continue Norvasc 10 mg daily  History of stroke prior SNF-continue ASA 325 mg daily and Plavix 75 mg daily  GERD SNF-not stated as uncontrolled; continue Pepcid 20 mg daily  Hyperlipidemia SNF-no status uncontrolled; continue Crestor 2000 mg daily and Zetia 10 mg daily    Time spent greater than 45 minutes;> 50% of time with patient was spent reviewing records, labs, tests and studies, counseling and developing plan of care  Margit Hanks, MD

## 2019-12-05 ENCOUNTER — Encounter: Payer: Self-pay | Admitting: Internal Medicine

## 2019-12-07 ENCOUNTER — Telehealth: Payer: Self-pay

## 2019-12-07 NOTE — Telephone Encounter (Signed)
Linq alert received- 23 new AF episodes since last transmission.  ECGs only avail on 1 that appears SR with PACs.  Need manual transmission for other episode data.  Attemtped to reach pt daughter , Elita Quick (DPR on file), no answer left message requesting manual transmission, provided DC phone number for callback.

## 2019-12-07 NOTE — Telephone Encounter (Signed)
Manual transmission received 12/07/19. AF burden reported at 3.1% (23 episodes since 11/26/19). All ECG's reviewed and appear SR w/ ectopy. Patients ILR currently programmed to detect AF episodes >=6 minutes and "less sensitive". Will request order from Dr. Johney Frame to reprogram ILR to >=10 minutes and "least sensitive" d/t frequent false episodes.  Most recent: 12/06/19    Longest: 12/02/19 Lasted 44 minutes

## 2019-12-10 ENCOUNTER — Ambulatory Visit (INDEPENDENT_AMBULATORY_CARE_PROVIDER_SITE_OTHER): Payer: Medicare Other | Admitting: *Deleted

## 2019-12-10 DIAGNOSIS — R002 Palpitations: Secondary | ICD-10-CM

## 2019-12-11 LAB — CUP PACEART REMOTE DEVICE CHECK
Date Time Interrogation Session: 20210502003448
Implantable Pulse Generator Implant Date: 20180208

## 2019-12-12 NOTE — Progress Notes (Signed)
Carelink Summary Report / Loop Recorder 

## 2019-12-14 ENCOUNTER — Telehealth: Payer: Self-pay

## 2019-12-14 NOTE — Telephone Encounter (Signed)
Received carelink alert on 12/14/19 at 12:05 AM for AF detection, 10 miuntes.   Called to assess and request manual transmission (3 new episodes of AF), spoke to [facility] , daughter (DPR), states patient is in a rehab facility and she is going to see patient tomorrow 12/15/19 and will send transmission then. Currently waiting for orders for reprogramming changes, per note on 12/07/19.

## 2019-12-17 ENCOUNTER — Other Ambulatory Visit: Payer: Self-pay | Admitting: Internal Medicine

## 2019-12-17 ENCOUNTER — Non-Acute Institutional Stay (SKILLED_NURSING_FACILITY): Payer: Medicare Other | Admitting: Internal Medicine

## 2019-12-17 ENCOUNTER — Encounter: Payer: Self-pay | Admitting: Internal Medicine

## 2019-12-17 DIAGNOSIS — R569 Unspecified convulsions: Secondary | ICD-10-CM

## 2019-12-17 DIAGNOSIS — Z8673 Personal history of transient ischemic attack (TIA), and cerebral infarction without residual deficits: Secondary | ICD-10-CM | POA: Diagnosis not present

## 2019-12-17 DIAGNOSIS — R002 Palpitations: Secondary | ICD-10-CM

## 2019-12-17 DIAGNOSIS — I10 Essential (primary) hypertension: Secondary | ICD-10-CM | POA: Diagnosis not present

## 2019-12-17 DIAGNOSIS — R55 Syncope and collapse: Secondary | ICD-10-CM

## 2019-12-17 DIAGNOSIS — G3184 Mild cognitive impairment, so stated: Secondary | ICD-10-CM

## 2019-12-17 MED ORDER — DIVALPROEX SODIUM ER 250 MG PO TB24: ORAL_TABLET | ORAL | 0 refills | Status: DC

## 2019-12-17 MED ORDER — DIVALPROEX SODIUM ER 500 MG PO TB24: ORAL_TABLET | ORAL | 0 refills | Status: DC

## 2019-12-17 MED ORDER — FAMOTIDINE 20 MG PO TABS: 20.0000 mg | ORAL_TABLET | Freq: Every day | ORAL | 0 refills | Status: DC

## 2019-12-17 MED ORDER — ZETIA 10 MG PO TABS: 10.0000 mg | ORAL_TABLET | Freq: Every day | ORAL | 0 refills | Status: DC

## 2019-12-17 MED ORDER — AMLODIPINE BESYLATE 10 MG PO TABS: 10.0000 mg | ORAL_TABLET | Freq: Every day | ORAL | 0 refills | Status: DC

## 2019-12-17 MED ORDER — VITAMIN B-12 1000 MCG PO TABS: 1000.0000 ug | ORAL_TABLET | Freq: Every day | ORAL | 0 refills | Status: AC

## 2019-12-17 MED ORDER — CLOPIDOGREL BISULFATE 75 MG PO TABS: 75.0000 mg | ORAL_TABLET | Freq: Every day | ORAL | 0 refills | Status: AC

## 2019-12-17 MED ORDER — FERROUS SULFATE 325 (65 FE) MG PO TBEC: 325.0000 mg | DELAYED_RELEASE_TABLET | Freq: Every day | ORAL | 0 refills | Status: DC

## 2019-12-17 MED ORDER — ASPIRIN EC 325 MG PO TBEC: 325.0000 mg | DELAYED_RELEASE_TABLET | Freq: Every day | ORAL | 0 refills | Status: DC

## 2019-12-17 MED ORDER — CHOLECALCIFEROL 25 MCG (1000 UT) PO CAPS: 1000.0000 [IU] | ORAL_CAPSULE | Freq: Every day | ORAL | 0 refills | Status: AC

## 2019-12-17 NOTE — Telephone Encounter (Signed)
Manual transmission received 12/15/19.

## 2019-12-17 NOTE — Progress Notes (Signed)
Location:    [facility] Room Number: 102/P Place of Service:  SNF (31)  Provider:Valkyrie Guardiola, PA-C  PCP: [name], MD Patient Care Team: [name], MD as PCP - General (Family Medicine)  Extended Emergency Contact Information Primary Emergency Contact: Zachary George of [place] Home Phone: [phone] Relation: Daughter  Code Status: DNR Goals of care:  Advanced Directive information Advanced Directives 12/17/2019  Does Patient Have a Medical Advance Directive? Yes  Type of Advance Directive Out of facility DNR (pink MOST or yellow form)  Does patient want to make changes to medical advance directive? No - Patient declined  Copy of Healthcare Power of Attorney in Chart? -  Would patient like information on creating a medical advance directive? -  Pre-existing out of facility DNR order (yellow form or pink MOST form) -     Allergies  Allergen Reactions  . Rocephin [Ceftriaxone Sodium In Dextrose] Other (See Comments)    C-Diff  . Atorvastatin     Other reaction(s): myalgia  . Pravastatin     Other reaction(s): myalgia  . Rosuvastatin Calcium     Other reaction(s): fatigued    Chief Complaint  Patient presents with  . Discharge Note    Discharge Visit    HPI:  86 y.o. male seen today for discharge from facility slated for tomorrow Tuesday, October 18, 2019.  He has a previous medical history of CVA with minimal residual weakness as well as aortic valve disease hyperlipidemia GERD gout complex partial seizure disorder.  He also has a pacemaker.  With a history of atrial fibrillation.  He initially presented to the emergency room with a fall.  He did not remember falling or any symptoms at the time-his daughter thought the fall is likely due to his seizure-he does take Depakote at home but apparently missed some doses.  Marland Kitchen  He was admitted to the hospital for syncopal and acute metabolic  encephalopathy on chronic dementia.  He had an elevated CK concerning for rhabdomyolysis.  He had moderate ambulatory dysfunction requiring maximum assist and was thought too weak to go home so he was admitted to skilled nursing.  His mental status improved once he got IV fluids and supportive care.  There were no signs of dysrhythmia.  His Norvasc was added in the hospital secondary to the elevated blood pressure-this appears to have stabilized recent systolics appear to be in the 120s diastolic in the 60s.  His stay here has been unremarkable he appears to have gained strength-cardiology has been recording his heart rhythm appears to have been interrogated a couple times during his stay here.  Currently he denies any syncope weakness dizziness he is looking forward to going home.  He does have a supportive family.  He will need continued PT and OT for strengthening            Past Medical History:  Diagnosis Date  . A-fib (HCC)   . Arthritis   . GERD (gastroesophageal reflux disease)   . Heart murmur   . HLD (hyperlipidemia)   . Seizures (HCC)    last sz 04/17/17  . Stroke [facility])    tia    Past Surgical History:  Procedure Laterality Date  . CARPAL TUNNEL RELEASE     About 10 yr ago (08/13/16)  . LOOP RECORDER INSERTION N/A 09/16/2016   Procedure: Loop Recorder Insertion;  Surgeon: Hillis Range, MD;  Location: [facility] INVASIVE CV LAB;  Service: Cardiovascular;  Laterality: N/A;  . TONSILLECTOMY        reports that he has never smoked. He has never used smokeless tobacco. He reports that he does not drink alcohol or use drugs. Social History   Socioeconomic History  . Marital status: Widowed    Spouse name: Not on file  . Number of children: 1  . Years of education: 8512  . Highest education level: Not on file  Occupational History  . Occupation: Retired  Tobacco Use  . Smoking status: Never Smoker  . Smokeless tobacco: Never Used  Substance and Sexual Activity   . Alcohol use: No    Alcohol/week: 0.0 standard drinks  . Drug use: No  . Sexual activity: Not on file  Other Topics Concern  . Not on file  Social History Narrative   04/19/17 Lives alone. Uses cane/walker for ambulation. Caffeine use: cup of coffee daily   Right handed   Social Determinants of Health   Financial Resource Strain:   . Difficulty of Paying Living Expenses:   Food Insecurity:   . Worried About Programme researcher, broadcasting/film/videounning Out of Food in the Last Year:   . Baristaan Out of Food in the Last Year:   Transportation Needs:   . Freight forwarderLack of Transportation (Medical):   Marland Kitchen. Lack of Transportation (Non-Medical):   Physical Activity:   . Days of Exercise per Week:   . Minutes of Exercise per Session:   Stress:   . Feeling of Stress :   Social Connections:   . Frequency of Communication with Friends and Family:   . Frequency of Social Gatherings with Friends and Family:   . Attends Religious Services:   . Active Member of Clubs or Organizations:   . Attends BankerClub or Organization Meetings:   Marland Kitchen. Marital Status:   Intimate Partner Violence:   . Fear of Current or Ex-Partner:   . Emotionally Abused:   Marland Kitchen. Physically Abused:   . Sexually Abused:    Functional Status Survey:    Allergies  Allergen Reactions  . Rocephin [Ceftriaxone Sodium In Dextrose] Other (See Comments)    C-Diff  . Atorvastatin     Other reaction(s): myalgia  . Pravastatin     Other reaction(s): myalgia  . Rosuvastatin Calcium     Other reaction(s): fatigued    Pertinent  Health Maintenance Due  Topic Date Due  . INFLUENZA VACCINE  03/09/2020  . PNA vac Low Risk Adult  Completed    Medications: Allergies as of 12/17/2019      Reactions   Rocephin [ceftriaxone Sodium In Dextrose] Other (See Comments)   C-Diff   Atorvastatin    Other reaction(s): myalgia   Pravastatin    Other reaction(s): myalgia   Rosuvastatin Calcium    Other reaction(s): fatigued      Medication List       Accurate as of Dec 17, 2019  4:33 PM.  If you have any questions, ask your nurse or doctor.        STOP taking these medications   Saw Palmetto 450 MG Caps Stopped by: Edmon CrapeArlo Leniyah Martell, PA-C     TAKE these medications   acetaminophen 500 MG tablet Commonly known as: TYLENOL Take 1,000 mg by mouth in the morning and at bedtime.   amLODipine 10 MG tablet Commonly known as: NORVASC Take 1 tablet (10 mg total) by mouth daily.   aspirin EC 325 MG tablet Take 325 mg by mouth daily.  BIOFREEZE EX Boost Breeze BID d/t risk of malnutrition.   Cholecalciferol 25 MCG (1000 UT) capsule Take 1,000 Units by mouth daily.   clopidogrel 75 MG tablet Commonly known as: PLAVIX Take 75 mg by mouth daily.   divalproex 500 MG 24 hr tablet Commonly known as: DEPAKOTE ER TAKE 1 TABLET BY MOUTH EVERY DAY WITH 250MG  TABLET FOR TOTAL OF 750MG    divalproex 250 MG 24 hr tablet Commonly known as: DEPAKOTE ER TAKE 1 TABLET BY MOUTH EVERY DAY WITH 500MG  TABLET FOR A TOTAL OF 750MG    famotidine 20 MG tablet Commonly known as: PEPCID Take 20 mg by mouth daily.   ferrous sulfate 325 (65 FE) MG EC tablet Take 325 mg by mouth daily with breakfast.   Fish Oil 1000 MG Caps Take 2,000 mg by mouth daily.   ICaps Caps Take 1 capsule by mouth daily.   vitamin B-12 1000 MCG tablet Commonly known as: CYANOCOBALAMIN Take 1,000 mcg by mouth daily.   Zetia 10 MG tablet Generic drug: ezetimibe Take 10 mg by mouth daily.       Review of Systems   General is not complaining of any fever or chills.  Skin does not complain of rashes itching or diaphoresis.  Head ears eyes nose mouth and throat is not complain of visual changes or sore throat.  Respiratory does not complain of being short of breath or having a cough.  Cardiac does not complain of chest pain.  GI is not complaining of abdominal discomfort nausea vomiting diarrhea constipation.  GU no complaints of dysuria.  Musculoskeletal is not complaining of joint pain apparently  has gained some strength during his stay here.  Neurologic does not complain of dizziness or syncope at this time does have a history of seizures but apparently has been seizure-free during his stay here.  Psych does have a history of dementia but appears to do well with supportive care nursing does not report any behaviors    Vitals:   12/17/19 1624  BP: 120/60  Pulse: 78  Resp: 18  Temp: 98 F (36.7 C)  TempSrc: Oral  SpO2: 100%  Weight: 140 lb 12.8 oz (63.9 kg)  Height: 5\' 6"  (1.676 m)   Body mass index is 22.73 kg/m. Physical Exam   In general this is a very pleasant elderly male in no distress sitting comfortably on the side of his bed.  His skin is warm and dry   Eyes visual acuity appears to be intact sclera and conjunctive are clear.  Oropharynx clear mucous membranes moist.  Chest is clear to auscultation there is no labored breathing.  Heart is largely regular rate and rhythm with some occasional irregular beats at times he does have a 4  out of 6 systolic murmur.  He does not really have significant lower extremity edema.  His abdomen is soft nontender with positive bowel sounds.  Musculoskeletal does move all extremities x4 it appears with relatively baseline strength.  Neurologic appears grossly intact cannot really appreciate true lateralizing findings.  Psych he is pleasant and appropriate does have some cognitive deficits but does it appears quite well with supportive care.    Labs reviewed:  Dec 11, 2019.  WBC 7.5 hemoglobin 10.0 platelets 192.  Sodium 143 potassium 4.1 BUN 28.3 creatinine 1.19.   Basic Metabolic Panel: Recent Labs    11/27/19 1444 11/28/19 0335 12/01/19 0231 12/02/19 0229 12/03/19 0429  NA  --    < > 143 143 142  K  --    < >  3.5 3.7 3.6  CL  --    < > 111 107 106  CO2  --    < > 24 28 28   GLUCOSE  --    < > 98 100* 100*  BUN  --    < > 18 15 17   CREATININE 1.01   < > 1.13 1.14 1.18  CALCIUM  --    < > 8.3* 8.6*  8.7*  MG 1.8  --   --   --   --   PHOS 3.4  --   --   --   --    < > = values in this interval not displayed.   Liver Function Tests: Recent Labs    08/09/19 0530 11/27/19 1143 11/28/19 0335  AST 21 48* 56*  ALT 10 18 18   ALKPHOS 83 84 77  BILITOT 0.8 0.8 0.7  PROT 5.9* 6.8 5.7*  ALBUMIN 3.1* 3.8 3.2*   No results for input(s): LIPASE, AMYLASE in the last 8760 hours. Recent Labs    11/27/19 1455  AMMONIA <9*   CBC: Recent Labs    06/04/19 1458 08/08/19 1415 12/01/19 0231 12/02/19 0229 12/03/19 0429  WBC 7.0   < > 7.1 6.9 6.4  NEUTROABS 4.1  --   --   --   --   HGB 10.2*   < > 9.5* 9.9* 10.0*  HCT 31.0*   < > 29.0* 30.3* 30.4*  MCV 95   < > 97.0 96.5 96.5  PLT 170   < > 166 174 196   < > = values in this interval not displayed.   Cardiac Enzymes: Recent Labs    11/27/19 1143  CKTOTAL 1,747*   BNP: Invalid input(s): POCBNP CBG: Recent Labs    11/30/19 2121 12/01/19 0612 12/03/19 0528  GLUCAP 98 92 85    Procedures and Imaging Studies During Stay: CT Head Wo Contrast  Result Date: 11/27/2019 CLINICAL DATA:  Unwitnessed fall EXAM: CT HEAD WITHOUT CONTRAST TECHNIQUE: Contiguous axial images were obtained from the base of the skull through the vertex without intravenous contrast. COMPARISON:  08/08/2019 FINDINGS: Brain: No evidence of acute infarction, hemorrhage, hydrocephalus, extra-axial collection or mass lesion/mass effect. Extensive low-density changes within the periventricular and subcortical white matter compatible with chronic microvascular ischemic change. Moderate diffuse cerebral volume loss. Vascular: Atherosclerotic calcifications involving the large vessels of the skull base. No unexpected hyperdense vessel. Skull: Normal. Negative for fracture or focal lesion. Sinuses/Orbits: No acute finding. Other: None. IMPRESSION: 1.  No acute intracranial findings. 2.  Chronic microvascular ischemic change and cerebral volume loss. Electronically Signed    By: 12/05/19 D.O.   On: 11/27/2019 13:06   DG Chest Portable 1 View  Result Date: 11/27/2019 CLINICAL DATA:  Duanne Guess yesterday. Denies chest pain or shortness of breath. EXAM: PORTABLE CHEST 1 VIEW COMPARISON:  Chest x-ray dated August 08, 2019. FINDINGS: The heart size and mediastinal contours are within normal limits. Loop recorder again noted. Normal pulmonary vascularity. Minimal bibasilar atelectasis with unchanged scarring at the left lung base. No focal consolidation, pleural effusion, or pneumothorax. No acute osseous abnormality. Unchanged large hiatal hernia. IMPRESSION: No active disease. Electronically Signed   By: 11/29/2019 M.D.   On: 11/27/2019 12:19   ECHOCARDIOGRAM COMPLETE  Result Date: 11/28/2019    ECHOCARDIOGRAM REPORT   Patient Name:   Nicholas Gaines Date of Exam: 11/28/2019 Medical Rec #:  11/30/2019      Height:  65.0 in Accession #:    [id number]     Weight:       145.0 lb Date of Birth:  [birth year]       BSA:          1.725 m Patient Age:    89 years       BP:           159/90 mmHg Patient Gender: M              HR:           79 bpm. Exam Location:  Inpatient Procedure: 2D Echo, Cardiac Doppler and Color Doppler Indications:    R55 Syncope  History:        Patient has no prior history of Echocardiogram examinations.                 Stroke, Arrythmias:Atrial Fibrillation, Signs/Symptoms:Murmur;                 Risk Factors:Dyslipidemia and GERD. Seizures.  Sonographer:    Elmarie Shiley Dance Referring Phys: [id number] Kasandra Knudsen PAHWANI IMPRESSIONS  1. Left ventricular ejection fraction, by estimation, is 60 to 65%. The left ventricle has normal function. The left ventricle has no regional wall motion abnormalities. Left ventricular diastolic function could not be evaluated.  2. Right ventricular systolic function is normal. The right ventricular size is normal. There is normal pulmonary artery systolic pressure. The estimated right ventricular systolic pressure is 25.7 mmHg.  3.  Left atrial size was moderately dilated.  4. The mitral valve is degenerative. Mild mitral valve regurgitation. No evidence of mitral stenosis.  5. The aortic valve is tricuspid. Aortic valve regurgitation is trivial. Moderate aortic valve stenosis. Aortic valve area, by VTI measures 1.02 cm. Aortic valve mean gradient measures 24.0 mmHg. Aortic valve Vmax measures 3.19 m/s.  6. The inferior vena cava is normal in size with greater than 50% respiratory variability, suggesting right atrial pressure of 3 mmHg. FINDINGS  Left Ventricle: Left ventricular ejection fraction, by estimation, is 60 to 65%. The left ventricle has normal function. The left ventricle has no regional wall motion abnormalities. The left ventricular internal cavity size was normal in size. There is  no left ventricular hypertrophy. Left ventricular diastolic function could not be evaluated due to mitral annular calcification (moderate or greater). Left ventricular diastolic function could not be evaluated. Right Ventricle: The right ventricular size is normal. No increase in right ventricular wall thickness. Right ventricular systolic function is normal. There is normal pulmonary artery systolic pressure. The tricuspid regurgitant velocity is 2.38 m/s, and  with an assumed right atrial pressure of 3 mmHg, the estimated right ventricular systolic pressure is 25.7 mmHg. Left Atrium: Left atrial size was moderately dilated. Right Atrium: Right atrial size was normal in size. Pericardium: Trivial pericardial effusion is present. Presence of pericardial fat pad. Mitral Valve: The mitral valve is degenerative in appearance. Moderate to severe mitral annular calcification. Mild mitral valve regurgitation. No evidence of mitral valve stenosis. Tricuspid Valve: The tricuspid valve is grossly normal. Tricuspid valve regurgitation is mild . No evidence of tricuspid stenosis. Aortic Valve: The aortic valve is tricuspid. . There is severe thickening and  severe calcifcation of the aortic valve. Aortic valve regurgitation is trivial. Aortic regurgitation PHT measures 419 msec. Moderate aortic stenosis is present. There is severe  thickening of the aortic valve. There is severe calcifcation of the aortic valve. Aortic valve mean gradient measures 24.0 mmHg. Aortic valve peak gradient  measures 40.7 mmHg. Aortic valve area, by VTI measures 1.02 cm. Pulmonic Valve: The pulmonic valve was grossly normal. Pulmonic valve regurgitation is trivial. No evidence of pulmonic stenosis. Aorta: The aortic root and ascending aorta are structurally normal, with no evidence of dilitation. Venous: The inferior vena cava is normal in size with greater than 50% respiratory variability, suggesting right atrial pressure of 3 mmHg. IAS/Shunts: The atrial septum is grossly normal.  LEFT VENTRICLE PLAX 2D LVIDd:         4.30 cm LVIDs:         3.20 cm LV PW:         1.20 cm LV IVS:        0.90 cm LVOT diam:     2.00 cm LV SV:         71 LV SV Index:   41 LVOT Area:     3.14 cm  RIGHT VENTRICLE             IVC RV Basal diam:  3.10 cm     IVC diam: 1.50 cm RV Mid diam:    2.40 cm RV S prime:     18.20 cm/s TAPSE (M-mode): 2.7 cm LEFT ATRIUM              Index       RIGHT ATRIUM           Index LA diam:        2.50 cm  1.45 cm/m  RA Area:     14.30 cm LA Vol (A2C):   48.9 ml  28.34 ml/m RA Volume:   31.90 ml  18.49 ml/m LA Vol (A4C):   102.0 ml 59.12 ml/m LA Biplane Vol: 73.4 ml  42.54 ml/m  AORTIC VALVE AV Area (Vmax):    1.05 cm AV Area (Vmean):   0.99 cm AV Area (VTI):     1.02 cm AV Vmax:           319.00 cm/s AV Vmean:          214.000 cm/s AV VTI:            0.692 m AV Peak Grad:      40.7 mmHg AV Mean Grad:      24.0 mmHg LVOT Vmax:         107.00 cm/s LVOT Vmean:        67.600 cm/s LVOT VTI:          0.225 m LVOT/AV VTI ratio: 0.33 AI PHT:            419 msec  AORTA Ao Root diam: 3.80 cm Ao Asc diam:  3.40 cm MITRAL VALVE                TRICUSPID VALVE MV Area (PHT): 2.20 cm      TR Peak grad:   22.7 mmHg MV Decel Time: 345 msec     TR Vmax:        238.00 cm/s MV E velocity: 87.90 cm/s MV A velocity: 136.00 cm/s  SHUNTS MV E/A ratio:  0.65         Systemic VTI:  0.22 m                             Systemic Diam: 2.00 cm Lennie Odor MD Electronically signed by Lennie Odor MD Signature Date/Time: 11/28/2019/5:28:43 PM    Final    CUP PACEART INCLINIC DEVICE  CHECK  Result Date: 11/26/2019 Loop check in clinic. Battery status: good. R-waves 0.34 mV. 0 symptom episodes, 0  tachy episodes, 0  pause episodes, 0 brady episodes.  113 AF episodes that are false  SR with PVCs, AF detection programmed to least sensative . Monthly summary reports and ROV with Dr Rayann Heman prn.  CUP [place]  Result Date: 12/11/2019 Carelink summary report received. Battery status OK. Normal device function. No new symptom episodes, tachy episodes, brady, or pause episodes. No new AF episodes since "AF" alert. All episodes reviewed (AF burden logged as 3.1%, 78 episodes) and all reported as SR/ST with frequent ectopics. Reprogramming done in clinic on 12/21/19. Monthly summary reports and ROV/PRN   Assessment/Plan:    #1 history of syncope versus seizure-work-up in the hospital apparently did not show specific etiology-his loop recorder did not record any arrhythmias or other incidents-CT of the head was negative and he did improve with IV fluids and supportive care without further episodes.  He continues on Depakote 750 mg extended release daily.  In regards to suspected rhabdomylolysis apparently this improved with IV fluids.  2.  Hypertension blood pressure was elevated in the hospital and Norvasc was added-he is on 10 mg dose this appears to be stabilized with recent blood pressures 120/60-121/64.  3.  History of CVA he continues on aspirin 325 mg a day and Plavix 75 mg a day he appears to have minimal residual deficits.  4.  History of GERD he is on Pepcid 20 mg a day this  apparently has been stable during his stay here.  5.  History of anemia he is on iron 325 mg a day hemoglobin was 10.0 most recent lab this appears relatively stable.  6.  History of hyperlipidemia he is on Zetia 10 mg a day and fish oil 2000 mg a day-not stated as uncontrolled will defer to primary care provider for follow-up.  7. History of A. fib-Patient is being monitored by cardiology with recorder-.  8.-History of mild cognitive impairments---he appears to be doing well with supportive care-nursing has not noted any behaviors-  He will need expedient follow-up by primary care provider-again will need PT and OT as well.  He appears to be doing well and stable but will need expedient follow-up   [id number]  greater than 30 minutes spent on this discharge summary-greater than 50% of time spent coordinating a plan of care for numerous diagnoses

## 2019-12-18 ENCOUNTER — Other Ambulatory Visit: Payer: Self-pay | Admitting: Internal Medicine

## 2019-12-20 ENCOUNTER — Telehealth: Payer: Self-pay

## 2019-12-20 NOTE — Telephone Encounter (Signed)
Linq event report. AF detection, 8 minutes, appears SR/ST with PACs. Counters indicate 3 AF.  Only 1 EGM provided, need full transmission for other episodes.   This has been ongoing issue with pt.  Daughter does not live with patient.  She will send a manual transmission when she next visits patient.  She usually visits him at elast once per week.  D/W [name] the option of automatically sending a manual transmission weekly  Until we get approval from MD to turn off alert.

## 2019-12-21 DIAGNOSIS — D509 Iron deficiency anemia, unspecified: Secondary | ICD-10-CM | POA: Diagnosis not present

## 2019-12-21 DIAGNOSIS — I358 Other nonrheumatic aortic valve disorders: Secondary | ICD-10-CM | POA: Diagnosis not present

## 2019-12-21 DIAGNOSIS — M5126 Other intervertebral disc displacement, lumbar region: Secondary | ICD-10-CM | POA: Diagnosis not present

## 2019-12-21 DIAGNOSIS — K219 Gastro-esophageal reflux disease without esophagitis: Secondary | ICD-10-CM | POA: Diagnosis not present

## 2019-12-21 DIAGNOSIS — I10 Essential (primary) hypertension: Secondary | ICD-10-CM | POA: Diagnosis not present

## 2019-12-21 DIAGNOSIS — G5603 Carpal tunnel syndrome, bilateral upper limbs: Secondary | ICD-10-CM | POA: Diagnosis not present

## 2019-12-21 DIAGNOSIS — I69398 Other sequelae of cerebral infarction: Secondary | ICD-10-CM | POA: Diagnosis not present

## 2019-12-21 DIAGNOSIS — E782 Mixed hyperlipidemia: Secondary | ICD-10-CM | POA: Diagnosis not present

## 2019-12-21 DIAGNOSIS — G9341 Metabolic encephalopathy: Secondary | ICD-10-CM | POA: Diagnosis not present

## 2019-12-21 DIAGNOSIS — I4891 Unspecified atrial fibrillation: Secondary | ICD-10-CM | POA: Diagnosis not present

## 2019-12-21 DIAGNOSIS — N400 Enlarged prostate without lower urinary tract symptoms: Secondary | ICD-10-CM | POA: Diagnosis not present

## 2019-12-21 DIAGNOSIS — M1991 Primary osteoarthritis, unspecified site: Secondary | ICD-10-CM | POA: Diagnosis not present

## 2019-12-21 DIAGNOSIS — R5381 Other malaise: Secondary | ICD-10-CM | POA: Diagnosis not present

## 2019-12-21 DIAGNOSIS — I69392 Facial weakness following cerebral infarction: Secondary | ICD-10-CM | POA: Diagnosis not present

## 2019-12-21 DIAGNOSIS — F0390 Unspecified dementia without behavioral disturbance: Secondary | ICD-10-CM | POA: Diagnosis not present

## 2019-12-21 DIAGNOSIS — R7303 Prediabetes: Secondary | ICD-10-CM | POA: Diagnosis not present

## 2019-12-23 NOTE — Telephone Encounter (Signed)
Ok to reprogram ILR  Hillis Range MD, [facility] 12/23/2019 1:07 PM

## 2019-12-23 NOTE — Telephone Encounter (Signed)
EGM consistent with SR with PAC's.  Reprogram ILR to minimize false AF detections  Hillis Range MD, [facility] 12/23/2019 1:17 PM

## 2019-12-24 NOTE — Telephone Encounter (Signed)
Patient is at a Nursing facility.  Attempted to speak with daughter about bringing patient in for reprogramming.  She was unable to talk at time of call.  Will try calling ehr again in an hour.

## 2019-12-24 NOTE — Telephone Encounter (Signed)
Spoke with pt daughter Elita Quick. She is agreeable to bringing patient into office for reprogramming of ILR however it needs to be not for a couple of weeks as pt currently receiving Home care PT and  Has other appointments.  In meantime, [name] will continue to send weekly manual transmissions to avoid the issue of episodes coming through without ECGS.

## 2019-12-27 DIAGNOSIS — R5381 Other malaise: Secondary | ICD-10-CM | POA: Diagnosis not present

## 2019-12-27 DIAGNOSIS — I10 Essential (primary) hypertension: Secondary | ICD-10-CM | POA: Diagnosis not present

## 2019-12-27 DIAGNOSIS — R55 Syncope and collapse: Secondary | ICD-10-CM | POA: Diagnosis not present

## 2019-12-27 DIAGNOSIS — Z8739 Personal history of other diseases of the musculoskeletal system and connective tissue: Secondary | ICD-10-CM | POA: Diagnosis not present

## 2020-01-10 ENCOUNTER — Ambulatory Visit (INDEPENDENT_AMBULATORY_CARE_PROVIDER_SITE_OTHER): Payer: Medicare Other | Admitting: Emergency Medicine

## 2020-01-10 ENCOUNTER — Ambulatory Visit (INDEPENDENT_AMBULATORY_CARE_PROVIDER_SITE_OTHER): Payer: Medicare Other | Admitting: *Deleted

## 2020-01-10 ENCOUNTER — Other Ambulatory Visit: Payer: Self-pay

## 2020-01-10 DIAGNOSIS — R002 Palpitations: Secondary | ICD-10-CM | POA: Diagnosis not present

## 2020-01-10 DIAGNOSIS — Z95818 Presence of other cardiac implants and grafts: Secondary | ICD-10-CM

## 2020-01-10 DIAGNOSIS — I639 Cerebral infarction, unspecified: Secondary | ICD-10-CM

## 2020-01-10 LAB — CUP PACEART INCLINIC DEVICE CHECK
Date Time Interrogation Session: 20210603101111
Implantable Pulse Generator Implant Date: 20180208

## 2020-01-10 LAB — CUP PACEART REMOTE DEVICE CHECK
Date Time Interrogation Session: 20210602232223
Implantable Pulse Generator Implant Date: 20180208

## 2020-01-10 NOTE — Progress Notes (Signed)
Loop check in clinic. Battery status: good. R-waves 0.25mV. No symptom, tachy, brady, or pause episodes. 86 AF episodes--all available ECGs indicate SR/ST w/frequent ectopy. Per order from Dr. Johney Frame, reprogrammed AF episode storage to >=10 min and AT/AF ectopy rejection to aggressive. Requested manual transmission to update Carelink network. Monthly summary reports and ROV with Dr. Johney Frame PRN.

## 2020-01-15 NOTE — Progress Notes (Signed)
Carelink Summary Report / Loop Recorder 

## 2020-01-20 DIAGNOSIS — I69392 Facial weakness following cerebral infarction: Secondary | ICD-10-CM | POA: Diagnosis not present

## 2020-01-20 DIAGNOSIS — D509 Iron deficiency anemia, unspecified: Secondary | ICD-10-CM | POA: Diagnosis not present

## 2020-01-20 DIAGNOSIS — I358 Other nonrheumatic aortic valve disorders: Secondary | ICD-10-CM | POA: Diagnosis not present

## 2020-01-20 DIAGNOSIS — M1991 Primary osteoarthritis, unspecified site: Secondary | ICD-10-CM | POA: Diagnosis not present

## 2020-01-20 DIAGNOSIS — I10 Essential (primary) hypertension: Secondary | ICD-10-CM | POA: Diagnosis not present

## 2020-01-20 DIAGNOSIS — I69398 Other sequelae of cerebral infarction: Secondary | ICD-10-CM | POA: Diagnosis not present

## 2020-01-20 DIAGNOSIS — R5381 Other malaise: Secondary | ICD-10-CM | POA: Diagnosis not present

## 2020-01-20 DIAGNOSIS — E782 Mixed hyperlipidemia: Secondary | ICD-10-CM | POA: Diagnosis not present

## 2020-01-20 DIAGNOSIS — K219 Gastro-esophageal reflux disease without esophagitis: Secondary | ICD-10-CM | POA: Diagnosis not present

## 2020-01-20 DIAGNOSIS — I4891 Unspecified atrial fibrillation: Secondary | ICD-10-CM | POA: Diagnosis not present

## 2020-01-20 DIAGNOSIS — G5603 Carpal tunnel syndrome, bilateral upper limbs: Secondary | ICD-10-CM | POA: Diagnosis not present

## 2020-01-20 DIAGNOSIS — F0390 Unspecified dementia without behavioral disturbance: Secondary | ICD-10-CM | POA: Diagnosis not present

## 2020-01-20 DIAGNOSIS — G9341 Metabolic encephalopathy: Secondary | ICD-10-CM | POA: Diagnosis not present

## 2020-01-20 DIAGNOSIS — N400 Enlarged prostate without lower urinary tract symptoms: Secondary | ICD-10-CM | POA: Diagnosis not present

## 2020-01-20 DIAGNOSIS — R7303 Prediabetes: Secondary | ICD-10-CM | POA: Diagnosis not present

## 2020-01-20 DIAGNOSIS — M5126 Other intervertebral disc displacement, lumbar region: Secondary | ICD-10-CM | POA: Diagnosis not present

## 2020-02-05 DIAGNOSIS — L200 Besnier's prurigo: Secondary | ICD-10-CM | POA: Diagnosis not present

## 2020-02-12 ENCOUNTER — Ambulatory Visit (INDEPENDENT_AMBULATORY_CARE_PROVIDER_SITE_OTHER): Payer: Medicare Other | Admitting: *Deleted

## 2020-02-12 DIAGNOSIS — R55 Syncope and collapse: Secondary | ICD-10-CM | POA: Diagnosis not present

## 2020-02-13 LAB — CUP PACEART REMOTE DEVICE CHECK
Date Time Interrogation Session: 20210706023643
Implantable Pulse Generator Implant Date: 20180208

## 2020-02-13 NOTE — Progress Notes (Signed)
Carelink Summary Report / Loop Recorder 

## 2020-03-13 ENCOUNTER — Ambulatory Visit (INDEPENDENT_AMBULATORY_CARE_PROVIDER_SITE_OTHER): Payer: Medicare Other | Admitting: *Deleted

## 2020-03-13 DIAGNOSIS — R55 Syncope and collapse: Secondary | ICD-10-CM

## 2020-03-17 LAB — CUP PACEART REMOTE DEVICE CHECK
Date Time Interrogation Session: 20210808023639
Implantable Pulse Generator Implant Date: 20180208

## 2020-03-17 NOTE — Progress Notes (Signed)
Carelink Summary Report / Loop Recorder 

## 2020-04-15 ENCOUNTER — Ambulatory Visit (INDEPENDENT_AMBULATORY_CARE_PROVIDER_SITE_OTHER): Payer: Medicare Other | Admitting: *Deleted

## 2020-04-15 DIAGNOSIS — R002 Palpitations: Secondary | ICD-10-CM | POA: Diagnosis not present

## 2020-04-18 ENCOUNTER — Encounter: Payer: Self-pay | Admitting: Family Medicine

## 2020-04-18 LAB — CUP PACEART REMOTE DEVICE CHECK
Date Time Interrogation Session: 20210910023921
Implantable Pulse Generator Implant Date: 20180208

## 2020-04-18 NOTE — Progress Notes (Signed)
Carelink Summary Report / Loop Recorder 

## 2020-05-12 DIAGNOSIS — H5213 Myopia, bilateral: Secondary | ICD-10-CM | POA: Diagnosis not present

## 2020-05-15 ENCOUNTER — Ambulatory Visit: Payer: Medicare Other

## 2020-05-19 ENCOUNTER — Ambulatory Visit: Payer: Medicare Other | Admitting: Family Medicine

## 2020-05-21 LAB — CUP PACEART REMOTE DEVICE CHECK
Date Time Interrogation Session: 20211013024044
Implantable Pulse Generator Implant Date: 20180208

## 2020-05-26 ENCOUNTER — Encounter: Payer: Self-pay | Admitting: Family Medicine

## 2020-06-09 ENCOUNTER — Telehealth: Payer: Self-pay

## 2020-06-09 NOTE — Telephone Encounter (Signed)
Carelink alert received- Device at RRT  LVM for pt daughter Jeannene Patella to advise of status.  Carelink/ Paceart updated, future scheduled checks cancelled. Sent return kit to address of record.  Mychart message sent as well.

## 2020-07-28 DIAGNOSIS — E782 Mixed hyperlipidemia: Secondary | ICD-10-CM | POA: Diagnosis not present

## 2020-07-28 DIAGNOSIS — I10 Essential (primary) hypertension: Secondary | ICD-10-CM | POA: Diagnosis not present

## 2020-07-28 DIAGNOSIS — R7303 Prediabetes: Secondary | ICD-10-CM | POA: Diagnosis not present

## 2020-07-28 DIAGNOSIS — I6930 Unspecified sequelae of cerebral infarction: Secondary | ICD-10-CM | POA: Diagnosis not present

## 2020-11-12 ENCOUNTER — Encounter: Payer: Self-pay | Admitting: Family Medicine

## 2020-11-12 MED ORDER — DIVALPROEX SODIUM ER 500 MG PO TB24: ORAL_TABLET | ORAL | 0 refills | Status: DC

## 2020-11-12 MED ORDER — DIVALPROEX SODIUM ER 250 MG PO TB24: ORAL_TABLET | ORAL | 0 refills | Status: DC

## 2020-12-26 DIAGNOSIS — M25751 Osteophyte, right hip: Secondary | ICD-10-CM | POA: Diagnosis not present

## 2020-12-26 DIAGNOSIS — M898X8 Other specified disorders of bone, other site: Secondary | ICD-10-CM | POA: Diagnosis not present

## 2020-12-26 DIAGNOSIS — M1611 Unilateral primary osteoarthritis, right hip: Secondary | ICD-10-CM | POA: Diagnosis not present

## 2021-01-16 DIAGNOSIS — M25551 Pain in right hip: Secondary | ICD-10-CM | POA: Diagnosis not present

## 2021-01-30 DIAGNOSIS — I10 Essential (primary) hypertension: Secondary | ICD-10-CM | POA: Diagnosis not present

## 2021-01-30 DIAGNOSIS — R152 Fecal urgency: Secondary | ICD-10-CM | POA: Diagnosis not present

## 2021-01-30 DIAGNOSIS — R7303 Prediabetes: Secondary | ICD-10-CM | POA: Diagnosis not present

## 2021-01-30 DIAGNOSIS — E782 Mixed hyperlipidemia: Secondary | ICD-10-CM | POA: Diagnosis not present

## 2021-02-03 NOTE — Progress Notes (Addendum)
PATIENT: Nicholas Gaines DOB: 03/25/1929  REASON FOR VISIT: follow up HISTORY FROM: patient  Chief Complaint  Patient presents with   Follow-up    Rm 1, w/ daughter Nicholas Quick. Here for sz f/u, 2 mild breakthrough sz, reports no falls, doing well overall today.       HISTORY OF PRESENT ILLNESS: 02/05/2021 ALL:  [place] returns for follow up for seizures. He continues divalproex  BID.   We had discussed concerns of left hand tremor, imbalance, lower extremity weakness, fatigue, weight loss with decreased appetite and family history of PD (mother) at last visit. We discussed trial of Sinemet, however, he was hesitant. Concerns for depression were also considered. He reports that he is doing much better over the past year. He has been working with orthopedics for management of shoulder and hip pain. Pain is controlled with Tylenol and occasional Advil. He is not very active due to gait difficulty but does enjoy sitting outside. He has a stationary peddle machine but doesn't use it much. He is bright and much more interactive in the office, today. His daughter feels he is doing better. He has had two short episodes of garbled speech over the past year. This is typical for complex seizures. He is aware of what he wants to say but can't get the words out. No other red flags for CVA/TIA. He is tolerating divalproex. He is seen biannually for labs with PCP. He does not want to change AED regimen.  11/15/2019 ALL:  Nicholas Gaines is a 87 y.o. male here today for follow up. He was admitted to the hospital in 07/2019 for generalized weakness. Note report this was multifactorial but noted dehydration. He had a sore throat in the weeks prior that may have decreased fluid intake. He was given fluids and started on home PT. He completed therapy but did not feel that it helped much at all. He is able to walk outside with his walker. He did have a fall last week. He had walked out to his mailbox and his legs felt  weak. He reports getting off balance and fell. No injuries. He sleeps off and on throughout the day. Family plans to get an in home caregiver and put in security cameras. Daughter reports that he probably doesn't eat and drink like he should. He eats two decent meals each day. Sometimes three. He drinks about 2-32oz Gatorades or water every day. No trouble swallowing. Weight is stable. He has been vaccinated. Daughter reports a short period of time following the second vaccination where he was unable to talk. He was having a difficult time getting words out for about 1-2 hours about 5 hours after getting vaccine. She was very tired as well. He remembers the event but his daughter states that this is his typical response during a seizure. She feels that symptoms were very mild in comparison to his typical seizure. He continues divalproex  daily. He does not usually miss doses. Daughter calls to remind him to take medications but he is able to get them out of his pill organizer. He does continue to note a tremor of the left hand. It waxes and wanes.   We have discussed considering Sinemet for possible parkinson's disease based on symptoms and mother having PD. He is very hesitant to add medications as he had significant side effects with previous antiepileptics. He does not feel DaT scan is necessary. He does endorse some mild depression as well but does not feel it is bad  enough to consider a medication for treatment. He feels that he is able to rely on his faith when he is anxious or depressed.   HISTORY: (copied from my note on 06/04/2019)  Nicholas Gaines is a 87 y.o. male here today for follow up for seizure, MCI and history of CVA. He continues Depakote 750mg  daily. He has not had any seizure like activity since last being seen. His daughter, Nicholas Gaines, reports that he is slower and has had some weight fluctuations. He has lost about 8 pounds over the past year. He feels that he is not eating as much. He  cooks breakfast. Usually eats a frozen dinner or sandwich for lunch and dinner. No trouble swallowing or speaking. Gait is shuffled. He leans forward when walking. No assistive device. No falls. No dizziness. He is sleeping more. He feels more tired than normal but tries to stay active. He has noticed more of a tremor but is uncertain if it is worse with activity. He has contributed this to Depakote side effect.  He lives alone. He does not drive. He goes to church with friends and neighbors. He tries to stay busy. He continues aspirin 325mg  and Plavix. Mother died with Parkinson's.      HISTORY: (copied from  note on 05/29/2018)   Interval history 05/29/2018: He had formal neurocognitive testing with Dr. Alinda DoomsBailar and diagnosed Mild Cognitive Impairment. He feels well on the Depakote. He has daytime sleepiness and loose stools with the Depakote. He is on 750mg  a day. He has had 2 seizures in the last 6 months, they do not want to increase his Depakote. He does not drive. Discussed mood, he may have some adjustment disorder. His mood is "liveable" but he doesn't feel depressed or that he needs some help with mood.Here with his daughter, he is doing well, Depakote has some side effects but has been the best AED we have tried will continue with this. He goes to church. Discussed formal neurocognitive testing: MCI.   Reviewed imaging with patient/daughter. I agree with below :   IMPRESSION: This MRI of the brain without contrast shows the following: 1.    Moderate generalized cortical atrophy, more pronounced in the mesial temporal lobes.  The extent of atrophy has progressed noticeably since the 06/22/2016 MRI. 2.    Moderate chronic microvascular ischemic change, slightly progressed when compared to the 2017 MRI. 3.    There are no acute findings.   IMPRESSION: This MRI of the cervical spine without contrast shows the following: 1.    The spinal cord appears normal.   There is no spinal stenosis 2.    At  C2-C3, there is severe loss of disc height and uncovertebral spurring causing moderately severe bilateral foraminal narrowing that could lead to C3 nerve root compression to either side. 3.    At C3-C4, there are degenerative changes causing moderately severe left foraminal narrowing with potential for left C4 nerve root compression. 4.    At C5-C6, there are degenerative changes and severe reduced disc height causing moderately severe right foraminal narrowing with potential for right C6 nerve root compression. 5.    There are moderate degenerative changes at the other cervical levels with less potential for nerve root compression   Interval history 11/16/2017:  Patient had diarrhea to Depakote and he was changed to 500mg  ER daily.  He is here with his daughter. Diarrhea went away. Doing well on the extended release. He has had 2 breakthrough seizures. He has been  having more good days. Some days he feels like running, he is working in the garden. His speech is a lot better. Has had 2 breakthrough seizures so will increase to 750mg  day. He feels his walking is better. No falls. He has been to physical therapy or balance. He has arthritis in his hips and he drags his feet. No neck pain, he has disk in his back that gives him problems. But he still has imbalance. He still feels he has problems with memory. Daughter provides information. Daughter says he is doing better.    Interval history 02/16/2018: They are on vimpat twice a day 100mg . Having side effects from Vimpat. His balance is not as good as it was. He was fatigued with the Keppra but he has a decline with vimpat, reaction time is very slow, remembering how to work a cell phone. Daughter has to use manage his pills and his he has word-finding difficulty. They are concerned about quality of life. Decline din the last 12 months since the seizures. He fell three times when on Vimpat. He is not driving. He lives by himself bit he has first alert. Daughter  lives 10 minutes away. His mother had dementia she passed away at 75. He has more short-term memory loss. Discussed risks of coming off of medication, seizures can cause significant morbidity and mortality. They still decline any more medications at this time. He has difficulty remembering names and words. We can take him off of the medication and then review again. Denies depression. Will decrease medication and then have him come back in 8 weeks for dementia evaluation. No appetite.    Reviewed CT scan of the head and agree with the following: FINDINGS: Brain: There is generalized age related parenchymal atrophy with commensurate dilatation of the ventricles and sulci. Chronic small vessel ischemic changes noted within the periventricular and subcortical white matter regions bilaterally. Additional small old lacunar infarcts noted within each basal ganglia region.   There is no mass, hemorrhage, edema or other evidence of acute parenchymal abnormality. No extra-axial hemorrhage.   Vascular: There are chronic calcified atherosclerotic changes of the large vessels at the skull base. No unexpected hyperdense vessel.   Skull: Normal. Negative for fracture or focal lesion.   Sinuses/Orbits: No acute finding.   Other: None.   IMPRESSION: 1. No acute findings.  No intracranial mass, hemorrhage or edema. 2. Atrophy and chronic ischemic changes as detailed above.   Cbc with anemia, Cmp with slightly dec GFR but largely unremarkable     HPI:  Nicholas Gaines is a 87 y.o. male here as a referral from Dr. Lucendia Gaines for TIA vs seizures. Past medical history of atrial fibrillation, heart murmur, stroke in 2002, esophageal stricture, hyperlipidemia, TIA, prediabetic. He is on aspirin 325 and Plavix 75. Patient has had several episodes of staring, confusion, disorientation and unintelligible speech. Usually back to baseline within a half hour. No distinct recollection of the events and only slightly before and  after them. No weakness or numbness or incoordination or gait disturbances. No headaches. Episodes happened when he was feeling well but also in the setting of fevers and cough and respiratory infection. The first episode was in November, he was fishing and he tried to talk and couldn't get his words out, lasted 15 minutes, he could talk when he got home. Daughter is here and provides much information, she saw him later that evening and it was fine. He denies any other associated symptoms such as weakness or  confusion or vision changes. His friend in the car noticed that there was something wrong, no LOC, no urination or abnormal movements, he was in his usual state of health at the time no new medications or anything new but he was starting to feel ill at the time. Next episode was on Christmas eve in the setting of illness, daughter was calling him and he did not answer and he was sitting in the car with his wet clothes on from vomiting and he was asleep in the car, he was weak, he was running a fever of 102 and he was not taken to the hospital. On Christmas day he didn't recognize family, he was speaking in "word salad" and he was aimlessly wandering around the house and looking into space and not saying anything, couldn;t tell his daughter's name. No events since being discharged from the hospital. No current focal neurologic deficits, other associated symptoms or modifiable factors. He has not had any more incidents since starting AEDs, was started on trileptal in the hospital.    Reviewed notes, labs and imaging from outside physicians, which showed:    LDL 69, BUN 19, creatinine 1.1, A1c 5.6 08/03/2016. TSH 1.9 11/14/2015.   Patient presented to the emergency room on 07/03/2016 with fever, cough and episodic confusion. He said 2 episodes in the last several days where he had become discretely confused. Patient had a TIA in November diagnosed by primary care. He had been having on and off fevers,  shortness of breath, cough and was recently started on antibiotics. Patient was unable to speak during his TIA, he remembered trying to speak and not being able to. He had a outpatient workup for TIA with his PCP that showed MRI with old stroke in 2002 and is getting loose recorder for paroxysmal A. fib. Episodes of confusion were described as disorientation and staring with associated difficulty speaking. Episodes lasted 30-60 minutes then resolved spontaneously. Patient did not have any weakness, numbness, vision or hearing loss. Episodes of happened in the setting of temperature and cough but also happen when patient was feeling well. On admission he was found to have elevated white blood cells 13, CT of the head was negative for acute intracranial abnormalities and neurology was consulted. Neurology felt that these were suggestive of complex partial seizures and not TIAs. Routine EEG was recommended for outpatient 72 hour ambulatory EEG.   Personally reviewed images and agree with the following: MRI brain 06/22/2016: 1. No acute intracranial abnormality. 2. Mild chronic microvascular ischemic changes and parenchymal volume loss for age. Few scattered punctate foci of chronic microhemorrhage are nonspecific but also likely related to microvascular ischemic changes.   REVIEW OF SYSTEMS: Out of a complete 14 system review of symptoms, the patient complains only of the following symptoms, tremor, imbalance, depression, fatigue and all other reviewed systems are negative.  ALLERGIES: Allergies  Allergen Reactions   Rocephin [Ceftriaxone Sodium In Dextrose] Other (See Comments)    C-Diff   Atorvastatin     Other reaction(s): myalgia   Pravastatin     Other reaction(s): myalgia   Rosuvastatin Calcium     Other reaction(s): fatigued    HOME MEDICATIONS: Outpatient Medications Prior to Visit  Medication Sig Dispense Refill   acetaminophen (TYLENOL 8 HOUR) 650 MG CR tablet Take 1,400 mg by mouth 2  (two) times daily.     amLODipine (NORVASC) 10 MG tablet Take 1 tablet (10 mg total) by mouth daily. 30 tablet 0   amLODipine (NORVASC)  5 MG tablet Take 5 mg by mouth daily.     aspirin EC 325 MG tablet Take 1 tablet (325 mg total) by mouth daily. 30 tablet 0   Cholecalciferol 25 MCG (1000 UT) capsule Take 1 capsule (1,000 Units total) by mouth daily. 30 capsule 0   clopidogrel (PLAVIX) 75 MG tablet Take 1 tablet (75 mg total) by mouth daily. 30 tablet 0   famotidine (PEPCID) 20 MG tablet Take 1 tablet (20 mg total) by mouth daily. 30 tablet 0   ferrous sulfate 325 (65 FE) MG EC tablet Take 1 tablet (325 mg total) by mouth daily with breakfast. 30 tablet 0   Glucosamine-Chondroit-Vit C-Mn (GLUCOSAMINE CHONDR 1500 COMPLX PO) Take 1 tablet by mouth 2 (two) times daily.     Multiple Vitamins-Minerals (ICAPS) CAPS Take 1 capsule by mouth daily.     Omega-3 Fatty Acids (FISH OIL) 1000 MG CAPS Take 2,000 mg by mouth daily.     vitamin B-12 (CYANOCOBALAMIN) 1000 MCG tablet Take 1 tablet (1,000 mcg total) by mouth daily. 30 tablet 0   ZETIA 10 MG tablet Take 1 tablet (10 mg total) by mouth daily. 30 tablet 0   acetaminophen (TYLENOL) 500 MG tablet Take 1,000 mg by mouth in the morning and at bedtime.     divalproex (DEPAKOTE ER) 250 MG 24 hr tablet TAKE 1 TABLET BY MOUTH EVERY DAY WITH  TABLET FOR A TOTAL OF  90 tablet 0   divalproex (DEPAKOTE ER) 500 MG 24 hr tablet TAKE 1 TABLET BY MOUTH EVERY DAY WITH  TABLET FOR TOTAL OF  90 tablet 0   No facility-administered medications prior to visit.    PAST MEDICAL HISTORY: Past Medical History:  Diagnosis Date   A-fib [facility])    Arthritis    GERD (gastroesophageal reflux disease)    Heart murmur    HLD (hyperlipidemia)    Seizures (HCC)    last sz 04/17/17   Stroke (HCC)    tia    PAST SURGICAL HISTORY: Past Surgical History:  Procedure Laterality Date   CARPAL TUNNEL RELEASE     About 10 yr ago (08/13/16)   LOOP RECORDER  INSERTION N/A 09/16/2016   Procedure: Loop Recorder Insertion;  Surgeon: Hillis Range, MD;  Location: [facility] INVASIVE CV LAB;  Service: Cardiovascular;  Laterality: N/A;   TONSILLECTOMY      FAMILY HISTORY: Family History  Problem Relation Age of Onset   Hypertension Mother    Parkinson's disease Mother    Heart attack Mother    Stroke Sister    Lymphoma Sister    Stroke Brother    CVA Father    Stroke Paternal Grandmother        in her early 79's   Stroke Paternal Grandfather        in his early 42's   Pulmonary embolism Sister        04/2018   Diabetes Neg Hx     SOCIAL HISTORY: Social History   Socioeconomic History   Marital status: Widowed    Spouse name: Not on file   Number of children: 1   Years of education: 40   Highest education level: Not on file  Occupational History   Occupation: Retired  Tobacco Use   Smoking status: Never   Smokeless tobacco: Never  Vaping Use   Vaping Use: Never used  Substance and Sexual Activity   Alcohol use: No    Alcohol/week: 0.0 standard drinks   Drug use: No  Sexual activity: Not on file  Other Topics Concern   Not on file  Social History Narrative   04/19/17 Lives alone. Uses cane/walker for ambulation. Caffeine use: cup of coffee daily   Right handed   Social Determinants of Health   Financial Resource Strain: Not on file  Food Insecurity: Not on file  Transportation Needs: Not on file  Physical Activity: Not on file  Stress: Not on file  Social Connections: Not on file  Intimate Partner Violence: Not on file      PHYSICAL EXAM  Vitals:   02/05/21 1000  BP: 118/73  Pulse: 88  Weight: 144 lb 9.6 oz (65.6 kg)  Height: 5\' 6"  (1.676 m)    Body mass index is 23.34 kg/m.  Generalized: Well developed, in no acute distress  Cardiology: normal rate and rhythm, holosystolic murmur  Neurological examination  Mentation: Alert oriented to time, place, history taking. Follows all commands speech and language  fluent Cranial nerve II-XII: Pupils were equal round reactive to light. Extraocular movements were full, visual field were full on confrontational test. Head turning and shoulder shrug  were normal and symmetric. Motor: The motor testing reveals 5 over 5 strength of all 4 extremities. Good symmetric motor tone is noted throughout.  Gait and station: able to stand from seated position using arms to push up from chair, gait is narrow and slow but stable with walker. Stooped posture. Unable to tandem.   DIAGNOSTIC DATA (LABS, IMAGING, TESTING) - I reviewed patient records, labs, notes, testing and imaging myself where available.  MMSE - Mini Mental State Exam 06/04/2019 11/16/2017  Orientation to time 5 5  Orientation to Place 5 3  Registration 3 3  Attention/ Calculation 1 3  Recall 2 2  Language- name 2 objects 2 2  Language- repeat 0 1  Language- follow 3 step command 3 3  Language- read & follow direction 1 1  Write a sentence 1 1  Copy design 0 0  Copy design-comments Named 10 animals -  Total score 23 24     Lab Results  Component Value Date   WBC 6.4 12/03/2019   HGB 10.0 (L) 12/03/2019   HCT 30.4 (L) 12/03/2019   MCV 96.5 12/03/2019   PLT 196 12/03/2019      Component Value Date/Time   NA 142 12/03/2019 0429   NA 147 (H) 06/04/2019 1458   K 3.6 12/03/2019 0429   CL 106 12/03/2019 0429   CO2 28 12/03/2019 0429   GLUCOSE 100 (H) 12/03/2019 0429   BUN 17 12/03/2019 0429   BUN 21 06/04/2019 1458   CREATININE 1.18 12/03/2019 0429   CALCIUM 8.7 (L) 12/03/2019 0429   PROT 5.7 (L) 11/28/2019 0335   PROT 6.5 06/04/2019 1458   ALBUMIN 3.2 (L) 11/28/2019 0335   ALBUMIN 4.4 06/04/2019 1458   AST 56 (H) 11/28/2019 0335   ALT 18 11/28/2019 0335   ALKPHOS 77 11/28/2019 0335   BILITOT 0.7 11/28/2019 0335   BILITOT <0.2 06/04/2019 1458   GFRNONAA 54 (L) 12/03/2019 0429   GFRAA >60 12/03/2019 0429   Lab Results  Component Value Date   CHOL 120 08/03/2016   HDL 37 (L)  08/03/2016   LDLCALC 69 08/03/2016   TRIG 71 08/03/2016   CHOLHDL 3.2 08/03/2016   Lab Results  Component Value Date   HGBA1C 5.6 08/03/2016   Lab Results  Component Value Date   VITAMINB12 2,819 (H) 11/27/2019   Lab Results  Component Value Date  TSH 4.094 11/27/2019     ASSESSMENT AND PLAN 87 y.o. year old male  has a past medical history of A-fib (HCC), Arthritis, GERD (gastroesophageal reflux disease), Heart murmur, HLD (hyperlipidemia), Seizures (HCC), and Stroke (HCC). here with     ICD-10-CM   1. Seizures (HCC)  R56.9     2. Family history of Parkinson disease  Z82.0     3. Tremor  R25.1        Nicholas Gaines is doing well, today. He is much more interactive with me in comparison to last year's visit. He is tolerating divalproex ER 750mg  at bedtime. He has had 2 "mild" episodes of garbled speech over the past year which is baseline. He does not want to adjust AED regimen but will let me know if events worsen. He was encouraged to continue focusing on healthy lifestyle habits. Adequate hydration encouraged.  He will follow up closely with PCP and cardiology as directed for management of co morbidities and stroke prevention. We will follow up in 1 year. He verbalizes understanding and agreement with this plan.    No orders of the defined types were placed in this encounter.    Meds ordered this encounter  Medications   divalproex (DEPAKOTE ER) 250 MG 24 hr tablet    Sig: TAKE 1 TABLET BY MOUTH EVERY DAY WITH 500MG  TABLET FOR A TOTAL OF 750MG     Dispense:  90 tablet    Refill:  3    Order Specific Question:   Supervising Provider    Answer:      divalproex (DEPAKOTE ER) 500 MG 24 hr tablet    Sig: TAKE 1 TABLET BY MOUTH EVERY DAY WITH 250MG  TABLET FOR TOTAL OF 750MG     Dispense:  90 tablet    Refill:  3    Order Specific Question:   Supervising Provider    Answer:   Anson Fret, FNP-C 02/05/2021, 10:46  AM [facility] [address], [address] [place], Anson Fret [id number] [phone]   agree with assessment and plan as stated.     [address], MD [facility]

## 2021-02-03 NOTE — Patient Instructions (Signed)
Below is our plan:  We will continue divalproex 750mg  every night. Please continue to stay well hydrated and follow up regularly with PCP.   Please make sure you are staying well hydrated. I recommend 50-60 ounces daily. Well balanced diet and regular exercise encouraged. Consistent sleep schedule with 6-8 hours recommended.   Please continue follow up with care team as directed.   Follow up with me in year   You may receive a survey regarding today's visit. I encourage you to leave honest feed back as I do use this information to improve patient care. Thank you for seeing me today!

## 2021-02-05 ENCOUNTER — Encounter: Payer: Self-pay | Admitting: Family Medicine

## 2021-02-05 ENCOUNTER — Ambulatory Visit: Payer: Medicare Other | Admitting: Family Medicine

## 2021-02-05 VITALS — BP 118/73 | HR 88 | Ht 66.0 in | Wt 144.6 lb

## 2021-02-05 DIAGNOSIS — R251 Tremor, unspecified: Secondary | ICD-10-CM | POA: Diagnosis not present

## 2021-02-05 DIAGNOSIS — R7303 Prediabetes: Secondary | ICD-10-CM | POA: Diagnosis not present

## 2021-02-05 DIAGNOSIS — M1A0711 Idiopathic chronic gout, right ankle and foot, with tophus (tophi): Secondary | ICD-10-CM | POA: Diagnosis not present

## 2021-02-05 DIAGNOSIS — Z820 Family history of epilepsy and other diseases of the nervous system: Secondary | ICD-10-CM | POA: Diagnosis not present

## 2021-02-05 DIAGNOSIS — R569 Unspecified convulsions: Secondary | ICD-10-CM

## 2021-02-05 DIAGNOSIS — E782 Mixed hyperlipidemia: Secondary | ICD-10-CM | POA: Diagnosis not present

## 2021-02-05 DIAGNOSIS — D649 Anemia, unspecified: Secondary | ICD-10-CM | POA: Diagnosis not present

## 2021-02-05 MED ORDER — DIVALPROEX SODIUM ER 250 MG PO TB24: ORAL_TABLET | ORAL | 3 refills | Status: AC

## 2021-02-05 MED ORDER — DIVALPROEX SODIUM ER 500 MG PO TB24: ORAL_TABLET | ORAL | 3 refills | Status: AC

## 2021-03-30 DIAGNOSIS — S8011XA Contusion of right lower leg, initial encounter: Secondary | ICD-10-CM | POA: Diagnosis not present

## 2021-03-30 DIAGNOSIS — N50819 Testicular pain, unspecified: Secondary | ICD-10-CM | POA: Diagnosis not present

## 2021-05-07 DIAGNOSIS — N341 Nonspecific urethritis: Secondary | ICD-10-CM | POA: Diagnosis not present

## 2021-05-07 DIAGNOSIS — N50811 Right testicular pain: Secondary | ICD-10-CM | POA: Diagnosis not present

## 2021-05-07 DIAGNOSIS — N50812 Left testicular pain: Secondary | ICD-10-CM | POA: Diagnosis not present

## 2021-05-27 DIAGNOSIS — M1611 Unilateral primary osteoarthritis, right hip: Secondary | ICD-10-CM | POA: Diagnosis not present

## 2021-06-15 DIAGNOSIS — M1611 Unilateral primary osteoarthritis, right hip: Secondary | ICD-10-CM | POA: Diagnosis not present

## 2021-08-25 DIAGNOSIS — R7303 Prediabetes: Secondary | ICD-10-CM | POA: Diagnosis not present

## 2021-08-25 DIAGNOSIS — I10 Essential (primary) hypertension: Secondary | ICD-10-CM | POA: Diagnosis not present

## 2021-08-25 DIAGNOSIS — Z0000 Encounter for general adult medical examination without abnormal findings: Secondary | ICD-10-CM | POA: Diagnosis not present

## 2021-08-25 DIAGNOSIS — E559 Vitamin D deficiency, unspecified: Secondary | ICD-10-CM | POA: Diagnosis not present

## 2021-08-25 DIAGNOSIS — E782 Mixed hyperlipidemia: Secondary | ICD-10-CM | POA: Diagnosis not present

## 2021-08-25 DIAGNOSIS — D649 Anemia, unspecified: Secondary | ICD-10-CM | POA: Diagnosis not present

## 2021-08-25 DIAGNOSIS — L89512 Pressure ulcer of right ankle, stage 2: Secondary | ICD-10-CM | POA: Diagnosis not present

## 2021-08-28 DIAGNOSIS — M1611 Unilateral primary osteoarthritis, right hip: Secondary | ICD-10-CM | POA: Diagnosis not present

## 2021-08-31 DIAGNOSIS — M1A0711 Idiopathic chronic gout, right ankle and foot, with tophus (tophi): Secondary | ICD-10-CM | POA: Diagnosis not present

## 2021-08-31 DIAGNOSIS — I10 Essential (primary) hypertension: Secondary | ICD-10-CM | POA: Diagnosis not present

## 2021-08-31 DIAGNOSIS — E7800 Pure hypercholesterolemia, unspecified: Secondary | ICD-10-CM | POA: Diagnosis not present

## 2021-08-31 DIAGNOSIS — K219 Gastro-esophageal reflux disease without esophagitis: Secondary | ICD-10-CM | POA: Diagnosis not present

## 2021-08-31 DIAGNOSIS — M5126 Other intervertebral disc displacement, lumbar region: Secondary | ICD-10-CM | POA: Diagnosis not present

## 2021-08-31 DIAGNOSIS — N400 Enlarged prostate without lower urinary tract symptoms: Secondary | ICD-10-CM | POA: Diagnosis not present

## 2021-08-31 DIAGNOSIS — Z9181 History of falling: Secondary | ICD-10-CM | POA: Diagnosis not present

## 2021-08-31 DIAGNOSIS — G319 Degenerative disease of nervous system, unspecified: Secondary | ICD-10-CM | POA: Diagnosis not present

## 2021-08-31 DIAGNOSIS — Z8673 Personal history of transient ischemic attack (TIA), and cerebral infarction without residual deficits: Secondary | ICD-10-CM | POA: Diagnosis not present

## 2021-08-31 DIAGNOSIS — L89510 Pressure ulcer of right ankle, unstageable: Secondary | ICD-10-CM | POA: Diagnosis not present

## 2021-08-31 DIAGNOSIS — I358 Other nonrheumatic aortic valve disorders: Secondary | ICD-10-CM | POA: Diagnosis not present

## 2021-08-31 DIAGNOSIS — I69392 Facial weakness following cerebral infarction: Secondary | ICD-10-CM | POA: Diagnosis not present

## 2021-08-31 DIAGNOSIS — R7303 Prediabetes: Secondary | ICD-10-CM | POA: Diagnosis not present

## 2021-08-31 DIAGNOSIS — E559 Vitamin D deficiency, unspecified: Secondary | ICD-10-CM | POA: Diagnosis not present

## 2021-08-31 DIAGNOSIS — Z7902 Long term (current) use of antithrombotics/antiplatelets: Secondary | ICD-10-CM | POA: Diagnosis not present

## 2021-08-31 DIAGNOSIS — D509 Iron deficiency anemia, unspecified: Secondary | ICD-10-CM | POA: Diagnosis not present

## 2021-09-01 DIAGNOSIS — U071 COVID-19: Secondary | ICD-10-CM | POA: Diagnosis not present

## 2021-09-24 DIAGNOSIS — D649 Anemia, unspecified: Secondary | ICD-10-CM | POA: Diagnosis not present

## 2021-09-30 DIAGNOSIS — E7800 Pure hypercholesterolemia, unspecified: Secondary | ICD-10-CM | POA: Diagnosis not present

## 2021-09-30 DIAGNOSIS — M1A0711 Idiopathic chronic gout, right ankle and foot, with tophus (tophi): Secondary | ICD-10-CM | POA: Diagnosis not present

## 2021-09-30 DIAGNOSIS — Z8673 Personal history of transient ischemic attack (TIA), and cerebral infarction without residual deficits: Secondary | ICD-10-CM | POA: Diagnosis not present

## 2021-09-30 DIAGNOSIS — N400 Enlarged prostate without lower urinary tract symptoms: Secondary | ICD-10-CM | POA: Diagnosis not present

## 2021-09-30 DIAGNOSIS — K219 Gastro-esophageal reflux disease without esophagitis: Secondary | ICD-10-CM | POA: Diagnosis not present

## 2021-09-30 DIAGNOSIS — Z7902 Long term (current) use of antithrombotics/antiplatelets: Secondary | ICD-10-CM | POA: Diagnosis not present

## 2021-09-30 DIAGNOSIS — I10 Essential (primary) hypertension: Secondary | ICD-10-CM | POA: Diagnosis not present

## 2021-09-30 DIAGNOSIS — Z9181 History of falling: Secondary | ICD-10-CM | POA: Diagnosis not present

## 2021-09-30 DIAGNOSIS — L89510 Pressure ulcer of right ankle, unstageable: Secondary | ICD-10-CM | POA: Diagnosis not present

## 2021-09-30 DIAGNOSIS — D509 Iron deficiency anemia, unspecified: Secondary | ICD-10-CM | POA: Diagnosis not present

## 2021-10-30 ENCOUNTER — Other Ambulatory Visit: Payer: Self-pay

## 2021-10-30 ENCOUNTER — Encounter (HOSPITAL_BASED_OUTPATIENT_CLINIC_OR_DEPARTMENT_OTHER): Payer: Medicare Other | Attending: General Surgery | Admitting: General Surgery

## 2021-10-30 DIAGNOSIS — I358 Other nonrheumatic aortic valve disorders: Secondary | ICD-10-CM | POA: Diagnosis not present

## 2021-10-30 DIAGNOSIS — I69392 Facial weakness following cerebral infarction: Secondary | ICD-10-CM | POA: Diagnosis not present

## 2021-10-30 DIAGNOSIS — I639 Cerebral infarction, unspecified: Secondary | ICD-10-CM | POA: Diagnosis not present

## 2021-10-30 DIAGNOSIS — N400 Enlarged prostate without lower urinary tract symptoms: Secondary | ICD-10-CM | POA: Diagnosis not present

## 2021-10-30 DIAGNOSIS — L89510 Pressure ulcer of right ankle, unstageable: Secondary | ICD-10-CM | POA: Diagnosis not present

## 2021-10-30 DIAGNOSIS — I6930 Unspecified sequelae of cerebral infarction: Secondary | ICD-10-CM | POA: Insufficient documentation

## 2021-10-30 DIAGNOSIS — M1A0711 Idiopathic chronic gout, right ankle and foot, with tophus (tophi): Secondary | ICD-10-CM | POA: Diagnosis not present

## 2021-10-30 DIAGNOSIS — L97312 Non-pressure chronic ulcer of right ankle with fat layer exposed: Secondary | ICD-10-CM | POA: Diagnosis not present

## 2021-10-30 DIAGNOSIS — G319 Degenerative disease of nervous system, unspecified: Secondary | ICD-10-CM | POA: Diagnosis not present

## 2021-10-30 DIAGNOSIS — L97311 Non-pressure chronic ulcer of right ankle limited to breakdown of skin: Secondary | ICD-10-CM | POA: Insufficient documentation

## 2021-10-30 DIAGNOSIS — K219 Gastro-esophageal reflux disease without esophagitis: Secondary | ICD-10-CM | POA: Diagnosis not present

## 2021-10-30 DIAGNOSIS — D509 Iron deficiency anemia, unspecified: Secondary | ICD-10-CM | POA: Diagnosis not present

## 2021-10-30 DIAGNOSIS — Z8673 Personal history of transient ischemic attack (TIA), and cerebral infarction without residual deficits: Secondary | ICD-10-CM | POA: Diagnosis not present

## 2021-10-30 DIAGNOSIS — E7800 Pure hypercholesterolemia, unspecified: Secondary | ICD-10-CM | POA: Diagnosis not present

## 2021-10-30 DIAGNOSIS — Z7902 Long term (current) use of antithrombotics/antiplatelets: Secondary | ICD-10-CM | POA: Diagnosis not present

## 2021-10-30 DIAGNOSIS — Z9181 History of falling: Secondary | ICD-10-CM | POA: Diagnosis not present

## 2021-10-30 DIAGNOSIS — R7303 Prediabetes: Secondary | ICD-10-CM | POA: Insufficient documentation

## 2021-10-30 DIAGNOSIS — M5126 Other intervertebral disc displacement, lumbar region: Secondary | ICD-10-CM | POA: Diagnosis not present

## 2021-10-30 DIAGNOSIS — E559 Vitamin D deficiency, unspecified: Secondary | ICD-10-CM | POA: Diagnosis not present

## 2021-10-30 DIAGNOSIS — I10 Essential (primary) hypertension: Secondary | ICD-10-CM | POA: Diagnosis not present

## 2021-10-30 NOTE — Progress Notes (Signed)
HESTON, CASTELLA ([id number]) ?Visit Report for 10/30/2021 ?Allergy List Details ?Patient Name: Date of Service: ?Windy Fast 10/30/2021 9:00 A M ?Medical Record Number: [id number] ?Patient Account Number: [id number] ?Date of Birth/Sex: Treating RN: ?[birth year] (88 y.o. Ernestene Mention ?Primary Care Velita Quirk: Antony Contras Other Clinician: ?Referring Shamirah Ivan: ?Treating Myasia Sinatra/Extender: Fredirick Maudlin ?Antony Contras ?Weeks in Treatment: 0 ?Allergies ?Active Allergies ?Rocephin ?Reaction: dyspepsia ?Lipitor ?Reaction: myalgia ?pravastatin ?Reaction: myalgia ?Crestor ?Reaction: fatigued ?Allergy Notes ?Electronic Signature(s) ?Signed: 10/30/2021 4:25:50 PM By: Baruch Gouty RN, BSN ?Entered By: Baruch Gouty on 10/30/2021 09:25:40 ?-------------------------------------------------------------------------------- ?Arrival Information Details ?Patient Name: Date of Service: ?Windy Fast 10/30/2021 9:00 A M ?Medical Record Number: [id number] ?Patient Account Number: [id number] ?Date of Birth/Sex: Treating RN: ?Dec 21, 1928 (88 y.o. Ernestene Mention ?Primary Care Marylen Zuk: Antony Contras Other Clinician: ?Referring Oktober Glazer: ?Treating Haidyn Kilburg/Extender: Fredirick Maudlin ?Antony Contras ?Weeks in Treatment: 0 ?Visit Information ?Patient Arrived: Wheel Chair ?Arrival Time: 09:17 ?Accompanied By: daughter ?Transfer Assistance: Manual ?Patient Identification Verified: Yes ?Secondary Verification Process Completed: Yes ?Electronic Signature(s) ?Signed: 10/30/2021 11:00:44 AM By: Sandre Kitty ?Entered By: Sandre Kitty on 10/30/2021 09:18:08 ?-------------------------------------------------------------------------------- ?Clinic Level of Care Assessment Details ?Patient Name: Date of Service: ?Windy Fast 10/30/2021 9:00 A M ?Medical Record Number: [id number] ?Patient Account Number: [id number] ?Date of Birth/Sex: Treating RN: ?[birth year] (88 y.o. Ernestene Mention ?Primary Care Cornisha Zetino: Antony Contras Other  Clinician: ?Referring Keric Zehren: ?Treating Seraiah Nowack/Extender: Fredirick Maudlin ?Antony Contras ?Weeks in Treatment: 0 ?Clinic Level of Care Assessment Items ?TOOL 2 Quantity Score ?[]  - 0 ?Use when only an EandM is performed on the INITIAL visit ?ASSESSMENTS - Nursing Assessment / Reassessment ?X- 1 20 ?General Physical Exam (combine w/ comprehensive assessment (listed just below) when performed on new pt. evals) ?X- 1 25 ?Comprehensive Assessment (HX, ROS, Risk Assessments, Wounds Hx, etc.) ?ASSESSMENTS - Wound and Skin A ssessment / Reassessment ?X - Simple Wound Assessment / Reassessment - one wound 1 5 ?[]  - 0 ?Complex Wound Assessment / Reassessment - multiple wounds ?[]  - 0 ?Dermatologic / Skin Assessment (not related to wound area) ?ASSESSMENTS - Ostomy and/or Continence Assessment and Care ?[]  - 0 ?Incontinence Assessment and Management ?[]  - 0 ?Ostomy Care Assessment and Management (repouching, etc.) ?PROCESS - Coordination of Care ?X - Simple Patient / Family Education for ongoing care 1 15 ?[]  - 0 ?Complex (extensive) Patient / Family Education for ongoing care ?X- 1 10 ?Staff obtains Consents, Records, T Results / Process Orders ?est ?X- 1 10 ?Staff telephones HHA, Nursing Homes / Clarify orders / etc ?[]  - 0 ?Routine Transfer to another Facility (non-emergent condition) ?[]  - 0 ?Routine Hospital Admission (non-emergent condition) ?X- 1 15 ?New Admissions / Biomedical engineer / Ordering NPWT Apligraf, etc. ?, ?[]  - 0 ?Emergency Hospital Admission (emergent condition) ?X- 1 10 ?Simple Discharge Coordination ?[]  - 0 ?Complex (extensive) Discharge Coordination ?PROCESS - Special Needs ?[]  - 0 ?Pediatric / Minor Patient Management ?[]  - 0 ?Isolation Patient Management ?[]  - 0 ?Hearing / Language / Visual special needs ?[]  - 0 ?Assessment of Community assistance (transportation, D/C planning, etc.) ?[]  - 0 ?Additional assistance / Altered mentation ?[]  - 0 ?Support Surface(s) Assessment (bed, cushion,  seat, etc.) ?INTERVENTIONS - Wound Cleansing / Measurement ?X- 1 5 ?Wound Imaging (photographs - any number of wounds) ?[]  - 0 ?Wound Tracing (instead of photographs) ?X- 1 5 ?Simple Wound Measurement - one wound ?[]  - 0 ?Complex Wound Measurement - multiple wounds ?X- 1 5 ?Simple Wound Cleansing - one  wound ?[]  - 0 ?Complex Wound Cleansing - multiple wounds ?INTERVENTIONS - Wound Dressings ?X - Small Wound Dressing one or multiple wounds 1 10 ?[]  - 0 ?Medium Wound Dressing one or multiple wounds ?[]  - 0 ?Large Wound Dressing one or multiple wounds ?[]  - 0 ?Application of Medications - injection ?INTERVENTIONS - Miscellaneous ?[]  - 0 ?External ear exam ?[]  - 0 ?Specimen Collection (cultures, biopsies, blood, body fluids, etc.) ?[]  - 0 ?Specimen(s) / Culture(s) sent or taken to Lab for analysis ?[]  - 0 ?Patient Transfer (multiple staff / Civil Service fast streamer / Similar devices) ?[]  - 0 ?Simple Staple / Suture removal (25 or less) ?[]  - 0 ?Complex Staple / Suture removal (26 or more) ?[]  - 0 ?Hypo / Hyperglycemic Management (close monitor of Blood Glucose) ?X- 1 15 ?Ankle / Brachial Index (ABI) - do not check if billed separately ?Has the patient been seen at the hospital within the last three years: Yes ?Total Score: 150 ?Level Of Care: New/Established - Level 4 ?Electronic Signature(s) ?Signed: 10/30/2021 4:25:50 PM By: Baruch Gouty RN, BSN ?Entered By: Baruch Gouty on 10/30/2021 10:11:17 ?-------------------------------------------------------------------------------- ?Encounter Discharge Information Details ?Patient Name: Date of Service: ?Windy Fast 10/30/2021 9:00 A M ?Medical Record Number: [id number] ?Patient Account Number: [id number] ?Date of Birth/Sex: Treating RN: ?[birth year] (88 y.o. Ernestene Mention ?Primary Care Vaani Morren: Antony Contras Other Clinician: ?Referring Kingdom Vanzanten: ?Treating Ansen Sayegh/Extender: Fredirick Maudlin ?Antony Contras ?Weeks in Treatment: 0 ?Encounter Discharge Information Items ?Discharge  Condition: Stable ?Ambulatory Status: Wheelchair ?Discharge Destination: Home ?Transportation: Private Auto ?Accompanied By: daughter ?Schedule Follow-up Appointment: Yes ?Clinical Summary of Care: Patient Declined ?Electronic Signature(s) ?Signed: 10/30/2021 4:25:50 PM By: Baruch Gouty RN, BSN ?Entered By: Baruch Gouty on 10/30/2021 10:12:04 ?-------------------------------------------------------------------------------- ?Lower Extremity Assessment Details ?Patient Name: Date of Service: ?Windy Fast 10/30/2021 9:00 A M ?Medical Record Number: [id number] ?Patient Account Number: [id number] ?Date of Birth/Sex: ?Treating RN: ?[birth year] (88 y.o. Ernestene Mention ?Primary Care Catha Ontko: Antony Contras ?Other Clinician: ?Referring Chanique Duca: ?Treating Cassondra Stachowski/Extender: Fredirick Maudlin ?Antony Contras ?Weeks in Treatment: 0 ?Edema Assessment ?Assessed: [Left: No] [Right: No] ?Edema: [Left: Ye] [Right: s] ?Calf ?Left: Right: ?Point of Measurement: From Medial Instep 30 cm ?Ankle ?Left: Right: ?Point of Measurement: From Medial Instep 21.7 cm ?Vascular Assessment ?Pulses: ?Dorsalis Pedis ?Palpable: [Right:No] ?Notes ?right DP and PT pulse noncompressible ?Electronic Signature(s) ?Signed: 10/30/2021 4:25:50 PM By: Baruch Gouty RN, BSN ?Entered By: Baruch Gouty on 10/30/2021 09:44:28 ?-------------------------------------------------------------------------------- ?Multi Wound Chart Details ?Patient Name: ?Date of Service: ?Windy Fast 10/30/2021 9:00 A M ?Medical Record Number: [id number] ?Patient Account Number: [id number] ?Date of Birth/Sex: ?Treating RN: ?08/21/1928 (88 y.o. Ernestene Mention ?Primary Care Lolitha Tortora: Antony Contras ?Other Clinician: ?Referring Torryn Hudspeth: ?Treating Nike Southers/Extender: Fredirick Maudlin ?Antony Contras ?Weeks in Treatment: 0 ?Vital Signs ?Height(in): 65 ?Pulse(bpm): 76 ?Weight(lbs): 146 ?Blood Pressure(mmHg): 127/86 ?Body Mass Index(BMI): 24.3 ?Temperature(??F):  97.8 ?Respiratory Rate(breaths/min): 18 ?Photos: [N/A:N/A] ?Right, Lateral Malleolus N/A N/A ?Wound Location: ?Gradually Appeared N/A N/A ?Wounding Event: ?Venous Leg Ulcer N/A N/A ?Primary Etiology: ?Cataracts, Anemia

## 2021-10-30 NOTE — Progress Notes (Signed)
KOLBEY, TEICHERT ([id number]) ?Visit Report for 10/30/2021 ?Chief Complaint Document Details ?Patient Name: Date of Service: ?Efraim Kaufmann 10/30/2021 9:00 A M ?Medical Record Number: [id number] ?Patient Account Number: [id number] ?Date of Birth/Sex: Treating RN: ?July 13, 1929 (88 y.o. Damaris Schooner ?Primary Care Provider: [name] Other Clinician: ?Referring Provider: ?Treating Provider/Extender: Duanne Guess ?[name] ?Weeks in Treatment: 0 ?Information Obtained from: Patient ?Chief Complaint ?Patient seen for complaints of Non-Healing Wound. ?Electronic Signature(s) ?Signed: 10/30/2021 10:04:15 AM By: Duanne Guess MD FACS ?Entered By: Duanne Guess on 10/30/2021 10:04:15 ?-------------------------------------------------------------------------------- ?HPI Details ?Patient Name: Date of Service: ?Efraim Kaufmann 10/30/2021 9:00 A M ?Medical Record Number: [id number] ?Patient Account Number: [id number] ?Date of Birth/Sex: Treating RN: ?04/05/1929 (88 y.o. Damaris Schooner ?Primary Care Provider: [name] Other Clinician: ?Referring Provider: ?Treating Provider/Extender: Duanne Guess ?[name] ?Weeks in Treatment: 0 ?History of Present Illness ?HPI Description: ADMISSION ?10/30/2021 ?This is an 88 year old man who is here, accompanied by his daughter, for evaluation of a small ulcer on his right lateral malleolus. His daughter says that it ?first appeared around 1 January. He does have home health care that comes once a week. The home health nurses and his daughter have been managing this ?at home, applying medical honey to the site with an Allevyn foam dressing. The thought has been that this was a pressure ulcer from either his other foot or ?perhaps simply against the bed sheets. It is small and punched out. It is difficult to even discern on first glance if it is even open. She also purchased a foam ?wrap for his ankle to avoid further pressure while he is in bed. He is still  fairly active and ambulates independently in his home. His daughter showed me ?photographs of the initial appearance of the wound. There was more periwound erythema at that time, which has now resolved. He has not been on any ?antibiotics for this. ?His past medical history includes a stroke with some mild sequelae. He is not diabetic. He does not smoke. ?Electronic Signature(s) ?Signed: 10/30/2021 10:35:53 AM By: Duanne Guess MD FACS ?Previous Signature: 10/30/2021 10:08:05 AM Version By: Duanne Guess MD FACS ?Entered By: Duanne Guess on 10/30/2021 10:35:53 ?-------------------------------------------------------------------------------- ?Physical Exam Details ?Patient Name: Date of Service: ?Efraim Kaufmann 10/30/2021 9:00 A M ?Medical Record Number: [id number] ?Patient Account Number: [id number] ?Date of Birth/Sex: Treating RN: ?June 27, 1929 (88 y.o. Damaris Schooner ?Primary Care Provider: [name] Other Clinician: ?Referring Provider: ?Treating Provider/Extender: Duanne Guess ?[name] ?Weeks in Treatment: 0 ?Constitutional ?. . . . No acute distress. ?Respiratory ?Normal work of breathing on room air.Marland Kitchen ?Cardiovascular ?Unable to palpate distal pulses in the lower extremities.Marland Kitchen ?Notes ?10/30/2021: Wound examon his right lateral malleolus, there is a tiny punched out lesion. It is almost completely filled in with scar, aside from a pinpoint ?opening at the center base. There is no erythema, induration, or purulent drainage. No concern for infection. ?Electronic Signature(s) ?Signed: 10/30/2021 10:37:13 AM By: Duanne Guess MD FACS ?Entered By: Duanne Guess on 10/30/2021 10:37:13 ?-------------------------------------------------------------------------------- ?Physician Orders Details ?Patient Name: Date of Service: ?Efraim Kaufmann 10/30/2021 9:00 A M ?Medical Record Number: [id number] ?Patient Account Number: [id number] ?Date of Birth/Sex: Treating RN: ?Apr 21, 1929 (88 y.o. 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 - Dr. Lady Gary and Bonita Quin ***may call to cancel if wound is healed*** ?Return A ?Bathing/ Shower/ Hygiene ?May shower and wash wound with soap and water. ?Home Health ?Dressing changes to be completed by Home Health on Monday / Wednesday / Friday except when patient has scheduled visit at [facility]. ?Other Home Health Orders/Instructions: - [facility] ?Wound Treatment ?Wound #1 - Malleolus Wound Laterality: Right, Lateral ?Prim Dressing: MediHoney Gel, tube 1.5 (oz) 3 x Per Week/30 Days ?ary ?Discharge Instructions: Apply to wound bed as instructed ?Secondary Dressing: ComfortFoam Border, 3x3 in (silicone border) 3 x Per Week/30 Days ?Discharge Instructions: Apply over primary dressing as directed. ?Electronic Signature(s) ?Signed: 10/30/2021 10:37:45 AM By: Duanne Guess MD FACS ?Entered By: Duanne Guess on 10/30/2021 10:37:44 ?-------------------------------------------------------------------------------- ?Problem List Details ?Patient Name: ?Date of Service: ?Efraim Kaufmann 10/30/2021 9:00 A M ?Medical Record Number: [id number] ?Patient Account Number: [id number] ?Date of Birth/Sex: ?Treating RN: ?04/06/1929 (88 y.o. Damaris Schooner ?Primary Care Provider: [name] ?Other Clinician: ?Referring Provider: ?Treating Provider/Extender: Duanne Guess ?[name] ?Weeks in Treatment: 0 ?Active Problems ?ICD-10 ?Encounter ?Code Description Active Date MDM ?Diagnosis ?L97.311 Non-pressure chronic ulcer of right ankle  limited to breakdown of skin 10/30/2021 No Yes ?R73.03 Prediabetes 10/30/2021 No Yes ?I63.9 Cerebral infarction, unspecified 10/30/2021 No Yes ?I69.30 Unspecified sequelae of cerebral infarction 10/30/2021 No Yes ?Inactive Problems ?Resolved Problems ?Electronic Signature(s) ?Signed: 10/30/2021 10:03:29 AM By: Duanne Guess MD FACS ?Previous Signature: 10/30/2021 9:34:11 AM Version By: Duanne Guess MD FACS ?Entered By: Duanne Guess on 10/30/2021 10:03:28 ?-------------------------------------------------------------------------------- ?Progress Note Details ?Patient Name: ?Date of Service: ?Efraim Kaufmann 10/30/2021 9:00 A M ?Medical Record Number: [id number] ?Patient Account Number: [id number] ?Date of Birth/Sex: ?Treating RN: ?1929-05-15 (88 y.o. Damaris Schooner ?Primary Care Provider: [name] ?Other Clinician: ?Referring Provider: ?Treating Provider/Extender: Duanne Guess ?[name] ?Weeks in Treatment: 0 ?Subjective ?Chief Complaint ?Information obtained from Patient ?Patient seen for complaints of Non-Healing Wound. ?History of Present Illness (HPI) ?ADMISSION ?10/30/2021 ?This is an 88 year old man who is here, accompanied by his daughter, for evaluation of a small ulcer on his right lateral malleolus. His daughter says that it ?first appeared around 1 January. He does have home health care that comes once a week. The home health nurses and his daughter have been managing this ?at home, applying medical honey to the site with an Allevyn foam dressing. The thought has been that this was a pressure ulcer from either his other foot or ?perhaps simply against the bed sheets. It is small and punched out. It is difficult to even discern on first glance if it is even open. She also purchased a foam ?wrap for his ankle to avoid further pressure while he is in bed. He is still fairly active and ambulates independently in his home. His daughter showed me ?photographs of the initial appearance of the  wound. There was more periwound erythema at that time, which has now resolved. He has not been on any ?antibiotics for this. ?His past medical history includes a stroke with some mild sequelae. He is not

## 2021-10-30 NOTE — Progress Notes (Signed)
EDMOND, GINSBERG ([id number]) ?Visit Report for 10/30/2021 ?Abuse Risk Screen Details ?Patient Name: Date of Service: ?Nicholas Gaines 10/30/2021 9:00 A M ?Medical Record Number: [id number] ?Patient Account Number: [id number] ?Date of Birth/Sex: Treating RN: ?1928/11/06 (88 y.o. Damaris Schooner ?Primary Care Jamaurie Bernier: [name] Other Clinician: ?Referring Kariss Longmire: ?Treating Anetra Czerwinski/Extender: Duanne Guess ?[name] ?Weeks in Treatment: 0 ?Abuse Risk Screen Items ?Answer ?ABUSE RISK SCREEN: ?Has anyone close to you tried to hurt or harm you recentlyo No ?Do you feel uncomfortable with anyone in your familyo No ?Has anyone forced you do things that you didnt want to doo No ?Electronic Signature(s) ?Signed: 10/30/2021 4:25:50 PM By: Zenaida Deed RN, BSN ?Entered By: Zenaida Deed on 10/30/2021 09:34:27 ?-------------------------------------------------------------------------------- ?Activities of Daily Living Details ?Patient Name: Date of Service: ?Nicholas Gaines 10/30/2021 9:00 A M ?Medical Record Number: [id number] ?Patient Account Number: [id number] ?Date of Birth/Sex: Treating RN: ?10-Sep-1928 (88 y.o. Damaris Schooner ?Primary Care Aashna Matson: [name] Other Clinician: ?Referring Brance Dartt: ?Treating Lareina Espino/Extender: Duanne Guess ?[name] ?Weeks in Treatment: 0 ?Activities of Daily Living Items ?Answer ?Activities of Daily Living (Please select one for each item) ?Drive Automobile Not Able ?T Medications ?ake Completely Able ?Use T elephone Completely Able ?Care for Appearance Completely Able ?Use T oilet Completely Able ?Bath / Shower Completely Able ?Dress Self Completely Able ?Feed Self Completely Able ?Walk Need Assistance ?Get In / Out Bed Completely Able ?Housework Need Assistance ?Prepare Meals Need Assistance ?Handle Money Need Assistance ?Shop for Self Need Assistance ?Electronic Signature(s) ?Signed: 10/30/2021 4:25:50 PM By: Zenaida Deed RN, BSN ?Entered By: Zenaida Deed on 10/30/2021 09:35:46 ?-------------------------------------------------------------------------------- ?Education Screening Details ?Patient Name: ?Date of Service: ?Nicholas Gaines 10/30/2021 9:00 A M ?Medical Record Number: [id number] ?Patient Account Number: [id number] ?Date of Birth/Sex: ?Treating RN: ?June 09, 1929 (88 y.o. Damaris Schooner ?Primary Care Lowana Hable: [name] ?Other Clinician: ?Referring Decie Verne: ?Treating Shuree Brossart/Extender: Duanne Guess ?[name] ?Weeks in Treatment: 0 ?Primary Learner Assessed: Patient ?Learning Preferences/Education Level/Primary Language ?Learning Preference: Explanation, Demonstration, Printed Material ?Highest Education Level: High School ?Preferred Language: English ?Cognitive Barrier ?Language Barrier: No ?Translator Needed: No ?Memory Deficit: No ?Emotional Barrier: No ?Cultural/Religious Beliefs Affecting Medical Care: No ?Physical Barrier ?Impaired Vision: No ?Impaired Hearing: No ?Decreased Hand dexterity: No ?Knowledge/Comprehension ?Knowledge Level: High ?Comprehension Level: High ?Ability to understand written instructions: High ?Ability to understand verbal instructions: High ?Motivation ?Anxiety Level: Calm ?Cooperation: Cooperative ?Education Importance: Acknowledges Need ?Interest in Health Problems: Asks Questions ?Perception: Coherent ?Willingness to Engage in Self-Management High ?Activities: ?Readiness to Engage in Self-Management High ?Activities: ?Electronic Signature(s) ?Signed: 10/30/2021 4:25:50 PM By: Zenaida Deed RN, BSN ?Entered By: Zenaida Deed on 10/30/2021 09:36:14 ?-------------------------------------------------------------------------------- ?Fall Risk Assessment Details ?Patient Name: ?Date of Service: ?Nicholas Gaines 10/30/2021 9:00 A M ?Medical Record Number: [id number] ?Patient Account Number: [id number] ?Date of Birth/Sex: ?Treating RN: ?[birth year] (88 y.o. Damaris Schooner ?Primary Care Macauley Mossberg: [name] ?Other Clinician: ?Referring Gustavus Haskin: ?Treating Oralee Rapaport/Extender: Duanne Guess ?[name] ?Weeks in Treatment: 0 ?Fall Risk Assessment Items ?Have you had 2 or more falls in the last 12 monthso 0 Yes ?Have you had any fall that resulted in injury in the last 12 monthso 0 No ?FALLS RISK SCREEN ?History of falling - immediate or within 3 months 25 Yes ?Secondary diagnosis (Do you have 2 or more medical diagnoseso) 0 No ?Ambulatory aid ?None/bed rest/wheelchair/nurse 0 No ?Crutches/cane/walker 15 Yes ?Furniture 0 No ?Intravenous therapy Access/Saline/Heparin Lock 0 No ?Gait/Transferring ?Normal/ bed rest/  wheelchair 0 No ?Weak (short steps with or without shuffle, stooped but able to lift head while walking, may seek 10 Yes ?support from furniture) ?Impaired (short steps with shuffle, may have difficulty arising from chair, head down, impaired 0 No ?balance) ?Mental Status ?Oriented to own ability 0 Yes ?Electronic Signature(s) ?Signed: 10/30/2021 4:25:50 PM By: Zenaida Deed RN, BSN ?Entered By: Zenaida Deed on 10/30/2021 09:36:49 ?-------------------------------------------------------------------------------- ?Foot Assessment Details ?Patient Name: ?Date of Service: ?Nicholas Gaines 10/30/2021 9:00 A M ?Medical Record Number: [id number] ?Patient Account Number: [id number] ?Date of Birth/Sex: ?Treating RN: ?07/30/1929 (88 y.o. Damaris Schooner ?Primary Care Nyaja Dubuque: [name] ?Other Clinician: ?Referring Jayanna Kroeger: ?Treating Fay Swider/Extender: Duanne Guess ?[name] ?Weeks in Treatment: 0 ?Foot Assessment Items ?Site Locations ?+ = Sensation present, - = Sensation absent, C = Callus, U = Ulcer ?R = Redness, W = Warmth, M = Maceration, PU = Pre-ulcerative lesion ?F = Fissure, S = Swelling, D = Dryness ?Assessment ?Right: Left: ?Other Deformity: No No ?Prior Foot Ulcer: No No ?Prior Amputation: No No ?Charcot Joint: No No ?Ambulatory Status: Ambulatory With Help ?Assistance Device:  Walker ?Gait: Steady ?Electronic Signature(s) ?Signed: 10/30/2021 4:25:50 PM By: Zenaida Deed RN, BSN ?Entered By: Zenaida Deed on 10/30/2021 09:37:52 ?-------------------------------------------------------------------------------- ?Nutrition Risk Screening Details ?Patient Name: ?Date of Service: ?Nicholas Gaines 10/30/2021 9:00 A M ?Medical Record Number: [id number] ?Patient Account Number: [id number] ?Date of Birth/Sex: ?Treating RN: ?29-Oct-1928 (88 y.o. Damaris Schooner ?Primary Care Dalya Maselli: [name] ?Other Clinician: ?Referring Vu Liebman: ?Treating Datra Clary/Extender: Duanne Guess ?[name] ?Weeks in Treatment: 0 ?Height (in): 65 ?Weight (lbs): 146 ?Body Mass Index (BMI): 24.3 ?Nutrition Risk Screening Items ?Score Screening ?NUTRITION RISK SCREEN: ?I have an illness or condition that made me change the kind and/or amount of food I eat 0 No ?I eat fewer than two meals per day 0 No ?I eat few fruits and vegetables, or milk products 0 No ?I have three or more drinks of beer, liquor or wine almost every day 0 No ?I have tooth or mouth problems that make it hard for me to eat 0 No ?I don't always have enough money to buy the food I need 0 No ?I eat alone most of the time 1 Yes ?I take three or more different prescribed or over-the-counter drugs a day 1 Yes ?Without wanting to, I have lost or gained 10 pounds in the last six months 0 No ?I am not always physically able to shop, cook and/or feed myself 0 No ?Nutrition Protocols ?Good Risk Protocol 0 No interventions needed ?Moderate Risk Protocol ?High Risk Proctocol ?Risk Level: Good Risk ?Score: 2 ?Electronic Signature(s) ?Signed: 10/30/2021 4:25:50 PM By: Zenaida Deed RN, BSN ?Entered By: Zenaida Deed on 10/30/2021 09:37:18 ?

## 2021-11-13 ENCOUNTER — Encounter (HOSPITAL_BASED_OUTPATIENT_CLINIC_OR_DEPARTMENT_OTHER): Payer: Medicare Other | Admitting: General Surgery

## 2021-11-19 ENCOUNTER — Emergency Department (HOSPITAL_COMMUNITY): Payer: Medicare Other

## 2021-11-19 ENCOUNTER — Observation Stay (HOSPITAL_COMMUNITY): Payer: Medicare Other

## 2021-11-19 ENCOUNTER — Other Ambulatory Visit: Payer: Self-pay

## 2021-11-19 ENCOUNTER — Inpatient Hospital Stay (HOSPITAL_COMMUNITY)
Admission: EM | Admit: 2021-11-19 | Discharge: 2021-11-25 | DRG: 871 | Disposition: A | Discharge status: Skilled Nursing Facility | Payer: Medicare Other | Attending: Internal Medicine | Admitting: Internal Medicine

## 2021-11-19 DIAGNOSIS — Z20822 Contact with and (suspected) exposure to covid-19: Secondary | ICD-10-CM | POA: Diagnosis not present

## 2021-11-19 DIAGNOSIS — G40909 Epilepsy, unspecified, not intractable, without status epilepticus: Secondary | ICD-10-CM | POA: Diagnosis not present

## 2021-11-19 DIAGNOSIS — I350 Nonrheumatic aortic (valve) stenosis: Secondary | ICD-10-CM

## 2021-11-19 DIAGNOSIS — W06XXXA Fall from bed, initial encounter: Secondary | ICD-10-CM | POA: Diagnosis present

## 2021-11-19 DIAGNOSIS — I69891 Dysphagia following other cerebrovascular disease: Secondary | ICD-10-CM | POA: Diagnosis not present

## 2021-11-19 DIAGNOSIS — G9340 Encephalopathy, unspecified: Secondary | ICD-10-CM | POA: Diagnosis not present

## 2021-11-19 DIAGNOSIS — Z888 Allergy status to other drugs, medicaments and biological substances status: Secondary | ICD-10-CM | POA: Diagnosis not present

## 2021-11-19 DIAGNOSIS — R509 Fever, unspecified: Secondary | ICD-10-CM | POA: Diagnosis not present

## 2021-11-19 DIAGNOSIS — E785 Hyperlipidemia, unspecified: Secondary | ICD-10-CM | POA: Diagnosis not present

## 2021-11-19 DIAGNOSIS — R55 Syncope and collapse: Secondary | ICD-10-CM

## 2021-11-19 DIAGNOSIS — L89311 Pressure ulcer of right buttock, stage 1: Secondary | ICD-10-CM | POA: Diagnosis present

## 2021-11-19 DIAGNOSIS — I69828 Other speech and language deficits following other cerebrovascular disease: Secondary | ICD-10-CM | POA: Diagnosis not present

## 2021-11-19 DIAGNOSIS — J1289 Other viral pneumonia: Secondary | ICD-10-CM | POA: Diagnosis present

## 2021-11-19 DIAGNOSIS — G928 Other toxic encephalopathy: Secondary | ICD-10-CM | POA: Diagnosis present

## 2021-11-19 DIAGNOSIS — Z8673 Personal history of transient ischemic attack (TIA), and cerebral infarction without residual deficits: Secondary | ICD-10-CM

## 2021-11-19 DIAGNOSIS — A419 Sepsis, unspecified organism: Secondary | ICD-10-CM | POA: Diagnosis not present

## 2021-11-19 DIAGNOSIS — M1990 Unspecified osteoarthritis, unspecified site: Secondary | ICD-10-CM | POA: Diagnosis not present

## 2021-11-19 DIAGNOSIS — Z820 Family history of epilepsy and other diseases of the nervous system: Secondary | ICD-10-CM | POA: Diagnosis not present

## 2021-11-19 DIAGNOSIS — J9811 Atelectasis: Secondary | ICD-10-CM | POA: Diagnosis not present

## 2021-11-19 DIAGNOSIS — J159 Unspecified bacterial pneumonia: Secondary | ICD-10-CM | POA: Diagnosis not present

## 2021-11-19 DIAGNOSIS — Z66 Do not resuscitate: Secondary | ICD-10-CM | POA: Diagnosis present

## 2021-11-19 DIAGNOSIS — M6281 Muscle weakness (generalized): Secondary | ICD-10-CM | POA: Diagnosis not present

## 2021-11-19 DIAGNOSIS — G40219 Localization-related (focal) (partial) symptomatic epilepsy and epileptic syndromes with complex partial seizures, intractable, without status epilepticus: Secondary | ICD-10-CM | POA: Diagnosis not present

## 2021-11-19 DIAGNOSIS — R279 Unspecified lack of coordination: Secondary | ICD-10-CM | POA: Diagnosis not present

## 2021-11-19 DIAGNOSIS — Z823 Family history of stroke: Secondary | ICD-10-CM

## 2021-11-19 DIAGNOSIS — K219 Gastro-esophageal reflux disease without esophagitis: Secondary | ICD-10-CM | POA: Diagnosis present

## 2021-11-19 DIAGNOSIS — I69351 Hemiplegia and hemiparesis following cerebral infarction affecting right dominant side: Secondary | ICD-10-CM | POA: Diagnosis not present

## 2021-11-19 DIAGNOSIS — Y92009 Unspecified place in unspecified non-institutional (private) residence as the place of occurrence of the external cause: Secondary | ICD-10-CM

## 2021-11-19 DIAGNOSIS — B9730 Unspecified retrovirus as the cause of diseases classified elsewhere: Secondary | ICD-10-CM | POA: Diagnosis present

## 2021-11-19 DIAGNOSIS — I4891 Unspecified atrial fibrillation: Secondary | ICD-10-CM | POA: Diagnosis not present

## 2021-11-19 DIAGNOSIS — R9431 Abnormal electrocardiogram [ECG] [EKG]: Secondary | ICD-10-CM | POA: Diagnosis not present

## 2021-11-19 DIAGNOSIS — R2681 Unsteadiness on feet: Secondary | ICD-10-CM | POA: Diagnosis not present

## 2021-11-19 DIAGNOSIS — L89321 Pressure ulcer of left buttock, stage 1: Secondary | ICD-10-CM | POA: Diagnosis present

## 2021-11-19 DIAGNOSIS — Z7982 Long term (current) use of aspirin: Secondary | ICD-10-CM | POA: Diagnosis not present

## 2021-11-19 DIAGNOSIS — Z79899 Other long term (current) drug therapy: Secondary | ICD-10-CM

## 2021-11-19 DIAGNOSIS — J189 Pneumonia, unspecified organism: Secondary | ICD-10-CM | POA: Diagnosis not present

## 2021-11-19 DIAGNOSIS — D649 Anemia, unspecified: Secondary | ICD-10-CM | POA: Diagnosis not present

## 2021-11-19 DIAGNOSIS — I482 Chronic atrial fibrillation, unspecified: Secondary | ICD-10-CM | POA: Diagnosis not present

## 2021-11-19 DIAGNOSIS — W19XXXA Unspecified fall, initial encounter: Principal | ICD-10-CM

## 2021-11-19 DIAGNOSIS — M19011 Primary osteoarthritis, right shoulder: Secondary | ICD-10-CM | POA: Diagnosis present

## 2021-11-19 DIAGNOSIS — R0902 Hypoxemia: Secondary | ICD-10-CM | POA: Diagnosis not present

## 2021-11-19 DIAGNOSIS — S0990XA Unspecified injury of head, initial encounter: Secondary | ICD-10-CM | POA: Diagnosis not present

## 2021-11-19 DIAGNOSIS — J206 Acute bronchitis due to rhinovirus: Secondary | ICD-10-CM | POA: Diagnosis not present

## 2021-11-19 DIAGNOSIS — K449 Diaphragmatic hernia without obstruction or gangrene: Secondary | ICD-10-CM | POA: Diagnosis not present

## 2021-11-19 DIAGNOSIS — R0603 Acute respiratory distress: Secondary | ICD-10-CM | POA: Diagnosis not present

## 2021-11-19 DIAGNOSIS — Z807 Family history of other malignant neoplasms of lymphoid, hematopoietic and related tissues: Secondary | ICD-10-CM

## 2021-11-19 DIAGNOSIS — Z8249 Family history of ischemic heart disease and other diseases of the circulatory system: Secondary | ICD-10-CM | POA: Diagnosis not present

## 2021-11-19 DIAGNOSIS — M109 Gout, unspecified: Secondary | ICD-10-CM | POA: Diagnosis not present

## 2021-11-19 DIAGNOSIS — Z043 Encounter for examination and observation following other accident: Secondary | ICD-10-CM | POA: Diagnosis not present

## 2021-11-19 DIAGNOSIS — R0602 Shortness of breath: Secondary | ICD-10-CM | POA: Diagnosis not present

## 2021-11-19 DIAGNOSIS — I10 Essential (primary) hypertension: Secondary | ICD-10-CM | POA: Diagnosis not present

## 2021-11-19 DIAGNOSIS — R748 Abnormal levels of other serum enzymes: Secondary | ICD-10-CM | POA: Diagnosis present

## 2021-11-19 DIAGNOSIS — R6520 Severe sepsis without septic shock: Secondary | ICD-10-CM

## 2021-11-19 DIAGNOSIS — R2689 Other abnormalities of gait and mobility: Secondary | ICD-10-CM | POA: Diagnosis not present

## 2021-11-19 DIAGNOSIS — J690 Pneumonitis due to inhalation of food and vomit: Secondary | ICD-10-CM

## 2021-11-19 LAB — URINALYSIS, ROUTINE W REFLEX MICROSCOPIC
Bilirubin Urine: NEGATIVE
Glucose, UA: NEGATIVE mg/dL
Hgb urine dipstick: NEGATIVE
Ketones, ur: 5 mg/dL — AB
Leukocytes,Ua: NEGATIVE
Nitrite: NEGATIVE
Protein, ur: NEGATIVE mg/dL
Specific Gravity, Urine: 1.012 (ref 1.005–1.030)
pH: 6 (ref 5.0–8.0)

## 2021-11-19 LAB — CBC WITH DIFFERENTIAL/PLATELET
Abs Immature Granulocytes: 0.07 10*3/uL (ref 0.00–0.07)
Basophils Absolute: 0 10*3/uL (ref 0.0–0.1)
Basophils Relative: 0 %
Eosinophils Absolute: 0 10*3/uL (ref 0.0–0.5)
Eosinophils Relative: 0 %
HCT: 31.8 % — ABNORMAL LOW (ref 39.0–52.0)
Hemoglobin: 10.3 g/dL — ABNORMAL LOW (ref 13.0–17.0)
Immature Granulocytes: 1 %
Lymphocytes Relative: 5 %
Lymphs Abs: 0.5 10*3/uL — ABNORMAL LOW (ref 0.7–4.0)
MCH: 31.3 pg (ref 26.0–34.0)
MCHC: 32.4 g/dL (ref 30.0–36.0)
MCV: 96.7 fL (ref 80.0–100.0)
Monocytes Absolute: 0.7 10*3/uL (ref 0.1–1.0)
Monocytes Relative: 6 %
Neutro Abs: 10.4 10*3/uL — ABNORMAL HIGH (ref 1.7–7.7)
Neutrophils Relative %: 88 %
Platelets: 189 10*3/uL (ref 150–400)
RBC: 3.29 MIL/uL — ABNORMAL LOW (ref 4.22–5.81)
RDW: 13.9 % (ref 11.5–15.5)
WBC: 11.7 10*3/uL — ABNORMAL HIGH (ref 4.0–10.5)
nRBC: 0 % (ref 0.0–0.2)

## 2021-11-19 LAB — COMPREHENSIVE METABOLIC PANEL
ALT: 10 U/L (ref 0–44)
AST: 20 U/L (ref 15–41)
Albumin: 3.8 g/dL (ref 3.5–5.0)
Alkaline Phosphatase: 79 U/L (ref 38–126)
Anion gap: 8 (ref 5–15)
BUN: 15 mg/dL (ref 8–23)
CO2: 26 mmol/L (ref 22–32)
Calcium: 9 mg/dL (ref 8.9–10.3)
Chloride: 106 mmol/L (ref 98–111)
Creatinine, Ser: 1.12 mg/dL (ref 0.61–1.24)
GFR, Estimated: >60 mL/min (ref >60)
Glucose, Bld: 125 mg/dL — ABNORMAL HIGH (ref 70–99)
Potassium: 3.9 mmol/L (ref 3.5–5.1)
Sodium: 140 mmol/L (ref 135–145)
Total Bilirubin: 1 mg/dL (ref 0.3–1.2)
Total Protein: 6.8 g/dL (ref 6.5–8.1)

## 2021-11-19 LAB — RESPIRATORY PANEL BY PCR

## 2021-11-19 LAB — RESP PANEL BY RT-PCR (FLU A&B, COVID) ARPGX2
Influenza A by PCR: NEGATIVE
Influenza B by PCR: NEGATIVE
SARS Coronavirus 2 by RT PCR: NEGATIVE

## 2021-11-19 LAB — AMMONIA: Ammonia: <10 umol/L (ref 9–35)

## 2021-11-19 LAB — LACTIC ACID, PLASMA
Lactic Acid, Venous: 1.5 mmol/L (ref 0.5–1.9)
Lactic Acid, Venous: 1.4 mmol/L (ref 0.5–1.9)

## 2021-11-19 LAB — CK: Total CK: 448 U/L — ABNORMAL HIGH (ref 49–397)

## 2021-11-19 LAB — VALPROIC ACID LEVEL: Valproic Acid Lvl: 42 ug/mL — ABNORMAL LOW (ref 50.0–100.0)

## 2021-11-19 LAB — PROCALCITONIN: Procalcitonin: 2.07 ng/mL

## 2021-11-19 MED ORDER — VANCOMYCIN HCL 1500 MG/300ML IV SOLN
1500.0000 mg | Freq: Once | INTRAVENOUS | Status: AC
  Administered 2021-11-19: 1500 mg via INTRAVENOUS
  Filled 2021-11-19: qty 300

## 2021-11-19 MED ORDER — LACTATED RINGERS IV BOLUS
500.0000 mL | Freq: Once | INTRAVENOUS | Status: AC
  Administered 2021-11-19: 500 mL via INTRAVENOUS

## 2021-11-19 MED ORDER — SODIUM CHLORIDE 0.9 % IV SOLN
2.0000 g | Freq: Once | INTRAVENOUS | Status: AC
  Administered 2021-11-19: 2 g via INTRAVENOUS
  Filled 2021-11-19: qty 12.5

## 2021-11-19 MED ORDER — AMLODIPINE BESYLATE 5 MG PO TABS
5.0000 mg | ORAL_TABLET | Freq: Every day | ORAL | Status: DC
Start: 2021-11-20 — End: 2021-11-21
  Administered 2021-11-20 – 2021-11-21 (×2): 5 mg via ORAL
  Filled 2021-11-19 (×2): qty 1

## 2021-11-19 MED ORDER — DIVALPROEX SODIUM ER 500 MG PO TB24
500.0000 mg | ORAL_TABLET | Freq: Every day | ORAL | Status: DC
  Administered 2021-11-20 – 2021-11-25 (×6): 500 mg via ORAL
  Filled 2021-11-19 (×6): qty 1

## 2021-11-19 MED ORDER — ASPIRIN EC 325 MG PO TBEC
325.0000 mg | DELAYED_RELEASE_TABLET | Freq: Every day | ORAL | Status: DC
  Administered 2021-11-21 – 2021-11-22 (×2): 325 mg via ORAL
  Filled 2021-11-19 (×3): qty 1

## 2021-11-19 MED ORDER — SODIUM CHLORIDE 0.9 % IV SOLN
INTRAVENOUS | Status: AC
Start: 2021-11-19 — End: 2021-11-20

## 2021-11-19 MED ORDER — ACETAMINOPHEN 325 MG PO TABS
650.0000 mg | ORAL_TABLET | Freq: Four times a day (QID) | ORAL | Status: DC | PRN
  Administered 2021-11-21 – 2021-11-24 (×3): 650 mg via ORAL
  Filled 2021-11-19 (×3): qty 2

## 2021-11-19 MED ORDER — IOHEXOL 350 MG/ML SOLN
100.0000 mL | Freq: Once | INTRAVENOUS | Status: AC | PRN
  Administered 2021-11-19: 75 mL via INTRAVENOUS

## 2021-11-19 MED ORDER — SODIUM CHLORIDE 0.9 % IV SOLN: 2.0000 g | Freq: Two times a day (BID) | INTRAVENOUS | Status: DC

## 2021-11-19 MED ORDER — CLOPIDOGREL BISULFATE 75 MG PO TABS
75.0000 mg | ORAL_TABLET | Freq: Every day | ORAL | Status: DC
  Administered 2021-11-20 – 2021-11-25 (×6): 75 mg via ORAL
  Filled 2021-11-19 (×6): qty 1

## 2021-11-19 MED ORDER — ACETAMINOPHEN 650 MG RE SUPP
650.0000 mg | Freq: Four times a day (QID) | RECTAL | Status: DC | PRN
Start: 2021-11-19 — End: 2021-11-20
  Administered 2021-11-20: 650 mg via RECTAL
  Filled 2021-11-19: qty 1

## 2021-11-19 MED ORDER — ENOXAPARIN SODIUM 40 MG/0.4ML IJ SOSY
40.0000 mg | PREFILLED_SYRINGE | INTRAMUSCULAR | Status: DC
  Administered 2021-11-20 – 2021-11-24 (×5): 40 mg via SUBCUTANEOUS
  Filled 2021-11-19 (×5): qty 0.4

## 2021-11-19 MED ORDER — DIVALPROEX SODIUM ER 250 MG PO TB24
250.0000 mg | ORAL_TABLET | Freq: Every day | ORAL | Status: DC
Start: 2021-11-20 — End: 2021-11-25
  Administered 2021-11-20 – 2021-11-25 (×6): 250 mg via ORAL
  Filled 2021-11-19 (×6): qty 1

## 2021-11-19 MED ORDER — METRONIDAZOLE 500 MG/100ML IV SOLN
500.0000 mg | Freq: Two times a day (BID) | INTRAVENOUS | Status: DC
  Administered 2021-11-19: 500 mg via INTRAVENOUS
  Filled 2021-11-19: qty 100

## 2021-11-19 MED ORDER — VANCOMYCIN HCL 1750 MG/350ML IV SOLN: 1750.0000 mg | INTRAVENOUS | Status: DC

## 2021-11-19 MED ORDER — SODIUM CHLORIDE 0.9 % IV BOLUS
500.0000 mL | Freq: Once | INTRAVENOUS | Status: AC
  Administered 2021-11-19: 500 mL via INTRAVENOUS

## 2021-11-19 NOTE — H&P (Signed)
?History and Physical  ? ? ?Nicholas Gaines PO:[id number] DOB: 1929-01-08 DOA: 11/19/2021 ? ?PCP: Antony Contras, MD ?Patient coming from: home ? ?Chief Complaint: generalized weakness and possible syncope ? ?HPI: Nicholas Gaines is a 88 y.o. male with medical history significant of moderate aortic stenosis on echo 2021, atrial fibrillation, hyperlipidemia, seizure disorder, stroke, who presented with generalized weakness and a possible syncope. ? ?Patient is poor historian and unable to provide detailed history.  HP is obtained by talking to patient, his daughter at bedside, ED physician and chart review. ? ?Patient lives at his home by himself and he has a ring camera, with which his daughter monitor him.  Patient was in his usual state of health until this morning.  Patient was noted to slip off his bed onto the ground around 0730 AM this morning.  His daughter is not sure whether he had a syncope episode at that time or not.  When family arrived at his home, patient was alert but confused and lying on the ground.  1 episode of vomiting was noted.  The EMS was called and the patient was brought to the ED for further evaluation.  Patient cannot provide review of system.  Daughter reports no recent sickness, sick contact, fevers, upper respiratory infection, diarrhea, dysuria or urinary frequency or urgency.  ? ?In the emergency room, patient was noted to have some congested cough which is new per daughter.  Vitals showed temperature 101.103F, pulse 90, RR 20, BP 151/85 and room air O2 sats 95%.  Labs showed WBC 11.7, positive Rhinovirus / Enterovirus, normal lactic acid, CK448, nonrevealing CMP, glucose 125, negative UA, negative head CT, negative chest x-ray, CTPA concerning for pneumonia. ? ? ?Review of Systems: As per HPI otherwise 10 point review of systems negative.  ?Review of Systems ?Otherwise negative except as per HPI, including: ?General: Denies fever, chills, night sweats or unintended weight loss. ?Resp:  Denies  wheezing, shortness of breath..  Positive for congested cough ?Cardiac: Denies chest pain, palpitations, orthopnea, paroxysmal nocturnal dyspnea. ?GI: Denies abdominal pain, nausea, vomiting, diarrhea or constipation ?GU: Denies dysuria, frequency, hesitancy or incontinence ?MS: Denies muscle aches, joint pain or swelling ?Neuro: Denies headache, neurologic deficits (focal weakness, numbness, tingling), abnormal gait.  Positive for fall POA.  Positive for possible syncope POA.  Positive for generalized weakness. ?Psych: Denies anxiety, depression, SI/HI/AVH ?Skin: Denies new rashes or lesions ?ID: Denies sick contacts, exotic exposures, travel ? ?Past Medical History:  ?Diagnosis Date  ? A-fib ([place])   ? Arthritis   ? GERD (gastroesophageal reflux disease)   ? Heart murmur   ? HLD (hyperlipidemia)   ? Seizures ([place])   ? last sz 04/17/17  ? Stroke [facility])   ? tia  ? ? ?Past Surgical History:  ?Procedure Laterality Date  ? CARPAL TUNNEL RELEASE    ? About 10 yr ago (08/13/16)  ? LOOP RECORDER INSERTION N/A 09/16/2016  ? Procedure: Loop Recorder Insertion;  Surgeon: Damron Grayer, MD;  Location: [place] [facility];  Service: Cardiovascular;  Laterality: N/A;  ? TONSILLECTOMY    ? ? ?SOCIAL HISTORY: ? reports that he has never smoked. He has never used smokeless tobacco. He reports that he does not drink alcohol and does not use drugs. ? ?Allergies  ?Allergen Reactions  ? Rocephin [Ceftriaxone Sodium In Dextrose] Other (See Comments)  ?  C-Diff  ? Atorvastatin   ?  Other reaction(s): myalgia  ? Pravastatin   ?  Other reaction(s): myalgia  ? Rosuvastatin  Calcium   ?  Other reaction(s): fatigued  ? ? ?FAMILY HISTORY: ?Family History  ?Problem Relation Age of Onset  ? Hypertension Mother   ? Parkinson's disease Mother   ? Heart attack Mother   ? Stroke Sister   ? Lymphoma Sister   ? Stroke Brother   ? CVA Father   ? Stroke Paternal Grandmother   ?     in her early 27's  ? Stroke Paternal Grandfather   ?     in his early 4's   ? Pulmonary embolism Sister   ?     04/2018  ? Diabetes Neg Hx   ? ? ? ?Prior to Admission medications   ?Medication Sig Start Date End Date Taking? Authorizing Provider  ?acetaminophen (TYLENOL) 650 MG CR tablet Take 1,400 mg by mouth 2 (two) times daily.   Yes [provider]  ?amLODipine (NORVASC) 5 MG tablet Take 5 mg by mouth daily. 01/30/21  Yes [provider]  ?aspirin EC 325 MG tablet Take 1 tablet (325 mg total) by mouth daily. 12/17/19  Yes Wille Celeste, PA-C  ?Cholecalciferol 25 MCG (1000 UT) capsule Take 1 capsule (1,000 Units total) by mouth daily. 12/17/19  Yes Lassen, Arlo C, PA-C  ?clopidogrel (PLAVIX) 75 MG tablet Take 1 tablet (75 mg total) by mouth daily. 12/17/19  Yes Lassen, Arlo C, PA-C  ?divalproex (DEPAKOTE ER) 250 MG 24 hr tablet TAKE 1 TABLET BY MOUTH EVERY DAY WITH 500MG  TABLET FOR A TOTAL OF 750MG  ?Patient taking differently: Take 250 mg by mouth daily. 02/05/21  Yes Lomax, Amy, NP  ?divalproex (DEPAKOTE ER) 500 MG 24 hr tablet TAKE 1 TABLET BY MOUTH EVERY DAY WITH 250MG  TABLET FOR TOTAL OF 750MG  02/05/21  Yes Lomax, Amy, NP  ?ferrous sulfate 325 (65 FE) MG EC tablet Take 1 tablet (325 mg total) by mouth daily with breakfast. 12/17/19  Yes Granville Lewis C, PA-C  ?Glucosamine-Chondroit-Vit C-Mn (GLUCOSAMINE CHONDR 1500 COMPLX PO) Take 1 tablet by mouth 2 (two) times daily.   Yes [provider]  ?Multiple Vitamin (MULTIVITAMIN) tablet Take 1 tablet by mouth daily.   Yes [provider]  ?Omega-3 Fatty Acids (FISH OIL) 1000 MG CAPS Take 2,000 mg by mouth daily.   Yes [provider]  ?vitamin B-12 (CYANOCOBALAMIN) 1000 MCG tablet Take 1 tablet (1,000 mcg total) by mouth daily. 12/17/19  Yes Lassen, Arlo C, PA-C  ?ZETIA 10 MG tablet Take 1 tablet (10 mg total) by mouth daily. 12/17/19  Yes Lassen, Arlo C, PA-C  ?amLODipine (NORVASC) 10 MG tablet Take 1 tablet (10 mg total) by mouth daily. ?Patient not taking: Reported on 11/19/2021 12/17/19   Wille Celeste, PA-C  ?famotidine (PEPCID) 20 MG tablet Take 1 tablet (20 mg total) by mouth daily. ?Patient not taking: Reported on 11/19/2021 12/17/19   Wille Celeste, PA-C  ? ? ?Physical Exam: ?Vitals:  ? 11/19/21 1458 11/19/21 1612 11/19/21 1700 11/19/21 1730  ?BP: 132/79  138/75 130/79  ?Pulse: 86  85 84  ?Resp: 20  15 10   ?Temp:  (!) 100.5 ?F (38.1 ?C)    ?TempSrc:  Oral    ?SpO2: 94%  97% 97%  ? ? ? ? ?Constitutional: NAD, calm, comfortable.  Acute ill-appearing. ?Eyes: PERRL, lids and conjunctivae normal ?ENMT: Mucous membranes are moist. Posterior pharynx clear of any exudate or lesions.Normal dentition.  ?Neck: normal, supple, no masses, no thyromegaly ?Respiratory: clear to auscultation bilaterally, no wheezing, no crackles. Normal respiratory  effort. No accessory muscle use.  ?Cardiovascular: Regular rate and rhythm, no murmurs / rubs / gallops. No extremity edema. 2+ pedal pulses. No carotid bruits.  ?Abdomen: no tenderness, no masses palpated. No hepatosplenomegaly. Bowel sounds positive.  ?Musculoskeletal: no clubbing / cyanosis. No joint deformity upper and lower extremities. Good ROM, no contractures. Normal muscle tone.  ?Skin: no rashes, lesions, ulcers. No induration ?Neurologic: CN 2-12 grossly intact. Sensation intact, DTR normal. Strength 5/5 in all 4.  ?Psychiatric: Normal judgment and insight. Alert and oriented x 3. Normal mood.  ? ? ? ?Labs on Admission: I have personally reviewed following labs and imaging studies ? ?CBC: ?Recent Labs  ?Lab 11/19/21 ?1129  ?WBC 11.7*  ?NEUTROABS 10.4*  ?HGB 10.3*  ?HCT 31.8*  ?MCV 96.7  ?PLT 189  ? ?Basic Metabolic Panel: ?Recent Labs  ?Lab 11/19/21 ?1129  ?Syvanna Ciolino 140  ?K 3.9  ?CL 106  ?CO2 26  ?GLUCOSE 125*  ?BUN 15  ?CREATININE 1.12  ?CALCIUM 9.0  ? ?GFR: ?CrCl cannot be calculated (Unknown ideal weight.). ?Liver Function Tests: ?Recent Labs  ?Lab 11/19/21 ?1129  ?AST 20  ?ALT 10  ?ALKPHOS 79  ?BILITOT 1.0  ?PROT 6.8  ?ALBUMIN 3.8  ? ?No results for input(s): LIPASE,  AMYLASE in the last 168 hours. ?Recent Labs  ?Lab 11/19/21 ?1129  ?AMMONIA <10  ? ?Coagulation Profile: ?No results for input(s): INR, PROTIME in the last 168 hours. ?Cardiac Enzymes: ?Recent Labs  ?Lab 04

## 2021-11-19 NOTE — Progress Notes (Signed)
Pharmacy Antibiotic Note ? ?Nicholas Gaines is a 88 y.o. male admitted on 11/19/2021 with sepsis.  Pharmacy has been consulted for cefepime and vancomycin dosing. ? ?WBC 11.7, no fever on presentation. Patient will be given vancomycin 1500 mg IV x1 lading dose (~20 mg/kg), followed by maintenance dose as follows:  ?Vancomycin 1750 mg IV every 48 hours ?Scr used: 1.12 mg/dL ?Weight: 66 kg (obtained from previous visit 10/30/2021) ?Height: 65 inches (obtained from previous visit 10/30/2021) ?Vd coeff: 0.72 L/kg ?Est AUC: 519.9 ? ? ?Plan: ?Cefepime 2 g IV q12h  ?Vancomycin 1500 mg IV x1 followed by vancomycin 1750 mg IV q48h  ?F/u clinical course, cultures ?Monitor renal function ?Vancomycin levels at steady state as needed ? ?  ? ?Temp (24hrs), Avg:100.5 ?F (38.1 ?C), Min:99.6 ?F (37.6 ?C), Max:101.7 ?F (38.7 ?C) ? ?Recent Labs  ?Lab 11/19/21 ?1129 11/19/21 ?1555  ?WBC 11.7*  --   ?CREATININE 1.12  --   ?LATICACIDVEN 1.5 1.4  ?  ?CrCl cannot be calculated (Unknown ideal weight.).   ? ?Allergies  ?Allergen Reactions  ? Rocephin [Ceftriaxone Sodium In Dextrose] Other (See Comments)  ?  C-Diff  ? Atorvastatin   ?  Other reaction(s): myalgia  ? Pravastatin   ?  Other reaction(s): myalgia  ? Rosuvastatin Calcium   ?  Other reaction(s): fatigued  ? ? ?Antimicrobials this admission: ?Cefepime 4/13 >>  ?Vancomycin 4/13 >>  ?Metronidazole 4/13 >> ? ?Dose adjustments this admission: ?N/A ? ?Microbiology results: ?4/13 BCx: in process ? ?Thank you for allowing pharmacy to be a part of this patient?s care. ? ?Lissa Merlin, PharmD ?PGY1 Acute Care Pharmacy Resident  ?Phone: [phone] ?11/19/2021  10:27 PM ? ?Please check [email or web address] for unit-specific pharmacy phone numbers. ? ? ?

## 2021-11-19 NOTE — ED Notes (Signed)
[phone] [name] (Daughter) call for help/questions/updates ?

## 2021-11-19 NOTE — ED Provider Notes (Signed)
?[facility] ?Provider Note ? ? ?CSN: [id number] ?Arrival date & time: 11/19/21  1018 ? ?  ? ?History ? ?Chief Complaint  ?Patient presents with  ? Fall  ? ? ?Nicholas Gaines is a 88 y.o. male. ? ? ?Fall ?Patient wants fall.  Unsure exactly what happened.  Somewhat difficult to get history from patient.  Reportedly fell getting out of bed.  Reportedly had loss conscious.  For EMS found to be febrile.  Patient is without complaints at this time.  States has been ambulatory since the fall.  Not on blood thinners ? ?  ?Past Medical History:  ?Diagnosis Date  ? A-fib ([place])   ? Arthritis   ? GERD (gastroesophageal reflux disease)   ? Heart murmur   ? HLD (hyperlipidemia)   ? Seizures ([place])   ? last sz 04/17/17  ? Stroke [facility])   ? tia  ? ?Past Surgical History:  ?Procedure Laterality Date  ? CARPAL TUNNEL RELEASE    ? About 10 yr ago (08/13/16)  ? LOOP RECORDER INSERTION N/A 09/16/2016  ? Procedure: Loop Recorder Insertion;  Surgeon: Pankowski Grayer, MD;  Location: [place] [facility];  Service: Cardiovascular;  Laterality: N/A;  ? TONSILLECTOMY    ? ? ?Home Medications ?Prior to Admission medications   ?Medication Sig Start Date End Date Taking? Authorizing Provider  ?acetaminophen (TYLENOL) 650 MG CR tablet Take 1,400 mg by mouth 2 (two) times daily.   Yes [provider]  ?amLODipine (NORVASC) 5 MG tablet Take 5 mg by mouth daily. 01/30/21  Yes [provider]  ?aspirin EC 325 MG tablet Take 1 tablet (325 mg total) by mouth daily. 12/17/19  Yes Wille Celeste, PA-C  ?Cholecalciferol 25 MCG (1000 UT) capsule Take 1 capsule (1,000 Units total) by mouth daily. 12/17/19  Yes Lassen, Arlo C, PA-C  ?clopidogrel (PLAVIX) 75 MG tablet Take 1 tablet (75 mg total) by mouth daily. 12/17/19  Yes Lassen, Arlo C, PA-C  ?divalproex (DEPAKOTE ER) 250 MG 24 hr tablet TAKE 1 TABLET BY MOUTH EVERY DAY WITH 500MG  TABLET FOR A TOTAL OF 750MG  ?Patient taking differently: Take 250 mg by mouth daily.  02/05/21  Yes Lomax, Amy, NP  ?divalproex (DEPAKOTE ER) 500 MG 24 hr tablet TAKE 1 TABLET BY MOUTH EVERY DAY WITH 250MG  TABLET FOR TOTAL OF 750MG  02/05/21  Yes Lomax, Amy, NP  ?ferrous sulfate 325 (65 FE) MG EC tablet Take 1 tablet (325 mg total) by mouth daily with breakfast. 12/17/19  Yes Granville Lewis C, PA-C  ?Glucosamine-Chondroit-Vit C-Mn (GLUCOSAMINE CHONDR 1500 COMPLX PO) Take 1 tablet by mouth 2 (two) times daily.   Yes [provider]  ?Multiple Vitamin (MULTIVITAMIN) tablet Take 1 tablet by mouth daily.   Yes [provider]  ?Omega-3 Fatty Acids (FISH OIL) 1000 MG CAPS Take 2,000 mg by mouth daily.   Yes [provider]  ?vitamin B-12 (CYANOCOBALAMIN) 1000 MCG tablet Take 1 tablet (1,000 mcg total) by mouth daily. 12/17/19  Yes Lassen, Arlo C, PA-C  ?ZETIA 10 MG tablet Take 1 tablet (10 mg total) by mouth daily. 12/17/19  Yes Lassen, Arlo C, PA-C  ?amLODipine (NORVASC) 10 MG tablet Take 1 tablet (10 mg total) by mouth daily. ?Patient not taking: Reported on 11/19/2021 12/17/19   Wille Celeste, PA-C  ?famotidine (PEPCID) 20 MG tablet Take 1 tablet (20 mg total) by mouth daily. ?Patient not taking: Reported on 11/19/2021 12/17/19   Wille Celeste, PA-C  ?   ? ?  Allergies    ?Rocephin [ceftriaxone sodium in dextrose], Atorvastatin, Pravastatin, and Rosuvastatin calcium   ? ?Review of Systems   ?Review of Systems ? ?Physical Exam ?Updated Vital Signs ?BP 130/74   Pulse 91   Temp 99.6 ?F (37.6 ?C) (Oral)   Resp 12   SpO2 94%  ?Physical Exam ?Vitals and nursing note reviewed.  ?HENT:  ?   Head: Normocephalic.  ?Eyes:  ?   Extraocular Movements: Extraocular movements intact.  ?Cardiovascular:  ?   Rate and Rhythm: Regular rhythm.  ?Pulmonary:  ?   Breath sounds: No wheezing or rhonchi.  ?Abdominal:  ?   Tenderness: There is no abdominal tenderness.  ?Musculoskeletal:     ?   General: No swelling or tenderness.  ?   Cervical back: Neck supple. No tenderness.  ?   Comments: No cervical  tenderness.  No thoracic or lumbar tenderness.  No extremity tenderness.  Good range of motion of extremities.  ?Neurological:  ?   Mental Status: He is alert.  ?   Comments: Awake but somewhat difficult to get history from  ?Mild wound right lateral ankle.  No surrounding erythema ? ?ED Results / Procedures / Treatments   ?Labs ?(all labs ordered are listed, but only abnormal results are displayed) ?Labs Reviewed  ?VALPROIC ACID LEVEL - Abnormal; Notable for the following components:  ?    Result Value  ? Valproic Acid Lvl 42 (*)   ? All other components within normal limits  ?URINALYSIS, ROUTINE W REFLEX MICROSCOPIC - Abnormal; Notable for the following components:  ? Color, Urine STRAW (*)   ? Ketones, ur 5 (*)   ? All other components within normal limits  ?COMPREHENSIVE METABOLIC PANEL - Abnormal; Notable for the following components:  ? Glucose, Bld 125 (*)   ? All other components within normal limits  ?CBC WITH DIFFERENTIAL/PLATELET - Abnormal; Notable for the following components:  ? WBC 11.7 (*)   ? RBC 3.29 (*)   ? Hemoglobin 10.3 (*)   ? HCT 31.8 (*)   ? Neutro Abs 10.4 (*)   ? Lymphs Abs 0.5 (*)   ? All other components within normal limits  ?CULTURE, BLOOD (ROUTINE X 2)  ?CULTURE, BLOOD (ROUTINE X 2)  ?RESP PANEL BY RT-PCR (FLU A&B, COVID) [id number]  ?AMMONIA  ?LACTIC ACID, PLASMA  ? ? ?EKG ?EKG Interpretation ? ?Date/Time:  Thursday November 19 2021 10:29:37 EDT ?Ventricular Rate:  97 ?PR Interval:  139 ?QRS Duration: 90 ?QT Interval:  357 ?QTC Calculation: [id number] ?R Axis:   1 ?Text Interpretation: Sinus rhythm RSR' in V1 or V2, probably normal variant pacs have resolved Confirmed by Davonna Belling [phone]) on 11/19/2021 10:42:23 AM ? ?Radiology ?CT HEAD WO CONTRAST (5MM) ? ?Result Date: 11/19/2021 ?CLINICAL DATA:  Fall, head injury EXAM: CT HEAD WITHOUT CONTRAST TECHNIQUE: Contiguous axial images were obtained from the base of the skull through the vertex without intravenous contrast. RADIATION DOSE  REDUCTION: This exam was performed according to the departmental dose-optimization program which includes automated exposure control, adjustment of the mA and/or kV according to patient size and/or use of iterative reconstruction technique. COMPARISON:  11/27/2019 FINDINGS: Brain: Generalized atrophy, moderate in degree. Moderate white matter hypodensity bilaterally unchanged. Negative for acute infarct, acute hemorrhage, or mass lesion. Vascular: Negative for hyperdense vessel Skull: Negative Sinuses/Orbits: Paranasal sinuses clear.  Negative orbit Other: None IMPRESSION: No acute abnormality Moderate atrophy and moderate chronic microvascular ischemic change in the white matter. No interval change. Electronically Signed  By: Franchot Gallo M.D.   On: 11/19/2021 12:20  ? ?DG Chest Portable 1 View ? ?Result Date: 11/19/2021 ?CLINICAL DATA:  Fever, shortness of breath EXAM: PORTABLE CHEST 1 VIEW COMPARISON:  11/17/2019 FINDINGS: Transverse diameter of heart is increased. Possible dense calcification is seen in the mitral annulus. There are no signs of pulmonary edema or focal pulmonary consolidation. There is soft tissue fullness in the retrocardiac region suggesting possible fixed hiatal hernia. Is no pleural effusion or pneumothorax. Degenerative changes are noted in both shoulders. IMPRESSION: Cardiomegaly. There are no signs of pulmonary edema or focal pulmonary consolidation. Possible fixed hiatal hernia. Electronically Signed   By: Elmer Picker M.D.   On: 11/19/2021 11:22   ? ?Procedures ?Procedures  ? ? ?Medications Ordered in ED ?Medications - No data to display ? ?ED Course/ Medical Decision Making/ A&P ?  ?                        ?Medical Decision Making ?Amount and/or Complexity of Data Reviewed ?Labs: ordered. ?Radiology: ordered. ? ? ?Patient with fall.  Witnessed on ring camera.  Patient without complaints although does have some confusion.  Found to be febrile.  No clear source of infection at  this time.  Has had 1 episode of vomiting at home prior to this.  No known diarrhea.  Has wound on right ankle but does not appear infected.  Chest x-ray independently interpreted and reassuring without pneumonia.  Head CT

## 2021-11-19 NOTE — ED Triage Notes (Signed)
Pt BIB EMS due to a fall from home. Pt denies blood thinners. Pt did have LOC. Pt is axox4...VSS. EMS gave 500cc of fluid  ?

## 2021-11-20 ENCOUNTER — Observation Stay (HOSPITAL_COMMUNITY): Payer: Medicare Other

## 2021-11-20 ENCOUNTER — Encounter (HOSPITAL_BASED_OUTPATIENT_CLINIC_OR_DEPARTMENT_OTHER): Payer: Medicare Other | Admitting: General Surgery

## 2021-11-20 DIAGNOSIS — J9811 Atelectasis: Secondary | ICD-10-CM | POA: Diagnosis not present

## 2021-11-20 DIAGNOSIS — Z66 Do not resuscitate: Secondary | ICD-10-CM | POA: Diagnosis present

## 2021-11-20 DIAGNOSIS — E785 Hyperlipidemia, unspecified: Secondary | ICD-10-CM | POA: Diagnosis present

## 2021-11-20 DIAGNOSIS — I482 Chronic atrial fibrillation, unspecified: Secondary | ICD-10-CM | POA: Diagnosis present

## 2021-11-20 DIAGNOSIS — A419 Sepsis, unspecified organism: Secondary | ICD-10-CM | POA: Diagnosis present

## 2021-11-20 DIAGNOSIS — B9730 Unspecified retrovirus as the cause of diseases classified elsewhere: Secondary | ICD-10-CM | POA: Diagnosis present

## 2021-11-20 DIAGNOSIS — R55 Syncope and collapse: Secondary | ICD-10-CM

## 2021-11-20 DIAGNOSIS — Z807 Family history of other malignant neoplasms of lymphoid, hematopoietic and related tissues: Secondary | ICD-10-CM | POA: Diagnosis not present

## 2021-11-20 DIAGNOSIS — G928 Other toxic encephalopathy: Secondary | ICD-10-CM | POA: Diagnosis present

## 2021-11-20 DIAGNOSIS — Z8673 Personal history of transient ischemic attack (TIA), and cerebral infarction without residual deficits: Secondary | ICD-10-CM | POA: Diagnosis not present

## 2021-11-20 DIAGNOSIS — R509 Fever, unspecified: Secondary | ICD-10-CM

## 2021-11-20 DIAGNOSIS — J1289 Other viral pneumonia: Secondary | ICD-10-CM | POA: Diagnosis present

## 2021-11-20 DIAGNOSIS — Z823 Family history of stroke: Secondary | ICD-10-CM | POA: Diagnosis not present

## 2021-11-20 DIAGNOSIS — I350 Nonrheumatic aortic (valve) stenosis: Secondary | ICD-10-CM | POA: Diagnosis present

## 2021-11-20 DIAGNOSIS — L89321 Pressure ulcer of left buttock, stage 1: Secondary | ICD-10-CM | POA: Diagnosis present

## 2021-11-20 DIAGNOSIS — Z888 Allergy status to other drugs, medicaments and biological substances status: Secondary | ICD-10-CM | POA: Diagnosis not present

## 2021-11-20 DIAGNOSIS — J206 Acute bronchitis due to rhinovirus: Secondary | ICD-10-CM | POA: Diagnosis not present

## 2021-11-20 DIAGNOSIS — Z20822 Contact with and (suspected) exposure to covid-19: Secondary | ICD-10-CM | POA: Diagnosis present

## 2021-11-20 DIAGNOSIS — Z7982 Long term (current) use of aspirin: Secondary | ICD-10-CM | POA: Diagnosis not present

## 2021-11-20 DIAGNOSIS — W06XXXA Fall from bed, initial encounter: Secondary | ICD-10-CM | POA: Diagnosis present

## 2021-11-20 DIAGNOSIS — J159 Unspecified bacterial pneumonia: Secondary | ICD-10-CM | POA: Diagnosis present

## 2021-11-20 DIAGNOSIS — K449 Diaphragmatic hernia without obstruction or gangrene: Secondary | ICD-10-CM | POA: Diagnosis present

## 2021-11-20 DIAGNOSIS — M19011 Primary osteoarthritis, right shoulder: Secondary | ICD-10-CM | POA: Diagnosis present

## 2021-11-20 DIAGNOSIS — G40909 Epilepsy, unspecified, not intractable, without status epilepticus: Secondary | ICD-10-CM | POA: Diagnosis present

## 2021-11-20 DIAGNOSIS — R0603 Acute respiratory distress: Secondary | ICD-10-CM | POA: Diagnosis not present

## 2021-11-20 DIAGNOSIS — Y92009 Unspecified place in unspecified non-institutional (private) residence as the place of occurrence of the external cause: Secondary | ICD-10-CM | POA: Diagnosis not present

## 2021-11-20 DIAGNOSIS — K219 Gastro-esophageal reflux disease without esophagitis: Secondary | ICD-10-CM | POA: Diagnosis present

## 2021-11-20 DIAGNOSIS — Z820 Family history of epilepsy and other diseases of the nervous system: Secondary | ICD-10-CM | POA: Diagnosis not present

## 2021-11-20 DIAGNOSIS — J189 Pneumonia, unspecified organism: Secondary | ICD-10-CM | POA: Diagnosis present

## 2021-11-20 DIAGNOSIS — I10 Essential (primary) hypertension: Secondary | ICD-10-CM | POA: Diagnosis present

## 2021-11-20 DIAGNOSIS — L89311 Pressure ulcer of right buttock, stage 1: Secondary | ICD-10-CM | POA: Diagnosis present

## 2021-11-20 DIAGNOSIS — Z8249 Family history of ischemic heart disease and other diseases of the circulatory system: Secondary | ICD-10-CM | POA: Diagnosis not present

## 2021-11-20 LAB — ECHOCARDIOGRAM COMPLETE
AR max vel: 0.92 cm2
AV Area VTI: 0.75 cm2
AV Area mean vel: 0.89 cm2
AV Mean grad: 40 mmHg
AV Peak grad: 73.6 mmHg
Ao pk vel: 4.29 m/s
Area-P 1/2: 2.88 cm2
MV VTI: 2.13 cm2
S' Lateral: 2.2 cm

## 2021-11-20 LAB — CBC
HCT: 32 % — ABNORMAL LOW (ref 39.0–52.0)
Hemoglobin: 10.7 g/dL — ABNORMAL LOW (ref 13.0–17.0)
MCH: 31.8 pg (ref 26.0–34.0)
MCHC: 33.4 g/dL (ref 30.0–36.0)
MCV: 95 fL (ref 80.0–100.0)
Platelets: 155 10*3/uL (ref 150–400)
RBC: 3.37 MIL/uL — ABNORMAL LOW (ref 4.22–5.81)
RDW: 13.9 % (ref 11.5–15.5)
WBC: 10.6 10*3/uL — ABNORMAL HIGH (ref 4.0–10.5)
nRBC: 0 % (ref 0.0–0.2)

## 2021-11-20 LAB — BRAIN NATRIURETIC PEPTIDE: B Natriuretic Peptide: 447.5 pg/mL — ABNORMAL HIGH (ref 0.0–100.0)

## 2021-11-20 MED ORDER — SODIUM CHLORIDE 0.9 % IV SOLN
500.0000 mg | INTRAVENOUS | Status: DC
  Administered 2021-11-20 – 2021-11-21 (×2): 500 mg via INTRAVENOUS
  Filled 2021-11-20 (×2): qty 5

## 2021-11-20 MED ORDER — VITAMIN B-12 1000 MCG PO TABS
1000.0000 ug | ORAL_TABLET | Freq: Every day | ORAL | Status: DC
  Administered 2021-11-20 – 2021-11-25 (×6): 1000 ug via ORAL
  Filled 2021-11-20 (×6): qty 1

## 2021-11-20 MED ORDER — ADULT MULTIVITAMIN W/MINERALS CH
1.0000 | ORAL_TABLET | Freq: Every day | ORAL | Status: DC
  Administered 2021-11-20 – 2021-11-25 (×6): 1 via ORAL
  Filled 2021-11-20 (×6): qty 1

## 2021-11-20 MED ORDER — SODIUM CHLORIDE 3 % IN NEBU
4.0000 mL | INHALATION_SOLUTION | Freq: Two times a day (BID) | RESPIRATORY_TRACT | Status: DC
  Administered 2021-11-20 – 2021-11-22 (×5): 4 mL via RESPIRATORY_TRACT
  Filled 2021-11-20 (×6): qty 4

## 2021-11-20 MED ORDER — SODIUM CHLORIDE 0.9 % IV SOLN
2.0000 g | INTRAVENOUS | Status: AC
  Administered 2021-11-20 – 2021-11-24 (×5): 2 g via INTRAVENOUS
  Filled 2021-11-20 (×5): qty 20

## 2021-11-20 MED ORDER — SACCHAROMYCES BOULARDII 250 MG PO CAPS
250.0000 mg | ORAL_CAPSULE | Freq: Two times a day (BID) | ORAL | Status: DC
  Administered 2021-11-20 – 2021-11-25 (×10): 250 mg via ORAL
  Filled 2021-11-20 (×12): qty 1

## 2021-11-20 MED ORDER — IPRATROPIUM-ALBUTEROL 0.5-2.5 (3) MG/3ML IN SOLN
3.0000 mL | Freq: Four times a day (QID) | RESPIRATORY_TRACT | Status: DC | PRN
  Administered 2021-11-21: 3 mL via RESPIRATORY_TRACT
  Filled 2021-11-20 (×2): qty 3

## 2021-11-20 NOTE — Progress Notes (Signed)
? Nicholas Gaines  [id number] DOB: [birth year] DOA: 11/19/2021 ?PCP: Antony Contras, MD   ? ?Brief Narrative:  ?88 year old with a history of moderate aortic stenosis via echo 2021, chronic atrial fibrillation, HLD, seizure disorder, and CVA who presented to the ED with severe generalized weakness and a possible syncopal spell.  The patient lives alone but his family monitors him with a ring camera in the home and noted that he had slipped off his bed onto the ground around 730 the morning of his admission.  Family arrived and the patient was alert but confused is still laying on the ground.  EMS was summoned.  In the ER the patient was found to have a temperature of 101.1 with saturations were 95% on room air.  He was found to be positive for rhinovirus and enterovirus with a CT of the chest suggestive of atypical pneumonia. ? ?Consultants:  ?None ? ?Code Status: NO CODE BLUE ? ?DVT prophylaxis: ?Lovenox ? ?Interim Hx: ?During the night the patient had difficulty handling copious secretions and suffered an acute episode of respiratory distress with tachypnea.  He required nasotracheal suction.  Repeat CXR was without new findings. He has no complaints at the time of my visit. He is alert but not oriented to time or situation. He continues to have difficulty w/ thick secretions.  ? ?Assessment & Plan: ? ?Rhinovirus pneumonia +/- Superimposed bacterial pneumonia  ?Suspect combination of viral as well as bacterial PNA - cont empiric abx and supportive care  ? ?Acute toxic metabolic encephalopathy ?Due to acute illness -CT head without acute findings - monitor with ongoing supportive care and treatment of acute infection  ? ?Moderate aortic stenosis ?Noted on TTE 2021- hemodynamically stable at present  ? ?Chronic atrial fibrillation ?Rate controlled at present ? ?Advanced right shoulder arthritis with effusion and calcified loose body ?Incidentally noted on CT chest ? ?Large hiatal hernia ?Incidentally noted on CT  chest ? ? ?Family Communication: No family present at time of exam ?Disposition: From home -suspect patient will no longer be able to live independently -PT/OT to evaluate once patient has improved clinically ? ?Objective: ?Blood pressure 134/90, pulse 91, temperature 98.1 ?F (36.7 ?C), temperature source Oral, resp. rate 14, SpO2 91 %. ? ?Intake/Output Summary (Last 24 hours) at 11/20/2021 1640 ?Last data filed at 11/20/2021 1547 ?Gross per 24 hour  ?Intake 1731.04 ml  ?Output --  ?Net 1731.04 ml  ? ?There were no vitals filed for this visit. ? ?Examination: ?General: Alert but confused ?Lungs: Fine diffuse crackles with no wheezing with difficulty managing upper respiratory secretions ?Cardiovascular: Irregularly irregular with rate controlled with prominent 3/6 holosystolic murmur ?Abdomen: Nontender, nondistended, soft, bowel sounds positive, no rebound, no ascites, no appreciable mass ?Extremities: No significant cyanosis, clubbing, or edema bilateral lower extremities ? ?CBC: ?Recent Labs  ?Lab 11/19/21 ?1129 11/20/21 ?0308  ?WBC 11.7* 10.6*  ?NEUTROABS 10.4*  --   ?HGB 10.3* 10.7*  ?HCT 31.8* 32.0*  ?MCV 96.7 95.0  ?PLT 189 155  ? ?Basic Metabolic Panel: ?Recent Labs  ?Lab 11/19/21 ?1129  ?NA 140  ?K 3.9  ?CL 106  ?CO2 26  ?GLUCOSE 125*  ?BUN 15  ?CREATININE 1.12  ?CALCIUM 9.0  ? ?GFR: ?CrCl cannot be calculated (Unknown ideal weight.). ? ?Liver Function Tests: ?Recent Labs  ?Lab 11/19/21 ?1129  ?AST 20  ?ALT 10  ?ALKPHOS 79  ?BILITOT 1.0  ?PROT 6.8  ?ALBUMIN 3.8  ? ? ?Cardiac Enzymes: ?Recent Labs  ?Lab 11/19/21 ?1555  ?CKTOTAL 448*  ? ? ?  HbA1C: ?Hgb A1c MFr Bld  ?Date/Time Value Ref Range Status  ?08/03/2016 05:29 AM 5.6 4.8 - 5.6 % Final  ?  Comment:  ?  (NOTE) ?        Pre-diabetes: 5.7 - 6.4 ?        Diabetes: >6.4 ?        Glycemic control for adults with diabetes: <7.0 ?  ? ? ?Scheduled Meds: ? amLODipine  5 mg Oral Daily  ? aspirin EC  325 mg Oral Daily  ? clopidogrel  75 mg Oral Daily  ? divalproex   250 mg Oral Daily  ? divalproex  500 mg Oral Daily  ? enoxaparin (LOVENOX) injection  40 mg Subcutaneous Q24H  ? multivitamin with minerals  1 tablet Oral Daily  ? saccharomyces boulardii  250 mg Oral BID  ? sodium chloride HYPERTONIC  4 mL Nebulization BID  ? vitamin B-12  1,000 mcg Oral Daily  ? ?Continuous Infusions: ? sodium chloride 75 mL/hr at 11/20/21 0530  ? azithromycin Stopped (11/20/21 1342)  ? cefTRIAXone (ROCEPHIN)  IV Stopped (11/20/21 1228)  ? ? ? LOS: 0 days  ? ?Cherene Altes, MD ?[facility] ?Office  [phone] ?Pager - Text Page per Shea Evans ? ?If 7PM-7AM, please contact night-coverage per Amion ?11/20/2021, 4:40 PM ? ? ? ?

## 2021-11-20 NOTE — ED Notes (Signed)
X-ray at bedside at this time.

## 2021-11-20 NOTE — Evaluation (Addendum)
Physical Therapy Evaluation ?Patient Details ?Name: Nicholas Gaines ?MRN: [id number] ?DOB: 03-27-1929 ?Today's Date: 11/20/2021 ? ?History of Present Illness ? Pt adm 4/13 with weakness and fall. Pt with sepsis due to PNA. PMH - severe arthritis, afib, seizures, stroke, aortic stenosis.  ?Clinical Impression ? Pt presents to PT with decreased mobility due to weakness, poor balance, and poor activity tolerance. Pt looks very frail and respiratory status appears tenuous. If he medically improves he will need SNF. If medical status doesn't improve he may need custodial/hospice type care.    ?   ? ?Recommendations for follow up therapy are one component of a multi-disciplinary discharge planning process, led by the attending physician.  Recommendations may be updated based on patient status, additional functional criteria and insurance authorization. ? ?Follow Up Recommendations Skilled nursing-short term rehab (<3 hours/day) (vs hospice/custodial care if medical status declines) ? ?  ?Assistance Recommended at Discharge Frequent or constant Supervision/Assistance  ?Patient can return home with the following ? Two people to help with walking and/or transfers;Two people to help with bathing/dressing/bathroom;Assistance with feeding;Assistance with cooking/housework;Direct supervision/assist for financial management;Direct supervision/assist for medications management;Assist for transportation ? ?  ?Equipment Recommendations None recommended by PT  ?Recommendations for Other Services ?    ?  ?Functional Status Assessment Patient has had a recent decline in their functional status and/or demonstrates limited ability to make significant improvements in function in a reasonable and predictable amount of time  ? ?  ?Precautions / Restrictions Precautions ?Precautions: Fall ?Restrictions ?Weight Bearing Restrictions: No  ? ?  ? ?Mobility ? Bed Mobility ?Overal bed mobility: Needs Assistance ?Bed Mobility: Supine to Sit, Sit to  Supine ?  ?  ?Supine to sit: Total assist, HOB elevated ?Sit to supine: Total assist, +2 for physical assistance ?  ?General bed mobility comments: Assist for all aspect ?  ? ?Transfers ?  ?  ?  ?  ?  ?  ?  ?  ?  ?General transfer comment: Did not attempt due to weakness ?  ? ?Ambulation/Gait ?  ?  ?  ?  ?  ?  ?  ?  ? ?Stairs ?  ?  ?  ?  ?  ? ?Wheelchair Mobility ?  ? ?Modified Rankin (Stroke Patients Only) ?  ? ?  ? ?Balance Overall balance assessment: Needs assistance ?Sitting-balance support: Bilateral upper extremity supported, Feet unsupported ?Sitting balance-Leahy Scale: Poor ?Sitting balance - Comments: Pt sat EOB x 10 minutes with min assist ?Postural control: Posterior lean ?  ?  ?  ?  ?  ?  ?  ?  ?  ?  ?  ?  ?  ?  ?   ? ? ? ?Pertinent Vitals/Pain Pain Assessment ?Pain Assessment: Faces ?Faces Pain Scale: Hurts little more ?Pain Location: hips/shoulders ?Pain Descriptors / Indicators: Grimacing ?Pain Intervention(s): Limited activity within patient's tolerance, Repositioned  ? ? ?Home Living Family/patient expects to be discharged to:: Private residence ?Living Arrangements: Alone ?Available Help at Discharge: Family;Available PRN/intermittently ?Type of Home: House ?Home Access: Ramped entrance ?  ?  ?  ?Home Layout: One level ?Home Equipment: Conservation officer, nature (2 wheels);Cane - quad;Shower seat ?   ?  ?Prior Function Prior Level of Function : Needs assist ?  ?  ?  ?  ?  ?  ?Mobility Comments: Modified independent using rolling walker ?  ?  ? ? ?Hand Dominance  ? Dominant Hand: Right ? ?  ?Extremity/Trunk Assessment  ? Upper Extremity Assessment ?Upper Extremity  Assessment: Defer to OT evaluation ?  ? ?Lower Extremity Assessment ?Lower Extremity Assessment: RLE deficits/detail;LLE deficits/detail ?RLE Deficits / Details: Strength <3/5. ROM limited hip/knee extension ?LLE Deficits / Details: Strength <3/5. ROM limited hip/knee extension ?  ? ?   ?Communication  ? Communication: HOH  ?Cognition  Arousal/Alertness: Awake/alert ?Behavior During Therapy: Flat affect ?Overall Cognitive Status: Impaired/Different from baseline ?Area of Impairment: Problem solving, Following commands ?  ?  ?  ?  ?  ?  ?  ?  ?  ?  ?  ?Following Commands: Follows one step commands inconsistently, Follows one step commands with increased time ?  ?  ?Problem Solving: Slow processing, Requires verbal cues, Requires tactile cues, Difficulty sequencing, Decreased initiation ?  ?  ?  ? ?  ?General Comments General comments (skin integrity, edema, etc.): VSS on 5L but pt with congested sounding breathing ? ?  ?Exercises    ? ?Assessment/Plan  ?  ?PT Assessment Patient needs continued PT services  ?PT Problem List Decreased strength;Decreased range of motion;Decreased activity tolerance;Decreased balance;Decreased mobility;Cardiopulmonary status limiting activity;Pain ? ?   ?  ?PT Treatment Interventions DME instruction;Gait training;Functional mobility training;Therapeutic activities;Therapeutic exercise;Balance training;Patient/family education   ? ?PT Goals (Current goals can be found in the Care Plan section)  ?Acute Rehab PT Goals ?Patient Stated Goal: Not stated ?PT Goal Formulation: With patient/family ?Time For Goal Achievement: 12/04/21 ?Potential to Achieve Goals: Fair ? ?  ?Frequency Min 2X/week ?  ? ? ?Co-evaluation   ?  ?  ?  ?  ? ? ?  ?AM-PAC PT "6 Clicks" Mobility  ?Outcome Measure Help needed turning from your back to your side while in a flat bed without using bedrails?: Total ?Help needed moving from lying on your back to sitting on the side of a flat bed without using bedrails?: Total ?Help needed moving to and from a bed to a chair (including a wheelchair)?: Total ?Help needed standing up from a chair using your arms (e.g., wheelchair or bedside chair)?: Total ?Help needed to walk in hospital room?: Total ?Help needed climbing 3-5 steps with a railing? : Total ?6 Click Score: 6 ? ?  ?End of Session   ?Activity  Tolerance: Patient limited by fatigue ?Patient left: in bed;with call bell/phone within reach;with family/visitor present ?  ?PT Visit Diagnosis: Other abnormalities of gait and mobility (R26.89);Muscle weakness (generalized) (M62.81);History of falling (Z91.81);Pain ?Pain - Right/Left:  (bilateral) ?Pain - part of body: Shoulder;Hip ?  ? ?Time: [id number] ?PT Time Calculation (min) (ACUTE ONLY): 21 min ? ? ?Charges:   PT Evaluation ?$PT Eval Moderate Complexity: 1 Mod ?  ?  ?   ? ? ?[facility] PT ?Acute Rehabilitation Services ?Office [phone] ? ? ?Shary Decamp [facility] ?11/20/2021, 4:04 PM ? ?

## 2021-11-20 NOTE — Progress Notes (Signed)
Nasotracheal suction performed with 14 fr catheter down the left nares with copious yellow/tan secretions. Patient tolerated NTS; RN at bedside. ?

## 2021-11-20 NOTE — ED Notes (Signed)
Patient coughing up light green colored sputum, this RN provided suction for patient as well. Patient continues on 2 liters via nasal cannula but audible wheezing and congestion noted when patient breathing. Provider paged at this time and requested to call this RN.  ?

## 2021-11-20 NOTE — ED Notes (Signed)
Pt difficult to wake, secretions audible in oropharynx from hallway. NTS performed- copious secretions suctioned, patient responds with minimal gag reflex, saturations remain >93%. ?

## 2021-11-20 NOTE — ED Notes (Signed)
Fluids restarted per Julian Reil MD, BNP is resulted. ?

## 2021-11-20 NOTE — ED Notes (Signed)
Pt had an episode of stool incontinence, cleaned and barrier cream applied to pt's bottom. Pt repositioned and warm blankets given.  ?

## 2021-11-20 NOTE — ED Notes (Signed)
This RN spoke with admitting provider Gardener via phone to explain to him patient's condition and worsening breathing. Per provider will order chest x-ray and BNP and continue to keep patient on nasal cannula but increase oxygen as needed. Per provider attempt to maintain patient's spo2 at 90%.  ?

## 2021-11-20 NOTE — ED Notes (Signed)
RT at bedside at this time to evaluate patient.  ?

## 2021-11-20 NOTE — ED Notes (Signed)
Pt more alert, opens eyes when this RN enters room, this RN informs pt of further NTS and pt expresses understanding. NTS performed with copious secretions suctioned, pt has some gag response, returned to [place] after suctioning, remains >92% during NTS. ?

## 2021-11-20 NOTE — Progress Notes (Signed)
Echocardiogram ?2D Echocardiogram has been performed. ? ?Oneal Deputy Ashden Sonnenberg RDCS ?11/20/2021, 9:43 AM ?

## 2021-11-20 NOTE — Progress Notes (Signed)
NT suction performed with 78fr catheter down left nares with copious tan/yellow secretions. Pt tolerated well.Pt is currently stable and states breathing is much better ?

## 2021-11-20 NOTE — Progress Notes (Signed)
NT suctioned pt after weak flutter attempts and cough. Moderate white secretions noted with some blood tinge. Pt was able to produce two strong productive coughs post suctioning. Will continue to monitor.  ?

## 2021-11-20 NOTE — Significant Event (Signed)
Pt seen and evaluated at bedside for worsening respiratory status: increased crackles, increased RR to 29 and hypoxia now requiring 2L via [place] to maintain sat of 90. ? ?Repeat CXR obtained which doesn't appear to show any new findings. ? ?Overall IVF since ED presentation = 1L + ABx (maint fluid hadnt even been started yet really). ? ?HR and BP look okay. ? ?Continues to be febrile: 100.9. ? ?Suspect pt worsening respiratory status is secondary to a combination of PNA as well as secretions. ? ?RRT is performing NT suctioning on patient now to see if this helps. ? ?Update: pt sat and breath sounds significantly improved post NT suctioning. ?

## 2021-11-20 NOTE — ED Notes (Signed)
Oral suctioned x2. Pt transferred to echo at this time. ?

## 2021-11-20 NOTE — ED Notes (Signed)
Patient placed on 2 liters of oxygen via nasal cannula at this time to maintain spo2 above 90% while asleep. Patient tolerating well. ?

## 2021-11-20 NOTE — ED Notes (Signed)
Oral suctioning performed. Patient is able to cough on command to assist with moving secretions. O2 saturation remains >93%. ?

## 2021-11-20 NOTE — ED Notes (Signed)
Oral suctioning performed x2. Pt able to cough up secretions. O2 95% on room air.  ?

## 2021-11-21 DIAGNOSIS — R509 Fever, unspecified: Secondary | ICD-10-CM | POA: Diagnosis not present

## 2021-11-21 LAB — BASIC METABOLIC PANEL
Anion gap: 7 (ref 5–15)
BUN: 16 mg/dL (ref 8–23)
CO2: 22 mmol/L (ref 22–32)
Calcium: 8.3 mg/dL — ABNORMAL LOW (ref 8.9–10.3)
Chloride: 109 mmol/L (ref 98–111)
Creatinine, Ser: 1.16 mg/dL (ref 0.61–1.24)
GFR, Estimated: 59 mL/min — ABNORMAL LOW (ref >60)
Glucose, Bld: 108 mg/dL — ABNORMAL HIGH (ref 70–99)
Potassium: 3.7 mmol/L (ref 3.5–5.1)
Sodium: 138 mmol/L (ref 135–145)

## 2021-11-21 LAB — CBC
HCT: 28.8 % — ABNORMAL LOW (ref 39.0–52.0)
Hemoglobin: 9.5 g/dL — ABNORMAL LOW (ref 13.0–17.0)
MCH: 30.9 pg (ref 26.0–34.0)
MCHC: 33 g/dL (ref 30.0–36.0)
MCV: 93.8 fL (ref 80.0–100.0)
Platelets: 134 10*3/uL — ABNORMAL LOW (ref 150–400)
RBC: 3.07 MIL/uL — ABNORMAL LOW (ref 4.22–5.81)
RDW: 13.9 % (ref 11.5–15.5)
WBC: 9.8 10*3/uL (ref 4.0–10.5)
nRBC: 0 % (ref 0.0–0.2)

## 2021-11-21 LAB — PROCALCITONIN: Procalcitonin: 26.14 ng/mL

## 2021-11-21 MED ORDER — AZITHROMYCIN 250 MG PO TABS
500.0000 mg | ORAL_TABLET | Freq: Every day | ORAL | Status: AC
  Administered 2021-11-22 – 2021-11-24 (×3): 500 mg via ORAL
  Filled 2021-11-21 (×3): qty 2

## 2021-11-21 NOTE — Progress Notes (Signed)
? Nicholas Gaines  [id number] DOB: July 30, 1929 DOA: 11/19/2021 ?PCP: Antony Contras, MD   ? ?Brief Narrative:  ?88 year old with a history of moderate aortic stenosis via echo 2021, chronic atrial fibrillation, HLD, seizure disorder, and CVA who presented to the ED with severe generalized weakness and a possible syncopal spell.  The patient lives alone but his family monitors him with a ring camera in the home and noted that he had slipped off his bed onto the ground around 730 the morning of his admission.  Family arrived and the patient was alert but confused is still laying on the ground.  EMS was summoned.  In the ER the patient was found to have a temperature of 101.1 with saturations were 95% on room air.  He was found to be positive for rhinovirus and enterovirus with a CT of the chest suggestive of atypical pneumonia. ? ?Consultants:  ?None ? ?Code Status: NO CODE BLUE ? ?DVT prophylaxis: ?Lovenox ? ?Interim Hx: ?No acute events reported overnight.  Afebrile.  Vital signs stable.  Still requiring 5 L nasal cannula support.  States he is feeling better today.  Is more alert and interactive.  Family at bedside states he has significantly improved today.  No respiratory distress. ? ?Assessment & Plan: ? ?Rhinovirus pneumonia +/- Superimposed bacterial pneumonia  ?Suspect combination of viral as well as bacterial PNA - cont empiric abx and supportive care  ? ?Acute toxic metabolic encephalopathy ?Due to acute illness -CT head without acute findings - monitor with ongoing supportive care and treatment of acute infection -wean oxygen as able ? ?Severe aortic stenosis ?Noted on TTE 2021-TTE this admission notes EF 65-70% with no WMA and grade 1 DD with severe AoVS - discussed progression to severe aortic stenosis with family and patient at bedside ? ?Chronic atrial fibrillation ?Rate controlled at present ? ?Advanced right shoulder arthritis with effusion and calcified loose body ?Incidentally noted on CT chest -no  acute issues presently ? ?Large hiatal hernia ?Incidentally noted on CT chest ? ? ?Family Communication: Spoke with daughter at bedside at length ?Disposition: From home -suspect patient will no longer be able to live independently -PT/OT to evaluate ? ?Objective: ?Blood pressure 122/69, pulse 91, temperature 97.7 ?F (36.5 ?C), temperature source Oral, resp. rate 18, SpO2 92 %. ? ?Intake/Output Summary (Last 24 hours) at 11/21/2021 1031 ?Last data filed at 11/20/2021 1547 ?Gross per 24 hour  ?Intake 1731.04 ml  ?Output --  ?Net 1731.04 ml  ? ? ?There were no vitals filed for this visit. ? ?Examination: ?General: More alert today, pleasant, not in extremitas ?Lungs: Fine crackles diffusely persist with no wheezing ?Cardiovascular: Irregularly irregular with rate controlled with 3/6 holosystolic murmur ?Abdomen: NT/ND, soft, BS positive, no rebound ?Extremities: Trace bilateral lower extremity edema without cyanosis ? ?CBC: ?Recent Labs  ?Lab 11/19/21 ?1129 11/20/21 ?0308 11/21/21 ?[id number]  ?WBC 11.7* 10.6* 9.8  ?NEUTROABS 10.4*  --   --   ?HGB 10.3* 10.7* 9.5*  ?HCT 31.8* 32.0* 28.8*  ?MCV 96.7 95.0 93.8  ?PLT 189 155 134*  ? ? ?Basic Metabolic Panel: ?Recent Labs  ?Lab 11/19/21 ?1129 11/21/21 ?0056  ?NA 140 138  ?K 3.9 3.7  ?CL 106 109  ?CO2 26 22  ?GLUCOSE 125* 108*  ?BUN 15 16  ?CREATININE 1.12 1.16  ?CALCIUM 9.0 8.3*  ? ? ?GFR: ?CrCl cannot be calculated (Unknown ideal weight.). ? ?Liver Function Tests: ?Recent Labs  ?Lab 11/19/21 ?1129  ?AST 20  ?ALT 10  ?ALKPHOS 79  ?  BILITOT 1.0  ?PROT 6.8  ?ALBUMIN 3.8  ? ? ? ?Cardiac Enzymes: ?Recent Labs  ?Lab 11/19/21 ?1555  ?CKTOTAL 448*  ? ? ? ?HbA1C: ?Hgb A1c MFr Bld  ?Date/Time Value Ref Range Status  ?08/03/2016 05:29 AM 5.6 4.8 - 5.6 % Final  ?  Comment:  ?  (NOTE) ?        Pre-diabetes: 5.7 - 6.4 ?        Diabetes: >6.4 ?        Glycemic control for adults with diabetes: <7.0 ?  ? ? ?Scheduled Meds: ? amLODipine  5 mg Oral Daily  ? aspirin EC  325 mg Oral Daily  ?  clopidogrel  75 mg Oral Daily  ? divalproex  250 mg Oral Daily  ? divalproex  500 mg Oral Daily  ? enoxaparin (LOVENOX) injection  40 mg Subcutaneous Q24H  ? multivitamin with minerals  1 tablet Oral Daily  ? saccharomyces boulardii  250 mg Oral BID  ? sodium chloride HYPERTONIC  4 mL Nebulization BID  ? vitamin B-12  1,000 mcg Oral Daily  ? ?Continuous Infusions: ? azithromycin Stopped (11/20/21 1342)  ? cefTRIAXone (ROCEPHIN)  IV 2 g (11/21/21 0954)  ? ? ? LOS: 1 day  ? ?Cherene Altes, MD ?[facility] ?Office  [phone] ?Pager - Text Page per Shea Evans ? ?If 7PM-7AM, please contact night-coverage per Amion ?11/21/2021, 10:31 AM ? ? ? ?

## 2021-11-22 DIAGNOSIS — R509 Fever, unspecified: Secondary | ICD-10-CM | POA: Diagnosis not present

## 2021-11-22 LAB — CBC
HCT: 31.5 % — ABNORMAL LOW (ref 39.0–52.0)
Hemoglobin: 10.3 g/dL — ABNORMAL LOW (ref 13.0–17.0)
MCH: 31.2 pg (ref 26.0–34.0)
MCHC: 32.7 g/dL (ref 30.0–36.0)
MCV: 95.5 fL (ref 80.0–100.0)
Platelets: 146 10*3/uL — ABNORMAL LOW (ref 150–400)
RBC: 3.3 MIL/uL — ABNORMAL LOW (ref 4.22–5.81)
RDW: 13.7 % (ref 11.5–15.5)
WBC: 8.2 10*3/uL (ref 4.0–10.5)
nRBC: 0 % (ref 0.0–0.2)

## 2021-11-22 LAB — COMPREHENSIVE METABOLIC PANEL
ALT: 13 U/L (ref 0–44)
AST: 33 U/L (ref 15–41)
Albumin: 2.6 g/dL — ABNORMAL LOW (ref 3.5–5.0)
Alkaline Phosphatase: 59 U/L (ref 38–126)
Anion gap: 9 (ref 5–15)
BUN: 18 mg/dL (ref 8–23)
CO2: 23 mmol/L (ref 22–32)
Calcium: 8.4 mg/dL — ABNORMAL LOW (ref 8.9–10.3)
Chloride: 101 mmol/L (ref 98–111)
Creatinine, Ser: 1.14 mg/dL (ref 0.61–1.24)
GFR, Estimated: >60 mL/min (ref >60)
Glucose, Bld: 119 mg/dL — ABNORMAL HIGH (ref 70–99)
Potassium: 3.7 mmol/L (ref 3.5–5.1)
Sodium: 133 mmol/L — ABNORMAL LOW (ref 135–145)
Total Bilirubin: 0.6 mg/dL (ref 0.3–1.2)
Total Protein: 5.6 g/dL — ABNORMAL LOW (ref 6.5–8.1)

## 2021-11-22 LAB — MAGNESIUM: Magnesium: 1.9 mg/dL (ref 1.7–2.4)

## 2021-11-22 MED ORDER — AZITHROMYCIN 250 MG PO TABS
250.0000 mg | ORAL_TABLET | Freq: Once | ORAL | Status: AC
  Administered 2021-11-22: 250 mg via ORAL
  Filled 2021-11-22: qty 1

## 2021-11-22 NOTE — Evaluation (Signed)
Occupational Therapy Evaluation ?Patient Details ?Name: Nicholas Gaines ?MRN: [id number] ?DOB: 09/04/1928 ?Today's Date: 11/22/2021 ? ? ?History of Present Illness Pt adm 4/13 with weakness and fall. Pt with sepsis due to PNA. PMH - severe arthritis, afib, seizures, stroke, aortic stenosis.  ? ?Clinical Impression ?  ?88 yo male who was previously indep with mobility using rollator and received supervision and intermittent assistance from daughter. His daughter manages his medications at home. He is currently lethargic and showing decreased ability to participate in formal evaluation. Daughter present and providing information regarding hx. Note limited ROM in R shoulder due to hx of arthritis. Pt with notable wheezing and assisted with use of flutter spirometer. Daughter concerned with pt being weaker than previous day. He has not been able to mobilize and she is worried for his skin integrity given hx of R medial malleolus wound that he was undergoing outpatient wound care for. Daughter reports dressing last changed on Wednesday. Notified RN of wound presence as well as several pills found folded in patients blankets. Assisted RN with application of mepalex to B heels and changing of mepalex to sacrum as pressure noted to buttock during turning. Pt unable to tolerate sitting EOB. Left semi side-lying to assist with skin integrity and offloading of buttock. Requested RN order prevalon boots for BLEs to protect heels. Will continue to follow. ?   ? ?Recommendations for follow up therapy are one component of a multi-disciplinary discharge planning process, led by the attending physician.  Recommendations may be updated based on patient status, additional functional criteria and insurance authorization.  ? ?Follow Up Recommendations ? Skilled nursing-short term rehab (<3 hours/day)  ?  ?Assistance Recommended at Discharge Frequent or constant Supervision/Assistance  ?Patient can return home with the following Two people to  help with bathing/dressing/bathroom;Two people to help with walking and/or transfers;Direct supervision/assist for financial management;Direct supervision/assist for medications management;Assistance with feeding;Assist for transportation;Help with stairs or ramp for entrance ? ?  ?Functional Status Assessment ? Patient has had a recent decline in their functional status and/or demonstrates limited ability to make significant improvements in function in a reasonable and predictable amount of time  ?Equipment Recommendations ? Other (comment) (to be determined by facility)  ?  ?   ?Precautions / Restrictions Precautions ?Precautions: Fall ?Restrictions ?Weight Bearing Restrictions: No  ? ?  ? ?Mobility Bed Mobility ?Overal bed mobility: Needs Assistance ?Bed Mobility: Rolling ?Rolling: Max assist, +2 for physical assistance ?  ?  ? ?  ?Balance Overall balance assessment: History of Falls (unable to progress to EOB) ?  ?  ?  ?   ? ?ADL either performed or assessed with clinical judgement  ? ?ADL Overall ADL's : Needs assistance/impaired ?Eating/Feeding: Bed level;Maximal assistance ?  ?Grooming: Bed level;Maximal assistance ?  ?Upper Body Bathing: Bed level;Total assistance ?  ?Lower Body Bathing: Bed level;Total assistance ?  ?Upper Body Dressing : Maximal assistance;Bed level ?  ?Lower Body Dressing: Total assistance;Bed level ?  ?Toilet Transfer: Total assistance;+2 for physical assistance ?  ?Toileting- Clothing Manipulation and Hygiene: Total assistance;+2 for physical assistance ?  ?  ?  ?Functional mobility during ADLs: Maximal assistance ?   ? ? ? ?Vision Patient Visual Report: No change from baseline ?Vision Assessment?: No apparent visual deficits (but limited participation in eval and closing eyes frequently)  ?   ?   ?   ? ?Pertinent Vitals/Pain Pain Assessment ?Pain Assessment: No/denies pain ?Pain Intervention(s): Limited activity within patient's tolerance  ? ? ? ?Hand  Dominance Right ?   ?Extremity/Trunk Assessment Upper Extremity Assessment ?Upper Extremity Assessment: RUE deficits/detail;LUE deficits/detail ?RUE Deficits / Details: severe osteoarthritis with R shoulder flexion, abduction and external rotation limited to 10-15 degrees ?LUE Deficits / Details: L shoulder limited to approx 70 degrees flexion ?  ?Lower Extremity Assessment ?Lower Extremity Assessment: Defer to PT evaluation ?  ?  ?  ?Communication Communication ?Communication: HOH ?  ?Cognition Arousal/Alertness: Lethargic ?Behavior During Therapy: Flat affect (did smile slightly when talking to OT) ?Overall Cognitive Status: Impaired/Different from baseline ?Area of Impairment: Following commands, Memory, Attention ?  ?  ?  ?  ?Following Commands: Follows one step commands inconsistently, Follows one step commands with increased time ?  ?  ?Problem Solving: Slow processing, Requires verbal cues, Requires tactile cues, Difficulty sequencing, Decreased initiation ?  ?  ?  ?General Comments  daughter reports R lateral malleolus with healing wound and undergoes dressing change 3x/week currently with soiled mepalex in place and RN made aware, mepalex over sacrum but notable pressure to buttock, RN made aware and arrived to assess; assisted with application of new mepalex to sacrum and to B heels ? ?  ?   ?   ? ? ?Home Living Family/patient expects to be discharged to:: Private residence ?Living Arrangements: Alone ?Available Help at Discharge: Family;Available PRN/intermittently ?Type of Home: House ?Home Access: Ramped entrance ?  ?  ?Home Layout: One level ?  ?  ?Bathroom Shower/Tub: Walk-in shower ?  ?Bathroom Toilet: Handicapped height ?  ?  ?Home Equipment: Conservation officer, nature (2 wheels);Cane - quad;Shower seat ?  ?  ?  ? ?  ?Prior Functioning/Environment Prior Level of Function : Needs assist ?  ?  ?  ?Mobility Comments: Modified independent using rolling walker ?ADLs Comments: needs assist for medication management at baseline ?  ? ?   ?  ?OT Problem List: Decreased strength;Decreased range of motion;Decreased activity tolerance;Impaired balance (sitting and/or standing);Decreased cognition;Decreased safety awareness;Impaired UE functional use ?  ?   ?OT Treatment/Interventions: Self-care/ADL training;Therapeutic exercise;Therapeutic activities;Cognitive remediation/compensation;Patient/family education  ?  ?OT Goals(Current goals can be found in the care plan section) Acute Rehab OT Goals ?Patient Stated Goal: Patient unable to report goals. Daughter would like him to progress back to baseline ?OT Goal Formulation: With family ?Time For Goal Achievement: 12/06/21 ?Potential to Achieve Goals: Poor ?ADL Goals ?Pt Will Perform Eating: with min assist ?Pt Will Perform Grooming: with min assist ?Pt Will Perform Upper Body Bathing: with mod assist ?Pt Will Perform Upper Body Dressing: with min assist ?Pt Will Transfer to Toilet: bedside commode;with mod assist  ?OT Frequency: Min 2X/week ?  ? ?   ?AM-PAC OT "6 Clicks" Daily Activity     ?Outcome Measure Help from another person eating meals?: A Lot ?Help from another person taking care of personal grooming?: A Lot ?Help from another person toileting, which includes using toliet, bedpan, or urinal?: Total ?Help from another person bathing (including washing, rinsing, drying)?: Total ?Help from another person to put on and taking off regular upper body clothing?: A Lot ?Help from another person to put on and taking off regular lower body clothing?: Total ?6 Click Score: 9 ?  ?End of Session Nurse Communication: Mobility status;Other (comment) (concern for skin integrity) ? ?Activity Tolerance: Patient limited by lethargy;Patient limited by fatigue ?Patient left: in bed;with family/visitor present;with nursing/sitter in room;Other (comment) (semi side-lying for pressure offloading) ? ?OT Visit Diagnosis: Other abnormalities of gait and mobility (R26.89);History of falling (Z91.81);Muscle weakness  (generalized) (  M62.81);Feeding difficulties (R63.3)  ?              ?Time: [id number] ?OT Time Calculation (min): 40 min ?Charges:  OT General Charges ?$OT Visit: 1 Visit ?OT Evaluation ?$OT Eval Moderate Complexity: 1 Mod ?

## 2021-11-22 NOTE — [place] FL2 (Signed)
?[place] MEDICAID FL2 LEVEL OF CARE SCREENING TOOL  ?  ? ?IDENTIFICATION  ?Patient Name: ?Nicholas Gaines Birthdate: 1928/12/10 Sex: male Admission Date (Current Location): ?11/19/2021  ?[place] and [place] Number: ? [place] ?  Facility and Address:  ?[facility], [place] [address] [place], [place] [address] ?     Provider Number: ?[id number]  ?Attending Physician Name and Address:  ?Cherene Altes, MD ? Relative Name and Phone Number:  ?Denny Peon (Daughter)   [phone] ?   ?Current Level of Care: ?Hospital Recommended Level of Care: ?[place] Prior Approval Number: ?  ? ?Date Approved/Denied: ?  PASRR Number: ?[id number] ? ?Discharge Plan: ?SNF ?  ? ?Current Diagnoses: ?Patient Active Problem List  ? Diagnosis Date Noted  ? Pneumonia 11/20/2021  ? Fever and chills 11/19/2021  ? Sepsis ([place]) 11/19/2021  ? PNA (pneumonia) 11/19/2021  ? Acute bronchitis due to Rhinovirus 11/19/2021  ? Aspiration pneumonia ([place]) 11/19/2021  ? Syncope, vasovagal 11/19/2021  ? Aortic stenosis 11/19/2021  ? Syncope   ? Elevated CK   ? Generalized weakness 08/09/2019  ? Seizures ([place]) 06/04/2019  ? Complex partial seizure with impairment of consciousness at onset [facility]) 05/29/2018  ? Mild cognitive impairment 05/29/2018  ? Palpitations 09/16/2016  ? Normocytic anemia 09/09/2016  ? Aortic valve calcification 09/09/2016  ? Aortic valve sclerosis 09/09/2016  ? BPH (benign prostatic hyperplasia) 09/09/2016  ? Cardiac murmur 09/09/2016  ? Essential (primary) hypertension 09/09/2016  ? Facial weakness status post cerebrovascular accident 09/09/2016  ? H/O: stroke with residual effects 09/09/2016  ? History of stroke 09/09/2016  ? Prediabetes 09/09/2016  ? Vitamin D deficiency 09/09/2016  ? Alteration of consciousness 08/14/2016  ? Acute encephalopathy 08/02/2016  ? Difficulty speaking 08/02/2016  ? Bronchitis 08/02/2016  ? HLD (hyperlipidemia) 08/02/2016  ? Stroke [facility])   ? GERD (gastroesophageal  reflux disease)   ? Gastroesophageal reflux disease without esophagitis   ? Transient cerebral ischemia   ? ? ?Orientation RESPIRATION BLADDER Height & Weight   ?  ?Self, Time, Situation, Place ? O2 (5L of o2 [place]) Incontinent Weight:   ?Height:     ?BEHAVIORAL SYMPTOMS/MOOD NEUROLOGICAL BOWEL NUTRITION STATUS  ?    Incontinent Diet  ?AMBULATORY STATUS COMMUNICATION OF NEEDS Skin   ?Extensive Assist Verbally Normal ?  ?  ?  ?    ?     ?     ? ? ?Personal Care Assistance Level of Assistance  ?Bathing, Feeding, Dressing Bathing Assistance: Maximum assistance ?Feeding assistance: Limited assistance ?Dressing Assistance: Maximum assistance ?   ? ?Functional Limitations Info  ?Sight, Hearing, Speech Sight Info: Adequate ?Hearing Info: Adequate ?Speech Info: Adequate  ? ? ?SPECIAL CARE FACTORS FREQUENCY  ?PT (By licensed PT), OT (By licensed OT)   ?  ?PT Frequency: 5x a week ?OT Frequency: 5x a week ?  ?  ?  ?   ? ? ?Contractures Contractures Info: Not present  ? ? ?Additional Factors Info  ?Code Status, Allergies Code Status Info: DNR ?Allergies Info: Rocephin (Ceftriaxone Sodium In Dextrose)   Atorvastatin   Pravastatin   Rosuvastatin Calcium ?  ?  ?  ?   ? ?Current Medications (11/22/2021):  This is the current hospital active medication list ?Current Facility-Administered Medications  ?Medication Dose Route Frequency Provider Last Rate Last Admin  ? acetaminophen (TYLENOL) tablet 650 mg  650 mg Oral Q6H PRN Nicoletta Dress, Na, MD   650 mg at 11/21/21 2133  ? aspirin EC tablet  325 mg  325 mg Oral Daily Nicoletta Dress, Na, MD   325 mg at 11/22/21 [id number]  ? azithromycin (ZITHROMAX) tablet 500 mg  500 mg Oral Daily Willette Cluster, RPH   500 mg at 11/22/21 [id number]  ? cefTRIAXone (ROCEPHIN) 2 g in sodium chloride 0.9 % 100 mL IVPB  2 g Intravenous Q24H Cherene Altes, MD 200 mL/hr at 11/22/21 0956 2 g at 11/22/21 0956  ? clopidogrel (PLAVIX) tablet 75 mg  75 mg Oral Daily Nicoletta Dress, Na, MD   75 mg at 11/22/21 [id number]  ? divalproex (DEPAKOTE ER) 24 hr  tablet 250 mg  250 mg Oral Daily Nicoletta Dress, Na, MD   250 mg at 11/22/21 0953  ? divalproex (DEPAKOTE ER) 24 hr tablet 500 mg  500 mg Oral Daily Nicoletta Dress, Na, MD   500 mg at 11/22/21 [id number]  ? enoxaparin (LOVENOX) injection 40 mg  40 mg Subcutaneous Q24H Li, Na, MD   40 mg at 11/21/21 2133  ? ipratropium-albuterol (DUONEB) 0.5-2.5 (3) MG/3ML nebulizer solution 3 mL  3 mL Nebulization Q6H PRN Etta Quill, DO   3 mL at 11/21/21 [id number]  ? multivitamin with minerals tablet 1 tablet  1 tablet Oral Daily Cherene Altes, MD   1 tablet at 11/22/21 [id number]  ? saccharomyces boulardii (FLORASTOR) capsule 250 mg  250 mg Oral BID Cherene Altes, MD   250 mg at 11/22/21 [id number]  ? sodium chloride HYPERTONIC 3 % nebulizer solution 4 mL  4 mL Nebulization BID Cherene Altes, MD   4 mL at 11/22/21 [id number]  ? vitamin B-12 (CYANOCOBALAMIN) tablet 1,000 mcg  1,000 mcg Oral Daily Cherene Altes, MD   1,000 mcg at 11/22/21 [id number]  ? ? ? ?Discharge Medications: ?Please see discharge summary for a list of discharge medications. ? ?Relevant Imaging Results: ? ?Relevant Lab Results: ? ? ?Additional Information ?SS#: [id number]; [place] COVID-19 Vaccine 10/09/2019 , 09/15/2019 ? ?Marian Meneely Renold Don, LCSWA ? ? ? ? ?

## 2021-11-22 NOTE — Progress Notes (Signed)
? Nicholas Gaines  [id number] DOB: 1928/09/20 DOA: 11/19/2021 ?PCP: Antony Contras, MD   ? ?Brief Narrative:  ?88 year old with a history of moderate aortic stenosis via echo 2021, chronic atrial fibrillation, HLD, seizure disorder, and CVA who presented to the ED with severe generalized weakness and a possible syncopal spell.  The patient lives alone but his family monitors him with a ring camera in the home and noted that he had slipped off his bed onto the ground around 730 the morning of his admission.  Family arrived and the patient was alert but confused is still laying on the ground.  EMS was summoned.  In the ER the patient was found to have a temperature of 101.1 with saturations were 95% on room air.  He was found to be positive for rhinovirus and enterovirus with a CT of the chest suggestive of atypical pneumonia. ? ?Consultants:  ?None ? ?Code Status: NO CODE BLUE ? ?DVT prophylaxis: ?Lovenox ? ?Interim Hx: ?Afebrile.  Vital signs stable.  Saturation 100% on 5 L nasal cannula support.  No acute events reported overnight.  WBC has normalized.  Hemoglobin stable.  The patient is more sedated/less responsive today.  He appears tired.  He does awaken to my voice and denies new complaints, but he is quite stoic and not wanting to complain.  He does not appear to be an extremis but he is certainly less alert than yesterday. ? ?Assessment & Plan: ? ?Rhinovirus pneumonia +/- Superimposed bacterial pneumonia  ?Suspect combination of viral as well as bacterial PNA - cont empiric abx and supportive care -clinically improving -there is concern that he may have missed his azithromycin dose this morning therefore I am giving him an additional dose this afternoon ? ?Acute toxic metabolic encephalopathy ?Due to acute illness -CT head without acute findings - monitor with ongoing supportive care and treatment of acute infection -mental status has declined today which may simply be an indication of lethargy, but could  also be an initial sign of a downward turn -monitor as only time will tell ? ?Severe aortic stenosis ?Noted on TTE 2021-TTE this admission notes EF 65-70% with no WMA and grade 1 DD with severe AoVS - discussed progression to severe aortic stenosis with family and patient at bedside ? ?Chronic atrial fibrillation ?Rate controlled at this time ? ?Advanced right shoulder arthritis with effusion and calcified loose body ?Incidentally noted on CT chest -no acute issues presently ? ?Large hiatal hernia ?Incidentally noted on CT chest ? ? ?Family Communication: Spoke with daughter at bedside ?Disposition: From home -plan is for SNF placement if patient stabilizes/improves ? ?Objective: ?Blood pressure 129/84, pulse 94, temperature 98.4 ?F (36.9 ?C), temperature source Oral, resp. rate 19, SpO2 100 %. ? ?Intake/Output Summary (Last 24 hours) at 11/22/2021 1100 ?Last data filed at 11/22/2021 [id number] ?Gross per 24 hour  ?Intake --  ?Output 100 ml  ?Net -100 ml  ? ? ?There were no vitals filed for this visit. ? ?Examination: ?General: Less alert today, not in extremis ?Lungs: Fine crackles diffusely without change -coarse upper airway sounds transmitted throughout ?Cardiovascular: Irregularly irregular with rate controlled with 3/6 holosystolic murmur ?Abdomen: NT/ND, soft, BS positive, no rebound ?Extremities: Trace bilateral lower extremity edema  ? ?CBC: ?Recent Labs  ?Lab 11/19/21 ?1129 11/20/21 ?0308 11/21/21 ?0056 11/22/21 ?[id number]  ?WBC 11.7* 10.6* 9.8 8.2  ?NEUTROABS 10.4*  --   --   --   ?HGB 10.3* 10.7* 9.5* 10.3*  ?HCT 31.8* 32.0* 28.8* 31.5*  ?MCV  96.7 95.0 93.8 95.5  ?PLT 189 155 134* 146*  ? ? ?Basic Metabolic Panel: ?Recent Labs  ?Lab 11/19/21 ?1129 11/21/21 ?0056 11/22/21 ?[id number]  ?NA 140 138 133*  ?K 3.9 3.7 3.7  ?CL 106 109 101  ?CO2 26 22 23   ?GLUCOSE 125* 108* 119*  ?BUN 15 16 18   ?CREATININE 1.12 1.16 1.14  ?CALCIUM 9.0 8.3* 8.4*  ?MG  --   --  1.9  ? ? ?GFR: ?CrCl cannot be calculated (Unknown ideal  weight.). ? ?Liver Function Tests: ?Recent Labs  ?Lab 11/19/21 ?1129 11/22/21 ?[id number]  ?AST 20 33  ?ALT 10 13  ?ALKPHOS 79 59  ?BILITOT 1.0 0.6  ?PROT 6.8 5.6*  ?ALBUMIN 3.8 2.6*  ? ? ? ?Cardiac Enzymes: ?Recent Labs  ?Lab 11/19/21 ?1555  ?CKTOTAL 448*  ? ? ? ?HbA1C: ?Hgb A1c MFr Bld  ?Date/Time Value Ref Range Status  ?08/03/2016 05:29 AM 5.6 4.8 - 5.6 % Final  ?  Comment:  ?  (NOTE) ?        Pre-diabetes: 5.7 - 6.4 ?        Diabetes: >6.4 ?        Glycemic control for adults with diabetes: <7.0 ?  ? ? ?Scheduled Meds: ? aspirin EC  325 mg Oral Daily  ? azithromycin  500 mg Oral Daily  ? clopidogrel  75 mg Oral Daily  ? divalproex  250 mg Oral Daily  ? divalproex  500 mg Oral Daily  ? enoxaparin (LOVENOX) injection  40 mg Subcutaneous Q24H  ? multivitamin with minerals  1 tablet Oral Daily  ? saccharomyces boulardii  250 mg Oral BID  ? sodium chloride HYPERTONIC  4 mL Nebulization BID  ? vitamin B-12  1,000 mcg Oral Daily  ? ?Continuous Infusions: ? cefTRIAXone (ROCEPHIN)  IV 2 g (11/22/21 0956)  ? ? ? LOS: 2 days  ? ?Cherene Altes, MD ?[facility] ?Office  [phone] ?Pager - Text Page per Shea Evans ? ?If 7PM-7AM, please contact night-coverage per Amion ?11/22/2021, 11:00 AM ? ? ? ?

## 2021-11-22 NOTE — TOC Initial Note (Signed)
Transition of Care (TOC) - Initial/Assessment Note  ? ? ?Patient Details  ?Name: Nicholas Gaines ?MRN: [id number] ?Date of Birth: 1929/06/03 ? ?Transition of Care (TOC) CM/SW Contact:    ?Reece Agar, LCSWA ?Phone Number: ?11/22/2021, 11:19 AM ? ?Clinical Narrative:                 ?CSW received SNF consult. CSW met with pt daughter via phone. CSW introduced self and explained role at the hospital. Pt reports that PTA the pt lived at home alone with his daughter monitoring him through video. PT reports total care due to weakness. PT reports pt has a click score of 6 and that has DME at home. ? ?CSW reviewed PT/OT recommendations for SNF. Pt daughter stated that her and pt have agreed on pt going to a SNF bc of his weakness. Pt has been to [facility] in the past and would like to go back if possible. Pt gave CSW permission to fax out to facilities in the area.  ? ?CSW contacted [facility], admissions informed CSW that beds would be opening this week and they will review pt in their morning meeting tomorrow for bed offer.  ? ?CSW will continue to follow. ?  ? ?  ?  ? ? ?Patient Goals and CMS Choice ?  ?  ?  ? ?Expected Discharge Plan and Services ?  ?  ?  ?  ?  ?                ?  ?  ?  ?  ?  ?  ?  ?  ?  ?  ? ?Prior Living Arrangements/Services ?  ?  ?  ?       ?  ?  ?  ?  ? ?Activities of Daily Living ?  ?  ? ?Permission Sought/Granted ?  ?  ?   ?   ?   ?   ? ?Emotional Assessment ?  ?  ?  ?  ?  ?  ? ?Admission diagnosis:  Fever and chills [R50.9] ?Encephalopathy [G93.40] ?Fall, initial encounter [W19.XXXA] ?Fever, unspecified fever cause [R50.9] ?Pneumonia [J18.9] ?Patient Active Problem List  ? Diagnosis Date Noted  ? Pneumonia 11/20/2021  ? Fever and chills 11/19/2021  ? Sepsis ([place]) 11/19/2021  ? PNA (pneumonia) 11/19/2021  ? Acute bronchitis due to Rhinovirus 11/19/2021  ? Aspiration pneumonia ([place]) 11/19/2021  ? Syncope, vasovagal 11/19/2021  ? Aortic stenosis 11/19/2021  ? Syncope   ? Elevated CK   ?  Generalized weakness 08/09/2019  ? Seizures ([place]) 06/04/2019  ? Complex partial seizure with impairment of consciousness at onset [facility]) 05/29/2018  ? Mild cognitive impairment 05/29/2018  ? Palpitations 09/16/2016  ? Normocytic anemia 09/09/2016  ? Aortic valve calcification 09/09/2016  ? Aortic valve sclerosis 09/09/2016  ? BPH (benign prostatic hyperplasia) 09/09/2016  ? Cardiac murmur 09/09/2016  ? Essential (primary) hypertension 09/09/2016  ? Facial weakness status post cerebrovascular accident 09/09/2016  ? H/O: stroke with residual effects 09/09/2016  ? History of stroke 09/09/2016  ? Prediabetes 09/09/2016  ? Vitamin D deficiency 09/09/2016  ? Alteration of consciousness 08/14/2016  ? Acute encephalopathy 08/02/2016  ? Difficulty speaking 08/02/2016  ? Bronchitis 08/02/2016  ? HLD (hyperlipidemia) 08/02/2016  ? Stroke [facility])   ? GERD (gastroesophageal reflux disease)   ? Gastroesophageal reflux disease without esophagitis   ? Transient cerebral ischemia   ? ?PCP:  Antony Contras, MD ?Pharmacy:   ?[facility] [phone] -  [place], [address] ?[place] ?[place] [address] ?Phone: [phone] Fax: [phone] ? ? ? ? ?Social Determinants of Health (SDOH) Interventions ?  ? ?Readmission Risk Interventions ?   ? View : No data to display.  ?  ?  ?  ? ? ? ?

## 2021-11-22 NOTE — Consult Note (Signed)
WOC Nurse Consult Note: ?Reason for Consult: Stage 1 pressure injuries to bilateral buttocks ?Wound type:pressure ?Pressure Injury POA: Yes ?Measurement: ?Left buttock (stage 1):  4cm x 3cm erythematous, non blanching ?Right buttock:  3cm x 2cm ?Wound bed: N/A ?Drainage (amount, consistency, odor) None ?Periwound: intact, blanchable ?Dressing procedure/placement/frequency: ?Pressure injury prevention and treatment protocol interventions are implemented. These are available as Standing Orders, but I will enter on behalf of the Nursing staff. They are:  turning and repositioning from side to side and time in the supine position minimized, placement of a silicone foam dressing to the affected areas. This is to be peeled back each shift to assess the skin.  A pressure redistribution chair cushion is provided for the patient's OOB use in the chair and bilateral Pressure Redistribution heel boots are provided to prevent PI in those areas.  ? ?WOC nursing team will not follow, but will remain available to this patient, the nursing and medical teams.  Please re-consult if needed. ?Thanks, ?Ladona Mow, MSN, RN, GNP, CWOCN, CWON-AP, FAAN  ?Pager# [phone]  ? ?  ?

## 2021-11-23 DIAGNOSIS — R509 Fever, unspecified: Secondary | ICD-10-CM | POA: Diagnosis not present

## 2021-11-23 LAB — BASIC METABOLIC PANEL
Anion gap: 7 (ref 5–15)
BUN: 15 mg/dL (ref 8–23)
CO2: 24 mmol/L (ref 22–32)
Calcium: 8.3 mg/dL — ABNORMAL LOW (ref 8.9–10.3)
Chloride: 104 mmol/L (ref 98–111)
Creatinine, Ser: 1.02 mg/dL (ref 0.61–1.24)
GFR, Estimated: >60 mL/min (ref >60)
Glucose, Bld: 108 mg/dL — ABNORMAL HIGH (ref 70–99)
Potassium: 4 mmol/L (ref 3.5–5.1)
Sodium: 135 mmol/L (ref 135–145)

## 2021-11-23 LAB — CBC
HCT: 28.8 % — ABNORMAL LOW (ref 39.0–52.0)
Hemoglobin: 9.4 g/dL — ABNORMAL LOW (ref 13.0–17.0)
MCH: 30.8 pg (ref 26.0–34.0)
MCHC: 32.6 g/dL (ref 30.0–36.0)
MCV: 94.4 fL (ref 80.0–100.0)
Platelets: 160 10*3/uL (ref 150–400)
RBC: 3.05 MIL/uL — ABNORMAL LOW (ref 4.22–5.81)
RDW: 13.3 % (ref 11.5–15.5)
WBC: 8.2 10*3/uL (ref 4.0–10.5)
nRBC: 0 % (ref 0.0–0.2)

## 2021-11-23 LAB — PROCALCITONIN: Procalcitonin: 9.19 ng/mL

## 2021-11-23 NOTE — TOC Progression Note (Signed)
Transition of Care (TOC) - Progression Note  ? ? ?Patient Details  ?Name: Nicholas Gaines ?MRN: [id number] ?Date of Birth: 1929-06-07 ? ?Transition of Care (TOC) CM/SW Contact  ?Emeterio Reeve, LCSW ?Phone Number: ?11/23/2021, 4:09 PM ? ?Clinical Narrative:    ? ?CSW spoke with pt and daughter at bedside. Daughter stated pt has been to [facility] before and would like to return. [facility] confirmed they can take pt at DC. [facility] will start insurance auth.  ? ?Expected Discharge Plan: [place] ?Barriers to Discharge: Continued Medical Work up, Ship broker ? ?Expected Discharge Plan and Services ?Expected Discharge Plan: [place] ?  ?  ?  ?Living arrangements for the past 2 months: [place] ?                ?  ?  ?  ?  ?  ?  ?  ?  ?  ?  ? ? ?Social Determinants of Health (SDOH) Interventions ?  ? ?Readmission Risk Interventions ?   ? View : No data to display.  ?  ?  ?  ? ?Emeterio Reeve, LCSW ?Clinical Social Worker ? ?

## 2021-11-23 NOTE — Progress Notes (Signed)
Old pressure ulcer stage 1 found on patient's buttocks during peri-care. This seems old and not sure whether patient had it PTA, this was not reported from outgoing RN. Barrier cream applied to perineal area and Foam dressing applied as well. Patient refused to be off his buttocks as he said he was comfortable in his precious position.  ?

## 2021-11-23 NOTE — Progress Notes (Signed)
? Nicholas Gaines  [id number] DOB: 1928/10/03 DOA: 11/19/2021 ?PCP: Antony Contras, MD   ? ?Brief Narrative:  ?88yo with a history of moderate aortic stenosis via echo 2021, chronic atrial fibrillation, HLD, seizure disorder, and CVA who presented to the ED with severe generalized weakness and a possible syncopal spell.  The patient lives alone but his family monitors him with a ring camera in the home and noted that he had slipped off his bed onto the ground around 730 the morning of his admission.  Family arrived and the patient was alert but confused is still laying on the ground.  EMS was summoned.  In the ER the patient was found to have a temperature of 101.1 with saturations were 95% on room air.  He was found to be positive for rhinovirus and enterovirus with a CT of the chest suggestive of atypical pneumonia. ? ?Consultants:  ?None ? ?Code Status: NO CODE BLUE ? ?DVT prophylaxis: ?Lovenox ? ?Interim Hx: ?Afebrile.  Vital signs stable.  Saturations 100% on 5-6 L nasal cannula oxygen support.  Appears to be having a slightly better day today.  Is more alert.  No evidence of acute distress though upper airway sounds continued to suggest significant secretions.  Denies complaints, though he is a stoic individual. ? ?Assessment & Plan: ? ?Rhinovirus pneumonia +/- Superimposed bacterial pneumonia  ?Suspect combination of viral as well as bacterial PNA - cont empiric abx and supportive care -continue to monitor now to allow him to declare if he is going to continue in this positive improving trend ? ?Acute toxic metabolic encephalopathy ?Due to acute illness -CT head without acute findings - monitor with ongoing supportive care and treatment of acute infection -mental status waxing and waning, which is not surprising given his condition, but he is improved today -continue to monitor ? ?Severe aortic stenosis ?Noted on TTE 2021-TTE this admission notes EF 65-70% with no WMA and grade 1 DD with severe AoVS -  discussed progression to severe aortic stenosis with family and patient at bedside ? ?Chronic atrial fibrillation ?Rate controlled at this time ? ?Advanced right shoulder arthritis with effusion and calcified loose body ?Incidentally noted on CT chest -no acute issues presently ? ?Large hiatal hernia ?Incidentally noted on CT chest ? ? ?Family Communication: Spoke with daughter at bedside ?Disposition: From home -plan is for SNF placement if patient continues to improve and stabilizes further ? ?Objective: ?Blood pressure 122/69, pulse 77, temperature 98.3 ?F (36.8 ?C), temperature source Oral, resp. rate 16, SpO2 100 %. ? ?Intake/Output Summary (Last 24 hours) at 11/23/2021 1015 ?Last data filed at 11/23/2021 [id number] ?Gross per 24 hour  ?Intake 910 ml  ?Output 600 ml  ?Net 310 ml  ? ? ?There were no vitals filed for this visit. ? ?Examination: ?General: More alert today, not in extremis ?Lungs: Fine crackles diffusely without change -coarse upper airway sounds transmitted throughout as per yesterday ?Cardiovascular: Irregularly irregular with rate controlled - 3/6 holosystolic murmur ?Abdomen: NT/ND, soft, BS positive, no rebound ?Extremities: Trace bilateral lower extremity edema  ? ?CBC: ?Recent Labs  ?Lab 11/19/21 ?1129 11/20/21 ?0308 11/21/21 ?0056 11/22/21 ?[id number] 11/23/21 ?0119  ?WBC 11.7*   < > 9.8 8.2 8.2  ?NEUTROABS 10.4*  --   --   --   --   ?HGB 10.3*   < > 9.5* 10.3* 9.4*  ?HCT 31.8*   < > 28.8* 31.5* 28.8*  ?MCV 96.7   < > 93.8 95.5 94.4  ?PLT 189   < >  134* 146* 160  ? < > = values in this interval not displayed.  ? ? ?Basic Metabolic Panel: ?Recent Labs  ?Lab 11/21/21 ?0056 11/22/21 ?[id number] 11/23/21 ?0119  ?NA 138 133* 135  ?K 3.7 3.7 4.0  ?CL 109 101 104  ?CO2 22 23 24   ?GLUCOSE 108* 119* 108*  ?BUN 16 18 15   ?CREATININE 1.16 1.14 1.02  ?CALCIUM 8.3* 8.4* 8.3*  ?MG  --  1.9  --   ? ? ?GFR: ?CrCl cannot be calculated (Unknown ideal weight.). ? ?Liver Function Tests: ?Recent Labs  ?Lab 11/19/21 ?1129  11/22/21 ?[id number]  ?AST 20 33  ?ALT 10 13  ?ALKPHOS 79 59  ?BILITOT 1.0 0.6  ?PROT 6.8 5.6*  ?ALBUMIN 3.8 2.6*  ? ? ? ?Cardiac Enzymes: ?Recent Labs  ?Lab 11/19/21 ?1555  ?CKTOTAL 448*  ? ? ? ?HbA1C: ?Hgb A1c MFr Bld  ?Date/Time Value Ref Range Status  ?08/03/2016 05:29 AM 5.6 4.8 - 5.6 % Final  ?  Comment:  ?  (NOTE) ?        Pre-diabetes: 5.7 - 6.4 ?        Diabetes: >6.4 ?        Glycemic control for adults with diabetes: <7.0 ?  ? ? ?Scheduled Meds: ? azithromycin  500 mg Oral Daily  ? clopidogrel  75 mg Oral Daily  ? divalproex  250 mg Oral Daily  ? divalproex  500 mg Oral Daily  ? enoxaparin (LOVENOX) injection  40 mg Subcutaneous Q24H  ? multivitamin with minerals  1 tablet Oral Daily  ? saccharomyces boulardii  250 mg Oral BID  ? vitamin B-12  1,000 mcg Oral Daily  ? ?Continuous Infusions: ? cefTRIAXone (ROCEPHIN)  IV Stopped (11/22/21 1026)  ? ? ? LOS: 3 days  ? ?Cherene Altes, MD ?[facility] ?Office  [phone] ?Pager - Text Page per Shea Evans ? ?If 7PM-7AM, please contact night-coverage per Amion ?11/23/2021, 10:15 AM ? ? ? ?

## 2021-11-24 DIAGNOSIS — R509 Fever, unspecified: Secondary | ICD-10-CM | POA: Diagnosis not present

## 2021-11-24 LAB — CULTURE, BLOOD (ROUTINE X 2)
Culture: NO GROWTH
Culture: NO GROWTH
Special Requests: ADEQUATE

## 2021-11-24 MED ORDER — ACETAMINOPHEN 325 MG PO TABS: 650.0000 mg | ORAL_TABLET | Freq: Four times a day (QID) | ORAL | Status: AC | PRN

## 2021-11-24 MED ORDER — SACCHAROMYCES BOULARDII 250 MG PO CAPS
250.0000 mg | ORAL_CAPSULE | Freq: Two times a day (BID) | ORAL | 0 refills | Status: AC
Start: 2021-11-25 — End: 2021-11-30

## 2021-11-24 MED ORDER — IPRATROPIUM-ALBUTEROL 0.5-2.5 (3) MG/3ML IN SOLN
3.0000 mL | Freq: Four times a day (QID) | RESPIRATORY_TRACT | Status: AC | PRN
Start: 2021-11-24 — End: ?

## 2021-11-24 NOTE — Progress Notes (Signed)
Physical Therapy Treatment ?Patient Details ?Name: Nicholas Gaines ?MRN: [id number] ?DOB: 1928/12/11 ?Today's Date: 11/24/2021 ? ? ?History of Present Illness Pt adm 4/13 with weakness and fall. Pt with sepsis due to PNA. PMH - severe arthritis, afib, seizures, stroke, aortic stenosis. ? ?  ?PT Comments  ? ? Pt received supine and agreeable to session with fair progress towards goals. Pt able to come to sitting EOB with mod assist for trunk elevation and use of bedpad to scoot toward EOB once sitting. With use of stedy, pt able to come to standing x3 trials with mod assist down to min assist with task practice. Pt transferred to recliner via steady, demonstrating fair eccentric control on sitting descent. Pt agreeable to time up in chair at end of session. Educated pt re; AP x20/hr with pt and family verbalizing understanding. Current plan remains appropriate to address deficits and maximize functional independence and decrease caregiver burden. Pt continues to benefit from skilled PT services to progress toward functional mobility goals.  ?   ?Recommendations for follow up therapy are one component of a multi-disciplinary discharge planning process, led by the attending physician.  Recommendations may be updated based on patient status, additional functional criteria and insurance authorization. ? ?Follow Up Recommendations ? Skilled nursing-short term rehab (<3 hours/day) ?  ?  ?Assistance Recommended at Discharge Frequent or constant Supervision/Assistance  ?Patient can return home with the following Two people to help with walking and/or transfers;Two people to help with bathing/dressing/bathroom;Assistance with feeding;Assistance with cooking/housework;Direct supervision/assist for financial management;Direct supervision/assist for medications management;Assist for transportation ?  ?Equipment Recommendations ? None recommended by PT  ?  ?Recommendations for Other Services   ? ? ?  ?Precautions / Restrictions  Precautions ?Precautions: Fall ?Restrictions ?Weight Bearing Restrictions: No  ?  ? ?Mobility ? Bed Mobility ?Overal bed mobility: Needs Assistance ?Bed Mobility: Supine to Sit ?  ?  ?Supine to sit: Mod assist ?  ?  ?General bed mobility comments: mod a to elevqate trunk and use of bedpad to scoot toward EOB ?  ? ?Transfers ?Overall transfer level: Needs assistance ?Equipment used: Ambulation equipment used ?Transfers: Sit to/from Stand ?Sit to Stand: Mod assist, Min assist ?  ?  ?  ?  ?  ?General transfer comment: mod assist to initial stand x1, min assist x2 with stedy, multimodal cues to bring hips toward rail ?  ? ?Ambulation/Gait ?  ?  ?  ?  ?  ?  ?  ?General Gait Details: unable ? ? ?Stairs ?  ?  ?  ?  ?  ? ? ?Wheelchair Mobility ?  ? ?Modified Rankin (Stroke Patients Only) ?  ? ? ?  ?Balance Overall balance assessment: History of Falls ?Sitting-balance support: Bilateral upper extremity supported, Feet supported ?Sitting balance-Leahy Scale: Fair ?Sitting balance - Comments: Pt sat EOB x5 minutes with min assist as pt with tendency for posterior lean ?Postural control: Posterior lean ?  ?  ?  ?  ?  ?  ?  ?  ?  ?  ?  ?  ?  ?  ?  ? ?  ?Cognition Arousal/Alertness: Lethargic ?Behavior During Therapy: Flat affect (making conversation at end of session) ?Overall Cognitive Status: Impaired/Different from baseline ?Area of Impairment: Following commands, Memory, Attention ?  ?  ?  ?  ?  ?  ?  ?  ?  ?  ?  ?Following Commands: Follows one step commands inconsistently, Follows one step commands with increased time ?  ?  ?  Problem Solving: Slow processing, Requires verbal cues, Requires tactile cues, Difficulty sequencing, Decreased initiation ?  ?  ?  ? ?  ?Exercises General Exercises - Lower Extremity ?Ankle Circles/Pumps: AROM, Both, 20 reps, Seated (long sitting) ? ?  ?General Comments   ?  ?  ? ?Pertinent Vitals/Pain Pain Assessment ?Pain Assessment: No/denies pain  ? ? ?Home Living   ?  ?  ?  ?  ?  ?  ?  ?  ?  ?    ?  ?Prior Function    ?  ?  ?   ? ?PT Goals (current goals can now be found in the care plan section) Acute Rehab PT Goals ?Patient Stated Goal: Not stated ?PT Goal Formulation: With patient/family ?Time For Goal Achievement: 12/04/21 ? ?  ?Frequency ? ? ? Min 2X/week ? ? ? ?  ?PT Plan    ? ? ?Co-evaluation   ?  ?  ?  ?  ? ?  ?AM-PAC PT "6 Clicks" Mobility   ?Outcome Measure ? Help needed turning from your back to your side while in a flat bed without using bedrails?: Total ?Help needed moving from lying on your back to sitting on the side of a flat bed without using bedrails?: Total ?Help needed moving to and from a bed to a chair (including a wheelchair)?: A Lot ?Help needed standing up from a chair using your arms (e.g., wheelchair or bedside chair)?: A Lot ?Help needed to walk in hospital room?: Total ?Help needed climbing 3-5 steps with a railing? : Total ?6 Click Score: 8 ? ?  ?End of Session Equipment Utilized During Treatment: Oxygen ?Activity Tolerance: Patient limited by fatigue ?Patient left: in chair;with call bell/phone within reach;with family/visitor present ?Nurse Communication: Mobility status ?PT Visit Diagnosis: Other abnormalities of gait and mobility (R26.89);Muscle weakness (generalized) (M62.81);History of falling (Z91.81);Pain ?Pain - part of body: Shoulder;Hip ?  ? ? ?Time: [id number] ?PT Time Calculation (min) (ACUTE ONLY): 33 min ? ?Charges:  $Therapeutic Exercise: 8-22 mins ?$Therapeutic Activity: 8-22 mins          ?          ? ?Nicholas Gaines. PTA ?Acute Rehabilitation Services ?Office: [phone] ? ? ? ?Nicholas Gaines ?11/24/2021, 1:11 PM ? ?

## 2021-11-24 NOTE — Discharge Summary (Signed)
?DISCHARGE SUMMARY ? ?Nicholas Gaines ? ?MR#: [id number] ? ?DOB:06/22/1929  ?Date of Admission: 11/19/2021 ?Date of Discharge: 11/24/2021 ? ?Attending Physician:Zakariye Nee Silvestre Gunner Akshaj Besancon, MD ? ?Patient's WUJ:Nicholas Gaines:Nicholas Gaines, Nicholas Huaavid, MD ? ?Consults: None ? ?Disposition: D/C to SNF  ? ?Follow-up Appts: ? Follow-up Information   ? ? Tally JoeSwayne, David, MD.   ?Specialty: Family Medicine ?Why: As needed ?Contact information: ?[address] ?[address] ?[place] [id number] ?[phone] ? ? ?  ?  ? ?  ?  ? ?  ? ? ?Tests Needing Follow-up: ?-Monitor sacrum/buttocks, and bilateral heels regularly in attempts to prevent skin breakdown leading to actual ulceration (no ulcerations present at time of discharge) ?-Monitor overall functional status ?-Monitor respiratory status for evidence of decline/recrudescence of symptoms ? ?Discharge Diagnoses: ?Rhinovirus pneumonia + Superimposed bacterial pneumonia  ?Acute toxic metabolic encephalopathy ?Severe aortic stenosis ?Chronic atrial fibrillation ?Advanced right shoulder arthritis with effusion and calcified loose body ?Large hiatal hernia ? ?Initial presentation: ?88yo with a history of moderate aortic stenosis via echo 2021, chronic atrial fibrillation, HLD, seizure disorder, and CVA who presented to the ED with severe generalized weakness and a possible syncopal spell.  The patient lives alone but his family monitors him with a ring camera in the home and noted that he had slipped off his bed onto the floor around 730 the morning of his admission.  Family arrived and the patient was alert but confused and still laying on the floor.  EMS was summoned.  In the ER the patient was found to have a temperature of 101.1 with saturations 95% on room air.  He was found to be positive for rhinovirus with a CT of the chest suggestive of atypical pneumonia. ? ?Hospital Course: ?  ?Rhinovirus pneumonia + Superimposed bacterial pneumonia  ?Suspect combination of viral as well as bacterial PNA - continued empiric  abx and supportive care to complete 7 days of tx -has made slow but measurable progress during his hospital stay -it is recognized given his advanced age and the serious nature of his infection that his improvement could stalled and he could even began to decline further -this has been discussed very frankly with the patient and his family -we are however encouraged at this time that he has improved and hopeful that with ongoing therapy at his skilled nursing facility this trend will continue ?  ?Acute toxic metabolic encephalopathy ?Due to acute illness -CT head without acute findings - with ongoing supportive care and treatment of acute infection his mental status did improve significantly after waxing and waning during the initial portion of his hospital stay -at discharge he is back to his baseline ?  ?Severe aortic stenosis ?Noted on TTE 2021-TTE this admission notes EF 65-70% with no WMA and grade 1 DD with severe AoVS - discussed progression to severe aortic stenosis with family and patient at bedside -patient is not a candidate for intervention and is presently tolerating this well hemodynamically ?  ?Chronic atrial fibrillation ?Rate controlled with no significant RVR encountered during his hospital stay ?  ?Advanced right shoulder arthritis with effusion and calcified loose body ?Incidentally noted on CT chest -no acute issues presently ?  ?Large hiatal hernia ?Incidentally noted on CT chest ? ?Stage I pressure injuries to bilateral buttocks ?Appreciated during hospital stay -WOC consulted to manage -turn and reposition from side to side as able minimizing time in supine position -utilize silicone foam dressing to the affected area which should be peeled back each shift to assess skin underneath -utilize  chair cushion for pressure redistribution -also continue pressure redistribution heel boots bilaterally as patient is at extremely high risk for progression of pressure injury to severe  ulceration ? ?Coarse upper airway sounds -difficulty with secretions ?Throughout the course of the patient's hospital stay he was noted to have coarse upper airway sounds, and exhibited some difficulty handling his secretions -this is likely multifactorial to include a significant hiatal hernia with probable reflux as well as generalized deconditioning/weakness leading to difficulty handling secretions -he required NTS suctioning intermittently for his comfort -modification of his diet is felt to be of no significant utility in addressing this issue and would only lessen his quality of life ? ? ?Allergies as of 11/24/2021   ? ?   Reactions  ? Rocephin [ceftriaxone Sodium In Dextrose] Other (See Comments)  ? C-Diff  ? Atorvastatin   ? Other reaction(s): myalgia  ? Pravastatin   ? Other reaction(s): myalgia  ? Rosuvastatin Calcium   ? Other reaction(s): fatigued  ? ?  ? ?  ?Medication List  ?  ? ?STOP taking these medications   ? ?acetaminophen 650 MG CR tablet ?Commonly known as: TYLENOL ?Replaced by: acetaminophen 325 MG tablet ?  ?amLODipine 10 MG tablet ?Commonly known as: NORVASC ?  ?amLODipine 5 MG tablet ?Commonly known as: NORVASC ?  ?aspirin EC 325 MG tablet ?  ?famotidine 20 MG tablet ?Commonly known as: PEPCID ?  ?ferrous sulfate 325 (65 FE) MG EC tablet ?  ?GLUCOSAMINE CHONDR 1500 COMPLX PO ?  ?Zetia 10 MG tablet ?Generic drug: ezetimibe ?  ? ?  ? ?TAKE these medications   ? ?acetaminophen 325 MG tablet ?Commonly known as: TYLENOL ?Take 2 tablets (650 mg total) by mouth every 6 (six) hours as needed for headache, fever, moderate pain or mild pain. ?Replaces: acetaminophen 650 MG CR tablet ?  ?Cholecalciferol 25 MCG (1000 UT) capsule ?Take 1 capsule (1,000 Units total) by mouth daily. ?  ?clopidogrel 75 MG tablet ?Commonly known as: PLAVIX ?Take 1 tablet (75 mg total) by mouth daily. ?  ?divalproex 250 MG 24 hr tablet ?Commonly known as: DEPAKOTE ER ?TAKE 1 TABLET BY MOUTH EVERY DAY WITH 500MG  TABLET FOR A  TOTAL OF 750MG  ?What changed:  ?how much to take ?how to take this ?when to take this ?additional instructions ?  ?divalproex 500 MG 24 hr tablet ?Commonly known as: DEPAKOTE ER ?TAKE 1 TABLET BY MOUTH EVERY DAY WITH 250MG  TABLET FOR TOTAL OF 750MG  ?What changed: Another medication with the same name was changed. Make sure you understand how and when to take each. ?  ?Fish Oil 1000 MG Caps ?Take 2,000 mg by mouth daily. ?  ?ipratropium-albuterol 0.5-2.5 (3) MG/3ML Soln ?Commonly known as: DUONEB ?Take 3 mLs by nebulization every 6 (six) hours as needed. ?  ?multivitamin tablet ?Take 1 tablet by mouth daily. ?  ?saccharomyces boulardii 250 MG capsule ?Commonly known as: FLORASTOR ?Take 1 capsule (250 mg total) by mouth 2 (two) times daily for 5 days. ?Start taking on: November 25, 2021 ?  ?vitamin B-12 1000 MCG tablet ?Commonly known as: CYANOCOBALAMIN ?Take 1 tablet (1,000 mcg total) by mouth daily. ?  ? ?  ? ? ?Day of Discharge ?BP 122/82 (BP Location: Left Arm)   Pulse 94   Temp 97.7 ?F (36.5 ?C) (Axillary)   Resp 19   SpO2 100%  ? ?Physical Exam: ?General: No acute respiratory distress ?Lungs: Mild bibasilar crackles with no wheezing -coarse upper airway sounds transmitted throughout all  fields ?Cardiovascular: Regular rate and rhythm with 3/6 systolic murmur ?Abdomen: Nontender, nondistended, soft, bowel sounds positive, no rebound, no ascites, no appreciable mass ?Extremities: trace bilateral lower extremity edema ? ?Basic Metabolic Panel: ?Recent Labs  ?Lab 11/19/21 ?1129 11/21/21 ?0056 11/22/21 ?3546 11/23/21 ?0119  ?NA 140 138 133* 135  ?K 3.9 3.7 3.7 4.0  ?CL 106 109 101 104  ?CO2 26 22 23 24   ?GLUCOSE 125* 108* 119* 108*  ?BUN 15 16 18 15   ?CREATININE 1.12 1.16 1.14 1.02  ?CALCIUM 9.0 8.3* 8.4* 8.3*  ?MG  --   --  1.9  --   ? ? ?Liver Function Tests: ?Recent Labs  ?Lab 11/19/21 ?1129 11/22/21 ?11/21/21  ?AST 20 33  ?ALT 10 13  ?ALKPHOS 79 59  ?BILITOT 1.0 0.6  ?PROT 6.8 5.6*  ?ALBUMIN 3.8 2.6*   ? ? ?CBC: ?Recent Labs  ?Lab 11/19/21 ?1129 11/20/21 ?0308 11/21/21 ?0056 11/22/21 ?11/23/21 11/23/21 ?0119  ?WBC 11.7* 10.6* 9.8 8.2 8.2  ?NEUTROABS 10.4*  --   --   --   --   ?HGB 10.3* 10.7* 9.5* 10.3* 9.4*  ?HCT 31.8* 32.0* 28.8* 31.5* 28.

## 2021-11-25 DIAGNOSIS — K219 Gastro-esophageal reflux disease without esophagitis: Secondary | ICD-10-CM | POA: Diagnosis not present

## 2021-11-25 DIAGNOSIS — M6281 Muscle weakness (generalized): Secondary | ICD-10-CM | POA: Diagnosis not present

## 2021-11-25 DIAGNOSIS — R55 Syncope and collapse: Secondary | ICD-10-CM | POA: Diagnosis not present

## 2021-11-25 DIAGNOSIS — G40219 Localization-related (focal) (partial) symptomatic epilepsy and epileptic syndromes with complex partial seizures, intractable, without status epilepticus: Secondary | ICD-10-CM | POA: Diagnosis not present

## 2021-11-25 DIAGNOSIS — E785 Hyperlipidemia, unspecified: Secondary | ICD-10-CM | POA: Diagnosis not present

## 2021-11-25 DIAGNOSIS — R2681 Unsteadiness on feet: Secondary | ICD-10-CM | POA: Diagnosis not present

## 2021-11-25 DIAGNOSIS — L89152 Pressure ulcer of sacral region, stage 2: Secondary | ICD-10-CM | POA: Diagnosis not present

## 2021-11-25 DIAGNOSIS — I69891 Dysphagia following other cerebrovascular disease: Secondary | ICD-10-CM | POA: Diagnosis not present

## 2021-11-25 DIAGNOSIS — D649 Anemia, unspecified: Secondary | ICD-10-CM | POA: Diagnosis not present

## 2021-11-25 DIAGNOSIS — M109 Gout, unspecified: Secondary | ICD-10-CM | POA: Diagnosis not present

## 2021-11-25 DIAGNOSIS — I69351 Hemiplegia and hemiparesis following cerebral infarction affecting right dominant side: Secondary | ICD-10-CM | POA: Diagnosis not present

## 2021-11-25 DIAGNOSIS — M1990 Unspecified osteoarthritis, unspecified site: Secondary | ICD-10-CM | POA: Diagnosis not present

## 2021-11-25 DIAGNOSIS — I69828 Other speech and language deficits following other cerebrovascular disease: Secondary | ICD-10-CM | POA: Diagnosis not present

## 2021-11-25 DIAGNOSIS — J206 Acute bronchitis due to rhinovirus: Secondary | ICD-10-CM | POA: Diagnosis not present

## 2021-11-25 DIAGNOSIS — R2689 Other abnormalities of gait and mobility: Secondary | ICD-10-CM | POA: Diagnosis not present

## 2021-11-25 DIAGNOSIS — R279 Unspecified lack of coordination: Secondary | ICD-10-CM | POA: Diagnosis not present

## 2021-11-25 DIAGNOSIS — I4891 Unspecified atrial fibrillation: Secondary | ICD-10-CM | POA: Diagnosis not present

## 2021-11-25 DIAGNOSIS — J189 Pneumonia, unspecified organism: Secondary | ICD-10-CM | POA: Diagnosis not present

## 2021-11-25 NOTE — Progress Notes (Signed)
PTAR at bedside for transport to SNF via gurney. All pt belongings and DC packet at pt side. Pt remains awake and alert at baseline; stable with even and unlabored breathing on 2LNC with SpO2 93%. ?

## 2021-11-25 NOTE — Progress Notes (Signed)
Report called to [facility] [phone] ?

## 2021-11-25 NOTE — TOC Transition Note (Addendum)
Transition of Care (TOC) - CM/SW Discharge Note ? ? ?Patient Details  ?Name: Nicholas Gaines ?MRN: [id number] ?Date of Birth: 1928-09-23 ? ?Transition of Care (TOC) CM/SW Contact:  ?Jimmy Picket, LCSW ?Phone Number: ?11/25/2021, 11:04 AM ? ? ?Clinical Narrative:    ? ?Per MD patient ready for DC to [facility]. RN, patient, patient's family, and facility notified of DC. Discharge Summary and FL2 sent to facility. DC packet on chart. Insurance Berkley Harvey has been received.  Ambulance transport requested for patient.  ?  ?RN to call report to [phone].  ? ?CSW will sign off for now as social work intervention is no longer needed. Please consult [place] again if new needs arise. ? ? ?Final next level of care: Skilled Nursing Facility ?Barriers to Discharge: Barriers Resolved ? ? ?Patient Goals and CMS Choice ?Patient states their goals for this hospitalization and ongoing recovery are:: TO get stronger ?CMS Medicare.gov Compare Post Acute Care list provided to:: Patient ?Choice offered to / list presented to : Patient ? ?Discharge Placement ?  ?           ?Patient chooses bed at: [facility] ?Patient to be transferred to facility by: [facility] ?Name of family member notified: daughter ?Patient and family notified of of transfer: 11/25/21 ? ?Discharge Plan and Services ?  ?  ?           ?  ?  ?  ?  ?  ?  ?  ?  ?  ?  ? ?Social Determinants of Health (SDOH) Interventions ?  ? ? ?Readmission Risk Interventions ?   ? View : No data to display.  ?  ?  ?  ? ?Jimmy Picket, LCSW ?Clinical Social Worker ? ? ? ? ?

## 2021-11-25 NOTE — Discharge Summary (Signed)
?DISCHARGE SUMMARY ? ?Nicholas Gaines ? ?MR#: [id number] ? ?DOB:August 11, 1928  ?Date of Admission: 11/19/2021 ?Date of Discharge: 11/25/2021 ? ?Attending Physician:Nicholas Gear Blake Divine, Gaines ? ?Patient's PRF:[name], Nicholas Gaines ? ?Consults: None ? ?Disposition: D/C to SNF  ? ?Follow-up Appts: ? Follow-up Information   ? ? [name], Gaines.   ?Specialty: Family Medicine ?Why: As needed ?Contact information: ?[address] ?[address] ?[place] [address] ?[phone] ? ? ?  ?  ? ?  ?  ? ?  ? ? ?Tests Needing Follow-up: ?-Monitor sacrum/buttocks, and bilateral heels regularly in attempts to prevent skin breakdown leading to actual ulceration (no ulcerations present at time of discharge) ?-Monitor overall functional status ?-Monitor respiratory status for evidence of decline/recrudescence of symptoms ? ?Discharge Diagnoses: ?Rhinovirus pneumonia + Superimposed bacterial pneumonia  ?Acute toxic metabolic encephalopathy ?Severe aortic stenosis ?Chronic atrial fibrillation ?Advanced right shoulder arthritis with effusion and calcified loose body ?Large hiatal hernia ? ?Initial presentation: ?88yo with a history of moderate aortic stenosis via echo 2021, chronic atrial fibrillation, HLD, seizure disorder, and CVA who presented to the ED with severe generalized weakness and a possible syncopal spell.  The patient lives alone but his family monitors him with a ring camera in the home and noted that he had slipped off his bed onto the floor around 730 the morning of his admission.  Family arrived and the patient was alert but confused and still laying on the floor.  EMS was summoned.  In the ER the patient was found to have a temperature of 101.1 with saturations 95% on room air.  He was found to be positive for rhinovirus with a CT of the chest suggestive of atypical pneumonia. ? ?Hospital Course: ?  ?Rhinovirus pneumonia + Superimposed bacterial pneumonia  ?Suspect combination of viral as well as bacterial PNA - continued empiric abx  and supportive care to complete 7 days of tx -has made slow but measurable progress during his hospital stay -it is recognized given his advanced age and the serious nature of his infection that his improvement could stalled and he could even began to decline further -this has been discussed very frankly with the patient and his family -we are however encouraged at this time that he has improved and hopeful that with ongoing therapy at his skilled nursing facility this trend will continue ?  ?Acute toxic metabolic encephalopathy ?Due to acute illness -CT head without acute findings - with ongoing supportive care and treatment of acute infection his mental status did improve significantly after waxing and waning during the initial portion of his hospital stay -at discharge he is back to his baseline ?  ?Severe aortic stenosis ?Noted on TTE 2021-TTE this admission notes EF 65-70% with no WMA and grade 1 DD with severe AoVS - discussed progression to severe aortic stenosis with family and patient at bedside -patient is not a candidate for intervention and is presently tolerating this well hemodynamically ?  ?Chronic atrial fibrillation ?Rate controlled with no significant RVR encountered during his hospital stay ?  ?Advanced right shoulder arthritis with effusion and calcified loose body ?Incidentally noted on CT chest -no acute issues presently ?  ?Large hiatal hernia ?Incidentally noted on CT chest ? ?Stage I pressure injuries to bilateral buttocks ?Appreciated during hospital stay -WOC consulted to manage -turn and reposition from side to side as able minimizing time in supine position -utilize silicone foam dressing to the affected area which should be peeled back each shift to assess skin underneath -utilize chair  cushion for pressure redistribution -also continue pressure redistribution heel boots bilaterally as patient is at extremely high risk for progression of pressure injury to severe ulceration ? ?Coarse  upper airway sounds -difficulty with secretions ?Throughout the course of the patient's hospital stay he was noted to have coarse upper airway sounds, and exhibited some difficulty handling his secretions -this is likely multifactorial to include a significant hiatal hernia with probable reflux as well as generalized deconditioning/weakness leading to difficulty handling secretions -he required NTS suctioning intermittently for his comfort -modification of his diet is felt to be of no significant utility in addressing this issue and would only lessen his quality of life ? ? ?Allergies as of 11/25/2021   ? ?   Reactions  ? Rocephin [ceftriaxone Sodium In Dextrose] Other (See Comments)  ? C-Diff  ? Atorvastatin   ? Other reaction(s): myalgia  ? Pravastatin   ? Other reaction(s): myalgia  ? Rosuvastatin Calcium   ? Other reaction(s): fatigued  ? ?  ? ?  ?Medication List  ?  ? ?STOP taking these medications   ? ?acetaminophen 650 MG CR tablet ?Commonly known as: TYLENOL ?Replaced by: acetaminophen 325 MG tablet ?  ?amLODipine 10 MG tablet ?Commonly known as: NORVASC ?  ?amLODipine 5 MG tablet ?Commonly known as: NORVASC ?  ?aspirin EC 325 MG tablet ?  ?famotidine 20 MG tablet ?Commonly known as: PEPCID ?  ?ferrous sulfate 325 (65 FE) MG EC tablet ?  ?GLUCOSAMINE CHONDR 1500 COMPLX PO ?  ?Zetia 10 MG tablet ?Generic drug: ezetimibe ?  ? ?  ? ?TAKE these medications   ? ?acetaminophen 325 MG tablet ?Commonly known as: TYLENOL ?Take 2 tablets (650 mg total) by mouth every 6 (six) hours as needed for headache, fever, moderate pain or mild pain. ?Replaces: acetaminophen 650 MG CR tablet ?  ?Cholecalciferol 25 MCG (1000 UT) capsule ?Take 1 capsule (1,000 Units total) by mouth daily. ?  ?clopidogrel 75 MG tablet ?Commonly known as: PLAVIX ?Take 1 tablet (75 mg total) by mouth daily. ?  ?divalproex 250 MG 24 hr tablet ?Commonly known as: DEPAKOTE ER ?TAKE 1 TABLET BY MOUTH EVERY DAY WITH 500MG  TABLET FOR A TOTAL OF 750MG  ?What  changed:  ?how much to take ?how to take this ?when to take this ?additional instructions ?  ?divalproex 500 MG 24 hr tablet ?Commonly known as: DEPAKOTE ER ?TAKE 1 TABLET BY MOUTH EVERY DAY WITH 250MG  TABLET FOR TOTAL OF 750MG  ?What changed: Another medication with the same name was changed. Make sure you understand how and when to take each. ?  ?Fish Oil 1000 MG Caps ?Take 2,000 mg by mouth daily. ?  ?ipratropium-albuterol 0.5-2.5 (3) MG/3ML Soln ?Commonly known as: DUONEB ?Take 3 mLs by nebulization every 6 (six) hours as needed. ?  ?multivitamin tablet ?Take 1 tablet by mouth daily. ?  ?saccharomyces boulardii 250 MG capsule ?Commonly known as: FLORASTOR ?Take 1 capsule (250 mg total) by mouth 2 (two) times daily for 5 days. ?  ?vitamin B-12 1000 MCG tablet ?Commonly known as: CYANOCOBALAMIN ?Take 1 tablet (1,000 mcg total) by mouth daily. ?  ? ?  ? ? ?Day of Discharge ?BP 117/80 (BP Location: Right Arm)   Pulse 73   Temp 98.1 ?F (36.7 ?C) (Oral)   Resp 16   SpO2 100%  ? ?Physical Exam: ?General: No acute respiratory distress ?Lungs: Mild bibasilar crackles with no wheezing -coarse upper airway sounds transmitted throughout all fields ?Cardiovascular: Regular rate and rhythm with  3/6 systolic murmur ?Abdomen: Nontender, nondistended, soft, bowel sounds positive, no rebound, no ascites, no appreciable mass ?Extremities: trace bilateral lower extremity edema ? ?Basic Metabolic Panel: ?Recent Labs  ?Lab 11/19/21 ?1129 11/21/21 ?0056 11/22/21 ?6568 11/23/21 ?0119  ?NA 140 138 133* 135  ?K 3.9 3.7 3.7 4.0  ?CL 106 109 101 104  ?CO2 26 22 23 24   ?GLUCOSE 125* 108* 119* 108*  ?BUN 15 16 18 15   ?CREATININE 1.12 1.16 1.14 1.02  ?CALCIUM 9.0 8.3* 8.4* 8.3*  ?MG  --   --  1.9  --   ? ? ? ?Liver Function Tests: ?Recent Labs  ?Lab 11/19/21 ?1129 11/22/21 ?11/21/21  ?AST 20 33  ?ALT 10 13  ?ALKPHOS 79 59  ?BILITOT 1.0 0.6  ?PROT 6.8 5.6*  ?ALBUMIN 3.8 2.6*  ? ? ? ?CBC: ?Recent Labs  ?Lab 11/19/21 ?1129 11/20/21 ?0308  11/21/21 ?0056 11/22/21 ?11/23/21 11/23/21 ?0119  ?WBC 11.7* 10.6* 9.8 8.2 8.2  ?NEUTROABS 10.4*  --   --   --   --   ?HGB 10.3* 10.7* 9.5* 10.3* 9.4*  ?HCT 31.8* 32.0* 28.8* 31.5* 28.8*  ?MCV 96.7 95.0 93.8 95.5 94.4  ?P

## 2021-11-30 DIAGNOSIS — L89152 Pressure ulcer of sacral region, stage 2: Secondary | ICD-10-CM | POA: Diagnosis not present

## 2021-12-07 DIAGNOSIS — L89152 Pressure ulcer of sacral region, stage 2: Secondary | ICD-10-CM | POA: Diagnosis not present

## 2021-12-22 ENCOUNTER — Telehealth: Payer: Self-pay | Admitting: Family Medicine

## 2021-12-22 NOTE — Telephone Encounter (Signed)
Rescheduled 7/6 appointment with pt's daughter over the phone- NP out. ?

## 2022-01-09 DIAGNOSIS — I69891 Dysphagia following other cerebrovascular disease: Secondary | ICD-10-CM | POA: Diagnosis not present

## 2022-01-09 DIAGNOSIS — R2689 Other abnormalities of gait and mobility: Secondary | ICD-10-CM | POA: Diagnosis not present

## 2022-01-09 DIAGNOSIS — J189 Pneumonia, unspecified organism: Secondary | ICD-10-CM | POA: Diagnosis not present

## 2022-01-09 DIAGNOSIS — M6281 Muscle weakness (generalized): Secondary | ICD-10-CM | POA: Diagnosis not present

## 2022-01-09 DIAGNOSIS — I69828 Other speech and language deficits following other cerebrovascular disease: Secondary | ICD-10-CM | POA: Diagnosis not present

## 2022-01-09 DIAGNOSIS — R279 Unspecified lack of coordination: Secondary | ICD-10-CM | POA: Diagnosis not present

## 2022-01-09 DIAGNOSIS — R2681 Unsteadiness on feet: Secondary | ICD-10-CM | POA: Diagnosis not present

## 2022-01-10 DIAGNOSIS — J189 Pneumonia, unspecified organism: Secondary | ICD-10-CM | POA: Diagnosis not present

## 2022-01-10 DIAGNOSIS — R2689 Other abnormalities of gait and mobility: Secondary | ICD-10-CM | POA: Diagnosis not present

## 2022-01-10 DIAGNOSIS — R279 Unspecified lack of coordination: Secondary | ICD-10-CM | POA: Diagnosis not present

## 2022-01-10 DIAGNOSIS — I69891 Dysphagia following other cerebrovascular disease: Secondary | ICD-10-CM | POA: Diagnosis not present

## 2022-01-10 DIAGNOSIS — M6281 Muscle weakness (generalized): Secondary | ICD-10-CM | POA: Diagnosis not present

## 2022-01-10 DIAGNOSIS — I69828 Other speech and language deficits following other cerebrovascular disease: Secondary | ICD-10-CM | POA: Diagnosis not present

## 2022-01-10 DIAGNOSIS — R2681 Unsteadiness on feet: Secondary | ICD-10-CM | POA: Diagnosis not present

## 2022-01-11 DIAGNOSIS — R2681 Unsteadiness on feet: Secondary | ICD-10-CM | POA: Diagnosis not present

## 2022-01-11 DIAGNOSIS — I69828 Other speech and language deficits following other cerebrovascular disease: Secondary | ICD-10-CM | POA: Diagnosis not present

## 2022-01-11 DIAGNOSIS — J189 Pneumonia, unspecified organism: Secondary | ICD-10-CM | POA: Diagnosis not present

## 2022-01-11 DIAGNOSIS — M6281 Muscle weakness (generalized): Secondary | ICD-10-CM | POA: Diagnosis not present

## 2022-01-11 DIAGNOSIS — R2689 Other abnormalities of gait and mobility: Secondary | ICD-10-CM | POA: Diagnosis not present

## 2022-01-11 DIAGNOSIS — I69891 Dysphagia following other cerebrovascular disease: Secondary | ICD-10-CM | POA: Diagnosis not present

## 2022-01-11 DIAGNOSIS — R279 Unspecified lack of coordination: Secondary | ICD-10-CM | POA: Diagnosis not present

## 2022-01-12 DIAGNOSIS — R279 Unspecified lack of coordination: Secondary | ICD-10-CM | POA: Diagnosis not present

## 2022-01-12 DIAGNOSIS — R2681 Unsteadiness on feet: Secondary | ICD-10-CM | POA: Diagnosis not present

## 2022-01-12 DIAGNOSIS — M6281 Muscle weakness (generalized): Secondary | ICD-10-CM | POA: Diagnosis not present

## 2022-01-12 DIAGNOSIS — J189 Pneumonia, unspecified organism: Secondary | ICD-10-CM | POA: Diagnosis not present

## 2022-01-12 DIAGNOSIS — I69828 Other speech and language deficits following other cerebrovascular disease: Secondary | ICD-10-CM | POA: Diagnosis not present

## 2022-01-12 DIAGNOSIS — R2689 Other abnormalities of gait and mobility: Secondary | ICD-10-CM | POA: Diagnosis not present

## 2022-01-12 DIAGNOSIS — I69891 Dysphagia following other cerebrovascular disease: Secondary | ICD-10-CM | POA: Diagnosis not present

## 2022-01-13 DIAGNOSIS — M6281 Muscle weakness (generalized): Secondary | ICD-10-CM | POA: Diagnosis not present

## 2022-01-13 DIAGNOSIS — J189 Pneumonia, unspecified organism: Secondary | ICD-10-CM | POA: Diagnosis not present

## 2022-01-13 DIAGNOSIS — R2689 Other abnormalities of gait and mobility: Secondary | ICD-10-CM | POA: Diagnosis not present

## 2022-01-13 DIAGNOSIS — R2681 Unsteadiness on feet: Secondary | ICD-10-CM | POA: Diagnosis not present

## 2022-01-13 DIAGNOSIS — I69891 Dysphagia following other cerebrovascular disease: Secondary | ICD-10-CM | POA: Diagnosis not present

## 2022-01-13 DIAGNOSIS — I69828 Other speech and language deficits following other cerebrovascular disease: Secondary | ICD-10-CM | POA: Diagnosis not present

## 2022-01-13 DIAGNOSIS — R279 Unspecified lack of coordination: Secondary | ICD-10-CM | POA: Diagnosis not present

## 2022-01-14 DIAGNOSIS — R279 Unspecified lack of coordination: Secondary | ICD-10-CM | POA: Diagnosis not present

## 2022-01-14 DIAGNOSIS — I69891 Dysphagia following other cerebrovascular disease: Secondary | ICD-10-CM | POA: Diagnosis not present

## 2022-01-14 DIAGNOSIS — R2681 Unsteadiness on feet: Secondary | ICD-10-CM | POA: Diagnosis not present

## 2022-01-14 DIAGNOSIS — R2689 Other abnormalities of gait and mobility: Secondary | ICD-10-CM | POA: Diagnosis not present

## 2022-01-14 DIAGNOSIS — J189 Pneumonia, unspecified organism: Secondary | ICD-10-CM | POA: Diagnosis not present

## 2022-01-14 DIAGNOSIS — I69828 Other speech and language deficits following other cerebrovascular disease: Secondary | ICD-10-CM | POA: Diagnosis not present

## 2022-01-14 DIAGNOSIS — M6281 Muscle weakness (generalized): Secondary | ICD-10-CM | POA: Diagnosis not present

## 2022-01-15 DIAGNOSIS — I69891 Dysphagia following other cerebrovascular disease: Secondary | ICD-10-CM | POA: Diagnosis not present

## 2022-01-15 DIAGNOSIS — M6281 Muscle weakness (generalized): Secondary | ICD-10-CM | POA: Diagnosis not present

## 2022-01-15 DIAGNOSIS — R2681 Unsteadiness on feet: Secondary | ICD-10-CM | POA: Diagnosis not present

## 2022-01-15 DIAGNOSIS — R2689 Other abnormalities of gait and mobility: Secondary | ICD-10-CM | POA: Diagnosis not present

## 2022-01-15 DIAGNOSIS — J189 Pneumonia, unspecified organism: Secondary | ICD-10-CM | POA: Diagnosis not present

## 2022-01-15 DIAGNOSIS — I69828 Other speech and language deficits following other cerebrovascular disease: Secondary | ICD-10-CM | POA: Diagnosis not present

## 2022-01-15 DIAGNOSIS — R279 Unspecified lack of coordination: Secondary | ICD-10-CM | POA: Diagnosis not present

## 2022-01-16 DIAGNOSIS — R2681 Unsteadiness on feet: Secondary | ICD-10-CM | POA: Diagnosis not present

## 2022-01-16 DIAGNOSIS — J189 Pneumonia, unspecified organism: Secondary | ICD-10-CM | POA: Diagnosis not present

## 2022-01-16 DIAGNOSIS — R2689 Other abnormalities of gait and mobility: Secondary | ICD-10-CM | POA: Diagnosis not present

## 2022-01-16 DIAGNOSIS — R279 Unspecified lack of coordination: Secondary | ICD-10-CM | POA: Diagnosis not present

## 2022-01-16 DIAGNOSIS — I69891 Dysphagia following other cerebrovascular disease: Secondary | ICD-10-CM | POA: Diagnosis not present

## 2022-01-16 DIAGNOSIS — I69828 Other speech and language deficits following other cerebrovascular disease: Secondary | ICD-10-CM | POA: Diagnosis not present

## 2022-01-16 DIAGNOSIS — M6281 Muscle weakness (generalized): Secondary | ICD-10-CM | POA: Diagnosis not present

## 2022-01-17 DIAGNOSIS — J189 Pneumonia, unspecified organism: Secondary | ICD-10-CM | POA: Diagnosis not present

## 2022-01-17 DIAGNOSIS — I69828 Other speech and language deficits following other cerebrovascular disease: Secondary | ICD-10-CM | POA: Diagnosis not present

## 2022-01-17 DIAGNOSIS — R279 Unspecified lack of coordination: Secondary | ICD-10-CM | POA: Diagnosis not present

## 2022-01-17 DIAGNOSIS — R2689 Other abnormalities of gait and mobility: Secondary | ICD-10-CM | POA: Diagnosis not present

## 2022-01-17 DIAGNOSIS — I69891 Dysphagia following other cerebrovascular disease: Secondary | ICD-10-CM | POA: Diagnosis not present

## 2022-01-17 DIAGNOSIS — R2681 Unsteadiness on feet: Secondary | ICD-10-CM | POA: Diagnosis not present

## 2022-01-17 DIAGNOSIS — M6281 Muscle weakness (generalized): Secondary | ICD-10-CM | POA: Diagnosis not present

## 2022-01-18 DIAGNOSIS — R279 Unspecified lack of coordination: Secondary | ICD-10-CM | POA: Diagnosis not present

## 2022-01-18 DIAGNOSIS — R2689 Other abnormalities of gait and mobility: Secondary | ICD-10-CM | POA: Diagnosis not present

## 2022-01-18 DIAGNOSIS — I69891 Dysphagia following other cerebrovascular disease: Secondary | ICD-10-CM | POA: Diagnosis not present

## 2022-01-18 DIAGNOSIS — I69828 Other speech and language deficits following other cerebrovascular disease: Secondary | ICD-10-CM | POA: Diagnosis not present

## 2022-01-18 DIAGNOSIS — M6281 Muscle weakness (generalized): Secondary | ICD-10-CM | POA: Diagnosis not present

## 2022-01-18 DIAGNOSIS — R2681 Unsteadiness on feet: Secondary | ICD-10-CM | POA: Diagnosis not present

## 2022-01-18 DIAGNOSIS — J189 Pneumonia, unspecified organism: Secondary | ICD-10-CM | POA: Diagnosis not present

## 2022-01-19 DIAGNOSIS — I69828 Other speech and language deficits following other cerebrovascular disease: Secondary | ICD-10-CM | POA: Diagnosis not present

## 2022-01-19 DIAGNOSIS — M6281 Muscle weakness (generalized): Secondary | ICD-10-CM | POA: Diagnosis not present

## 2022-01-19 DIAGNOSIS — R2681 Unsteadiness on feet: Secondary | ICD-10-CM | POA: Diagnosis not present

## 2022-01-19 DIAGNOSIS — I69891 Dysphagia following other cerebrovascular disease: Secondary | ICD-10-CM | POA: Diagnosis not present

## 2022-01-19 DIAGNOSIS — R2689 Other abnormalities of gait and mobility: Secondary | ICD-10-CM | POA: Diagnosis not present

## 2022-01-19 DIAGNOSIS — R279 Unspecified lack of coordination: Secondary | ICD-10-CM | POA: Diagnosis not present

## 2022-01-19 DIAGNOSIS — J189 Pneumonia, unspecified organism: Secondary | ICD-10-CM | POA: Diagnosis not present

## 2022-01-21 DIAGNOSIS — R2689 Other abnormalities of gait and mobility: Secondary | ICD-10-CM | POA: Diagnosis not present

## 2022-01-21 DIAGNOSIS — I69828 Other speech and language deficits following other cerebrovascular disease: Secondary | ICD-10-CM | POA: Diagnosis not present

## 2022-01-21 DIAGNOSIS — R279 Unspecified lack of coordination: Secondary | ICD-10-CM | POA: Diagnosis not present

## 2022-01-21 DIAGNOSIS — I69891 Dysphagia following other cerebrovascular disease: Secondary | ICD-10-CM | POA: Diagnosis not present

## 2022-01-21 DIAGNOSIS — R2681 Unsteadiness on feet: Secondary | ICD-10-CM | POA: Diagnosis not present

## 2022-01-21 DIAGNOSIS — J189 Pneumonia, unspecified organism: Secondary | ICD-10-CM | POA: Diagnosis not present

## 2022-01-21 DIAGNOSIS — M6281 Muscle weakness (generalized): Secondary | ICD-10-CM | POA: Diagnosis not present

## 2022-01-22 DIAGNOSIS — R279 Unspecified lack of coordination: Secondary | ICD-10-CM | POA: Diagnosis not present

## 2022-01-22 DIAGNOSIS — J189 Pneumonia, unspecified organism: Secondary | ICD-10-CM | POA: Diagnosis not present

## 2022-01-22 DIAGNOSIS — I69891 Dysphagia following other cerebrovascular disease: Secondary | ICD-10-CM | POA: Diagnosis not present

## 2022-01-22 DIAGNOSIS — I69828 Other speech and language deficits following other cerebrovascular disease: Secondary | ICD-10-CM | POA: Diagnosis not present

## 2022-01-22 DIAGNOSIS — R2689 Other abnormalities of gait and mobility: Secondary | ICD-10-CM | POA: Diagnosis not present

## 2022-01-22 DIAGNOSIS — M6281 Muscle weakness (generalized): Secondary | ICD-10-CM | POA: Diagnosis not present

## 2022-01-22 DIAGNOSIS — R2681 Unsteadiness on feet: Secondary | ICD-10-CM | POA: Diagnosis not present

## 2022-01-25 DIAGNOSIS — J189 Pneumonia, unspecified organism: Secondary | ICD-10-CM | POA: Diagnosis not present

## 2022-01-25 DIAGNOSIS — M6281 Muscle weakness (generalized): Secondary | ICD-10-CM | POA: Diagnosis not present

## 2022-01-25 DIAGNOSIS — R279 Unspecified lack of coordination: Secondary | ICD-10-CM | POA: Diagnosis not present

## 2022-01-25 DIAGNOSIS — R2689 Other abnormalities of gait and mobility: Secondary | ICD-10-CM | POA: Diagnosis not present

## 2022-01-25 DIAGNOSIS — R2681 Unsteadiness on feet: Secondary | ICD-10-CM | POA: Diagnosis not present

## 2022-01-25 DIAGNOSIS — I69891 Dysphagia following other cerebrovascular disease: Secondary | ICD-10-CM | POA: Diagnosis not present

## 2022-01-25 DIAGNOSIS — I69828 Other speech and language deficits following other cerebrovascular disease: Secondary | ICD-10-CM | POA: Diagnosis not present

## 2022-01-26 DIAGNOSIS — I69828 Other speech and language deficits following other cerebrovascular disease: Secondary | ICD-10-CM | POA: Diagnosis not present

## 2022-01-26 DIAGNOSIS — I69891 Dysphagia following other cerebrovascular disease: Secondary | ICD-10-CM | POA: Diagnosis not present

## 2022-01-26 DIAGNOSIS — R279 Unspecified lack of coordination: Secondary | ICD-10-CM | POA: Diagnosis not present

## 2022-01-26 DIAGNOSIS — M6281 Muscle weakness (generalized): Secondary | ICD-10-CM | POA: Diagnosis not present

## 2022-01-26 DIAGNOSIS — R2689 Other abnormalities of gait and mobility: Secondary | ICD-10-CM | POA: Diagnosis not present

## 2022-01-26 DIAGNOSIS — J189 Pneumonia, unspecified organism: Secondary | ICD-10-CM | POA: Diagnosis not present

## 2022-01-26 DIAGNOSIS — R2681 Unsteadiness on feet: Secondary | ICD-10-CM | POA: Diagnosis not present

## 2022-01-27 DIAGNOSIS — I69828 Other speech and language deficits following other cerebrovascular disease: Secondary | ICD-10-CM | POA: Diagnosis not present

## 2022-01-27 DIAGNOSIS — R2689 Other abnormalities of gait and mobility: Secondary | ICD-10-CM | POA: Diagnosis not present

## 2022-01-27 DIAGNOSIS — R279 Unspecified lack of coordination: Secondary | ICD-10-CM | POA: Diagnosis not present

## 2022-01-27 DIAGNOSIS — I69891 Dysphagia following other cerebrovascular disease: Secondary | ICD-10-CM | POA: Diagnosis not present

## 2022-01-27 DIAGNOSIS — M6281 Muscle weakness (generalized): Secondary | ICD-10-CM | POA: Diagnosis not present

## 2022-01-27 DIAGNOSIS — R2681 Unsteadiness on feet: Secondary | ICD-10-CM | POA: Diagnosis not present

## 2022-01-27 DIAGNOSIS — J189 Pneumonia, unspecified organism: Secondary | ICD-10-CM | POA: Diagnosis not present

## 2022-01-28 DIAGNOSIS — R2681 Unsteadiness on feet: Secondary | ICD-10-CM | POA: Diagnosis not present

## 2022-01-28 DIAGNOSIS — M6281 Muscle weakness (generalized): Secondary | ICD-10-CM | POA: Diagnosis not present

## 2022-01-28 DIAGNOSIS — I69891 Dysphagia following other cerebrovascular disease: Secondary | ICD-10-CM | POA: Diagnosis not present

## 2022-01-28 DIAGNOSIS — J189 Pneumonia, unspecified organism: Secondary | ICD-10-CM | POA: Diagnosis not present

## 2022-01-28 DIAGNOSIS — R279 Unspecified lack of coordination: Secondary | ICD-10-CM | POA: Diagnosis not present

## 2022-01-28 DIAGNOSIS — R2689 Other abnormalities of gait and mobility: Secondary | ICD-10-CM | POA: Diagnosis not present

## 2022-01-28 DIAGNOSIS — I69828 Other speech and language deficits following other cerebrovascular disease: Secondary | ICD-10-CM | POA: Diagnosis not present

## 2022-01-29 DIAGNOSIS — M6281 Muscle weakness (generalized): Secondary | ICD-10-CM | POA: Diagnosis not present

## 2022-01-29 DIAGNOSIS — R2689 Other abnormalities of gait and mobility: Secondary | ICD-10-CM | POA: Diagnosis not present

## 2022-01-29 DIAGNOSIS — R279 Unspecified lack of coordination: Secondary | ICD-10-CM | POA: Diagnosis not present

## 2022-01-29 DIAGNOSIS — J189 Pneumonia, unspecified organism: Secondary | ICD-10-CM | POA: Diagnosis not present

## 2022-01-29 DIAGNOSIS — I69828 Other speech and language deficits following other cerebrovascular disease: Secondary | ICD-10-CM | POA: Diagnosis not present

## 2022-01-29 DIAGNOSIS — I69891 Dysphagia following other cerebrovascular disease: Secondary | ICD-10-CM | POA: Diagnosis not present

## 2022-01-29 DIAGNOSIS — R2681 Unsteadiness on feet: Secondary | ICD-10-CM | POA: Diagnosis not present

## 2022-01-30 DIAGNOSIS — I69891 Dysphagia following other cerebrovascular disease: Secondary | ICD-10-CM | POA: Diagnosis not present

## 2022-01-30 DIAGNOSIS — J189 Pneumonia, unspecified organism: Secondary | ICD-10-CM | POA: Diagnosis not present

## 2022-01-30 DIAGNOSIS — M6281 Muscle weakness (generalized): Secondary | ICD-10-CM | POA: Diagnosis not present

## 2022-01-30 DIAGNOSIS — R2681 Unsteadiness on feet: Secondary | ICD-10-CM | POA: Diagnosis not present

## 2022-01-30 DIAGNOSIS — R2689 Other abnormalities of gait and mobility: Secondary | ICD-10-CM | POA: Diagnosis not present

## 2022-01-30 DIAGNOSIS — I69828 Other speech and language deficits following other cerebrovascular disease: Secondary | ICD-10-CM | POA: Diagnosis not present

## 2022-01-30 DIAGNOSIS — R279 Unspecified lack of coordination: Secondary | ICD-10-CM | POA: Diagnosis not present

## 2022-02-01 DIAGNOSIS — I69891 Dysphagia following other cerebrovascular disease: Secondary | ICD-10-CM | POA: Diagnosis not present

## 2022-02-01 DIAGNOSIS — R2689 Other abnormalities of gait and mobility: Secondary | ICD-10-CM | POA: Diagnosis not present

## 2022-02-01 DIAGNOSIS — J189 Pneumonia, unspecified organism: Secondary | ICD-10-CM | POA: Diagnosis not present

## 2022-02-01 DIAGNOSIS — R2681 Unsteadiness on feet: Secondary | ICD-10-CM | POA: Diagnosis not present

## 2022-02-01 DIAGNOSIS — M6281 Muscle weakness (generalized): Secondary | ICD-10-CM | POA: Diagnosis not present

## 2022-02-01 DIAGNOSIS — I69828 Other speech and language deficits following other cerebrovascular disease: Secondary | ICD-10-CM | POA: Diagnosis not present

## 2022-02-01 DIAGNOSIS — R279 Unspecified lack of coordination: Secondary | ICD-10-CM | POA: Diagnosis not present

## 2022-02-02 DIAGNOSIS — I69828 Other speech and language deficits following other cerebrovascular disease: Secondary | ICD-10-CM | POA: Diagnosis not present

## 2022-02-02 DIAGNOSIS — J189 Pneumonia, unspecified organism: Secondary | ICD-10-CM | POA: Diagnosis not present

## 2022-02-02 DIAGNOSIS — R279 Unspecified lack of coordination: Secondary | ICD-10-CM | POA: Diagnosis not present

## 2022-02-02 DIAGNOSIS — R2681 Unsteadiness on feet: Secondary | ICD-10-CM | POA: Diagnosis not present

## 2022-02-02 DIAGNOSIS — M6281 Muscle weakness (generalized): Secondary | ICD-10-CM | POA: Diagnosis not present

## 2022-02-02 DIAGNOSIS — I69891 Dysphagia following other cerebrovascular disease: Secondary | ICD-10-CM | POA: Diagnosis not present

## 2022-02-02 DIAGNOSIS — R2689 Other abnormalities of gait and mobility: Secondary | ICD-10-CM | POA: Diagnosis not present

## 2022-02-03 DIAGNOSIS — J189 Pneumonia, unspecified organism: Secondary | ICD-10-CM | POA: Diagnosis not present

## 2022-02-03 DIAGNOSIS — M6281 Muscle weakness (generalized): Secondary | ICD-10-CM | POA: Diagnosis not present

## 2022-02-03 DIAGNOSIS — R2689 Other abnormalities of gait and mobility: Secondary | ICD-10-CM | POA: Diagnosis not present

## 2022-02-03 DIAGNOSIS — I69828 Other speech and language deficits following other cerebrovascular disease: Secondary | ICD-10-CM | POA: Diagnosis not present

## 2022-02-03 DIAGNOSIS — R2681 Unsteadiness on feet: Secondary | ICD-10-CM | POA: Diagnosis not present

## 2022-02-03 DIAGNOSIS — R279 Unspecified lack of coordination: Secondary | ICD-10-CM | POA: Diagnosis not present

## 2022-02-03 DIAGNOSIS — I69891 Dysphagia following other cerebrovascular disease: Secondary | ICD-10-CM | POA: Diagnosis not present

## 2022-02-04 DIAGNOSIS — R2689 Other abnormalities of gait and mobility: Secondary | ICD-10-CM | POA: Diagnosis not present

## 2022-02-04 DIAGNOSIS — J189 Pneumonia, unspecified organism: Secondary | ICD-10-CM | POA: Diagnosis not present

## 2022-02-04 DIAGNOSIS — M6281 Muscle weakness (generalized): Secondary | ICD-10-CM | POA: Diagnosis not present

## 2022-02-04 DIAGNOSIS — I69828 Other speech and language deficits following other cerebrovascular disease: Secondary | ICD-10-CM | POA: Diagnosis not present

## 2022-02-04 DIAGNOSIS — R279 Unspecified lack of coordination: Secondary | ICD-10-CM | POA: Diagnosis not present

## 2022-02-04 DIAGNOSIS — I69891 Dysphagia following other cerebrovascular disease: Secondary | ICD-10-CM | POA: Diagnosis not present

## 2022-02-04 DIAGNOSIS — R2681 Unsteadiness on feet: Secondary | ICD-10-CM | POA: Diagnosis not present

## 2022-02-06 DIAGNOSIS — J189 Pneumonia, unspecified organism: Secondary | ICD-10-CM | POA: Diagnosis not present

## 2022-02-11 ENCOUNTER — Ambulatory Visit: Payer: Medicare Other | Admitting: Family Medicine

## 2022-02-11 DIAGNOSIS — R2681 Unsteadiness on feet: Secondary | ICD-10-CM | POA: Diagnosis not present

## 2022-02-11 DIAGNOSIS — M1990 Unspecified osteoarthritis, unspecified site: Secondary | ICD-10-CM | POA: Diagnosis not present

## 2022-02-11 DIAGNOSIS — J189 Pneumonia, unspecified organism: Secondary | ICD-10-CM | POA: Diagnosis not present

## 2022-02-11 DIAGNOSIS — R2689 Other abnormalities of gait and mobility: Secondary | ICD-10-CM | POA: Diagnosis not present

## 2022-02-11 DIAGNOSIS — Z9181 History of falling: Secondary | ICD-10-CM | POA: Diagnosis not present

## 2022-02-12 DIAGNOSIS — M1990 Unspecified osteoarthritis, unspecified site: Secondary | ICD-10-CM | POA: Diagnosis not present

## 2022-02-12 DIAGNOSIS — R2681 Unsteadiness on feet: Secondary | ICD-10-CM | POA: Diagnosis not present

## 2022-02-12 DIAGNOSIS — Z9181 History of falling: Secondary | ICD-10-CM | POA: Diagnosis not present

## 2022-02-12 DIAGNOSIS — J189 Pneumonia, unspecified organism: Secondary | ICD-10-CM | POA: Diagnosis not present

## 2022-02-12 DIAGNOSIS — R2689 Other abnormalities of gait and mobility: Secondary | ICD-10-CM | POA: Diagnosis not present

## 2022-02-13 DIAGNOSIS — M1990 Unspecified osteoarthritis, unspecified site: Secondary | ICD-10-CM | POA: Diagnosis not present

## 2022-02-13 DIAGNOSIS — Z9181 History of falling: Secondary | ICD-10-CM | POA: Diagnosis not present

## 2022-02-13 DIAGNOSIS — J189 Pneumonia, unspecified organism: Secondary | ICD-10-CM | POA: Diagnosis not present

## 2022-02-13 DIAGNOSIS — R2681 Unsteadiness on feet: Secondary | ICD-10-CM | POA: Diagnosis not present

## 2022-02-13 DIAGNOSIS — R2689 Other abnormalities of gait and mobility: Secondary | ICD-10-CM | POA: Diagnosis not present

## 2022-02-14 DIAGNOSIS — R2689 Other abnormalities of gait and mobility: Secondary | ICD-10-CM | POA: Diagnosis not present

## 2022-02-14 DIAGNOSIS — Z9181 History of falling: Secondary | ICD-10-CM | POA: Diagnosis not present

## 2022-02-14 DIAGNOSIS — J189 Pneumonia, unspecified organism: Secondary | ICD-10-CM | POA: Diagnosis not present

## 2022-02-14 DIAGNOSIS — M1990 Unspecified osteoarthritis, unspecified site: Secondary | ICD-10-CM | POA: Diagnosis not present

## 2022-02-14 DIAGNOSIS — R2681 Unsteadiness on feet: Secondary | ICD-10-CM | POA: Diagnosis not present

## 2022-02-15 DIAGNOSIS — R2689 Other abnormalities of gait and mobility: Secondary | ICD-10-CM | POA: Diagnosis not present

## 2022-02-15 DIAGNOSIS — Z9181 History of falling: Secondary | ICD-10-CM | POA: Diagnosis not present

## 2022-02-15 DIAGNOSIS — R2681 Unsteadiness on feet: Secondary | ICD-10-CM | POA: Diagnosis not present

## 2022-02-15 DIAGNOSIS — M1990 Unspecified osteoarthritis, unspecified site: Secondary | ICD-10-CM | POA: Diagnosis not present

## 2022-02-15 DIAGNOSIS — J189 Pneumonia, unspecified organism: Secondary | ICD-10-CM | POA: Diagnosis not present

## 2022-02-16 ENCOUNTER — Ambulatory Visit: Payer: Medicare Other | Admitting: Family Medicine

## 2022-02-16 DIAGNOSIS — R2689 Other abnormalities of gait and mobility: Secondary | ICD-10-CM | POA: Diagnosis not present

## 2022-02-16 DIAGNOSIS — R2681 Unsteadiness on feet: Secondary | ICD-10-CM | POA: Diagnosis not present

## 2022-02-16 DIAGNOSIS — Z9181 History of falling: Secondary | ICD-10-CM | POA: Diagnosis not present

## 2022-02-16 DIAGNOSIS — M1990 Unspecified osteoarthritis, unspecified site: Secondary | ICD-10-CM | POA: Diagnosis not present

## 2022-02-16 DIAGNOSIS — J189 Pneumonia, unspecified organism: Secondary | ICD-10-CM | POA: Diagnosis not present

## 2022-02-17 DIAGNOSIS — Z9181 History of falling: Secondary | ICD-10-CM | POA: Diagnosis not present

## 2022-02-17 DIAGNOSIS — R2689 Other abnormalities of gait and mobility: Secondary | ICD-10-CM | POA: Diagnosis not present

## 2022-02-17 DIAGNOSIS — J189 Pneumonia, unspecified organism: Secondary | ICD-10-CM | POA: Diagnosis not present

## 2022-02-17 DIAGNOSIS — M1990 Unspecified osteoarthritis, unspecified site: Secondary | ICD-10-CM | POA: Diagnosis not present

## 2022-02-17 DIAGNOSIS — R2681 Unsteadiness on feet: Secondary | ICD-10-CM | POA: Diagnosis not present

## 2022-03-13 DIAGNOSIS — R2689 Other abnormalities of gait and mobility: Secondary | ICD-10-CM | POA: Diagnosis not present

## 2022-03-13 DIAGNOSIS — R2681 Unsteadiness on feet: Secondary | ICD-10-CM | POA: Diagnosis not present

## 2022-03-13 DIAGNOSIS — R1312 Dysphagia, oropharyngeal phase: Secondary | ICD-10-CM | POA: Diagnosis not present

## 2022-03-13 DIAGNOSIS — U071 COVID-19: Secondary | ICD-10-CM | POA: Diagnosis not present

## 2022-03-13 DIAGNOSIS — R262 Difficulty in walking, not elsewhere classified: Secondary | ICD-10-CM | POA: Diagnosis not present

## 2022-03-13 DIAGNOSIS — R279 Unspecified lack of coordination: Secondary | ICD-10-CM | POA: Diagnosis not present

## 2022-03-13 DIAGNOSIS — M6281 Muscle weakness (generalized): Secondary | ICD-10-CM | POA: Diagnosis not present

## 2022-03-15 DIAGNOSIS — M6281 Muscle weakness (generalized): Secondary | ICD-10-CM | POA: Diagnosis not present

## 2022-03-15 DIAGNOSIS — R2681 Unsteadiness on feet: Secondary | ICD-10-CM | POA: Diagnosis not present

## 2022-03-15 DIAGNOSIS — R2689 Other abnormalities of gait and mobility: Secondary | ICD-10-CM | POA: Diagnosis not present

## 2022-03-15 DIAGNOSIS — R262 Difficulty in walking, not elsewhere classified: Secondary | ICD-10-CM | POA: Diagnosis not present

## 2022-03-15 DIAGNOSIS — R279 Unspecified lack of coordination: Secondary | ICD-10-CM | POA: Diagnosis not present

## 2022-03-15 DIAGNOSIS — R1312 Dysphagia, oropharyngeal phase: Secondary | ICD-10-CM | POA: Diagnosis not present

## 2022-03-15 DIAGNOSIS — U071 COVID-19: Secondary | ICD-10-CM | POA: Diagnosis not present

## 2022-03-17 DIAGNOSIS — R1312 Dysphagia, oropharyngeal phase: Secondary | ICD-10-CM | POA: Diagnosis not present

## 2022-03-17 DIAGNOSIS — R262 Difficulty in walking, not elsewhere classified: Secondary | ICD-10-CM | POA: Diagnosis not present

## 2022-03-17 DIAGNOSIS — R2681 Unsteadiness on feet: Secondary | ICD-10-CM | POA: Diagnosis not present

## 2022-03-17 DIAGNOSIS — M6281 Muscle weakness (generalized): Secondary | ICD-10-CM | POA: Diagnosis not present

## 2022-03-17 DIAGNOSIS — R279 Unspecified lack of coordination: Secondary | ICD-10-CM | POA: Diagnosis not present

## 2022-03-17 DIAGNOSIS — R2689 Other abnormalities of gait and mobility: Secondary | ICD-10-CM | POA: Diagnosis not present

## 2022-03-17 DIAGNOSIS — U071 COVID-19: Secondary | ICD-10-CM | POA: Diagnosis not present

## 2022-03-21 DIAGNOSIS — U071 COVID-19: Secondary | ICD-10-CM | POA: Diagnosis not present

## 2022-03-21 DIAGNOSIS — R2689 Other abnormalities of gait and mobility: Secondary | ICD-10-CM | POA: Diagnosis not present

## 2022-03-21 DIAGNOSIS — R262 Difficulty in walking, not elsewhere classified: Secondary | ICD-10-CM | POA: Diagnosis not present

## 2022-03-21 DIAGNOSIS — M6281 Muscle weakness (generalized): Secondary | ICD-10-CM | POA: Diagnosis not present

## 2022-03-21 DIAGNOSIS — R1312 Dysphagia, oropharyngeal phase: Secondary | ICD-10-CM | POA: Diagnosis not present

## 2022-03-21 DIAGNOSIS — R279 Unspecified lack of coordination: Secondary | ICD-10-CM | POA: Diagnosis not present

## 2022-03-21 DIAGNOSIS — R2681 Unsteadiness on feet: Secondary | ICD-10-CM | POA: Diagnosis not present

## 2022-03-22 DIAGNOSIS — M6281 Muscle weakness (generalized): Secondary | ICD-10-CM | POA: Diagnosis not present

## 2022-03-22 DIAGNOSIS — R2681 Unsteadiness on feet: Secondary | ICD-10-CM | POA: Diagnosis not present

## 2022-03-22 DIAGNOSIS — R262 Difficulty in walking, not elsewhere classified: Secondary | ICD-10-CM | POA: Diagnosis not present

## 2022-03-22 DIAGNOSIS — R1312 Dysphagia, oropharyngeal phase: Secondary | ICD-10-CM | POA: Diagnosis not present

## 2022-03-22 DIAGNOSIS — R2689 Other abnormalities of gait and mobility: Secondary | ICD-10-CM | POA: Diagnosis not present

## 2022-03-22 DIAGNOSIS — R279 Unspecified lack of coordination: Secondary | ICD-10-CM | POA: Diagnosis not present

## 2022-03-22 DIAGNOSIS — U071 COVID-19: Secondary | ICD-10-CM | POA: Diagnosis not present

## 2022-03-24 DIAGNOSIS — R2689 Other abnormalities of gait and mobility: Secondary | ICD-10-CM | POA: Diagnosis not present

## 2022-03-24 DIAGNOSIS — R279 Unspecified lack of coordination: Secondary | ICD-10-CM | POA: Diagnosis not present

## 2022-03-24 DIAGNOSIS — R1312 Dysphagia, oropharyngeal phase: Secondary | ICD-10-CM | POA: Diagnosis not present

## 2022-03-24 DIAGNOSIS — M6281 Muscle weakness (generalized): Secondary | ICD-10-CM | POA: Diagnosis not present

## 2022-03-24 DIAGNOSIS — R2681 Unsteadiness on feet: Secondary | ICD-10-CM | POA: Diagnosis not present

## 2022-03-24 DIAGNOSIS — U071 COVID-19: Secondary | ICD-10-CM | POA: Diagnosis not present

## 2022-03-24 DIAGNOSIS — R262 Difficulty in walking, not elsewhere classified: Secondary | ICD-10-CM | POA: Diagnosis not present

## 2022-04-08 DIAGNOSIS — R2689 Other abnormalities of gait and mobility: Secondary | ICD-10-CM | POA: Diagnosis not present

## 2022-04-08 DIAGNOSIS — R1312 Dysphagia, oropharyngeal phase: Secondary | ICD-10-CM | POA: Diagnosis not present

## 2022-04-08 DIAGNOSIS — M6281 Muscle weakness (generalized): Secondary | ICD-10-CM | POA: Diagnosis not present

## 2022-04-08 DIAGNOSIS — R279 Unspecified lack of coordination: Secondary | ICD-10-CM | POA: Diagnosis not present

## 2022-04-08 DIAGNOSIS — R262 Difficulty in walking, not elsewhere classified: Secondary | ICD-10-CM | POA: Diagnosis not present

## 2022-04-08 DIAGNOSIS — U071 COVID-19: Secondary | ICD-10-CM | POA: Diagnosis not present

## 2022-04-08 DIAGNOSIS — R2681 Unsteadiness on feet: Secondary | ICD-10-CM | POA: Diagnosis not present

## 2022-04-09 DIAGNOSIS — M1990 Unspecified osteoarthritis, unspecified site: Secondary | ICD-10-CM | POA: Diagnosis not present

## 2022-04-09 DIAGNOSIS — R262 Difficulty in walking, not elsewhere classified: Secondary | ICD-10-CM | POA: Diagnosis not present

## 2022-04-09 DIAGNOSIS — R2689 Other abnormalities of gait and mobility: Secondary | ICD-10-CM | POA: Diagnosis not present

## 2022-04-09 DIAGNOSIS — I4891 Unspecified atrial fibrillation: Secondary | ICD-10-CM | POA: Diagnosis not present

## 2022-04-09 DIAGNOSIS — M6281 Muscle weakness (generalized): Secondary | ICD-10-CM | POA: Diagnosis not present

## 2022-04-09 DIAGNOSIS — R279 Unspecified lack of coordination: Secondary | ICD-10-CM | POA: Diagnosis not present

## 2022-04-09 DIAGNOSIS — R2681 Unsteadiness on feet: Secondary | ICD-10-CM | POA: Diagnosis not present

## 2022-04-09 DIAGNOSIS — U071 COVID-19: Secondary | ICD-10-CM | POA: Diagnosis not present

## 2022-04-12 DIAGNOSIS — M6281 Muscle weakness (generalized): Secondary | ICD-10-CM | POA: Diagnosis not present

## 2022-04-12 DIAGNOSIS — R2681 Unsteadiness on feet: Secondary | ICD-10-CM | POA: Diagnosis not present

## 2022-04-12 DIAGNOSIS — I4891 Unspecified atrial fibrillation: Secondary | ICD-10-CM | POA: Diagnosis not present

## 2022-04-12 DIAGNOSIS — M1990 Unspecified osteoarthritis, unspecified site: Secondary | ICD-10-CM | POA: Diagnosis not present

## 2022-04-12 DIAGNOSIS — R262 Difficulty in walking, not elsewhere classified: Secondary | ICD-10-CM | POA: Diagnosis not present

## 2022-04-12 DIAGNOSIS — U071 COVID-19: Secondary | ICD-10-CM | POA: Diagnosis not present

## 2022-04-12 DIAGNOSIS — R279 Unspecified lack of coordination: Secondary | ICD-10-CM | POA: Diagnosis not present

## 2022-04-12 DIAGNOSIS — R2689 Other abnormalities of gait and mobility: Secondary | ICD-10-CM | POA: Diagnosis not present

## 2022-04-13 DIAGNOSIS — M6281 Muscle weakness (generalized): Secondary | ICD-10-CM | POA: Diagnosis not present

## 2022-04-13 DIAGNOSIS — U071 COVID-19: Secondary | ICD-10-CM | POA: Diagnosis not present

## 2022-04-13 DIAGNOSIS — R262 Difficulty in walking, not elsewhere classified: Secondary | ICD-10-CM | POA: Diagnosis not present

## 2022-04-13 DIAGNOSIS — M1990 Unspecified osteoarthritis, unspecified site: Secondary | ICD-10-CM | POA: Diagnosis not present

## 2022-04-13 DIAGNOSIS — I4891 Unspecified atrial fibrillation: Secondary | ICD-10-CM | POA: Diagnosis not present

## 2022-04-13 DIAGNOSIS — R279 Unspecified lack of coordination: Secondary | ICD-10-CM | POA: Diagnosis not present

## 2022-04-13 DIAGNOSIS — R2681 Unsteadiness on feet: Secondary | ICD-10-CM | POA: Diagnosis not present

## 2022-04-13 DIAGNOSIS — R2689 Other abnormalities of gait and mobility: Secondary | ICD-10-CM | POA: Diagnosis not present

## 2022-04-14 DIAGNOSIS — M6281 Muscle weakness (generalized): Secondary | ICD-10-CM | POA: Diagnosis not present

## 2022-04-14 DIAGNOSIS — U071 COVID-19: Secondary | ICD-10-CM | POA: Diagnosis not present

## 2022-04-14 DIAGNOSIS — R262 Difficulty in walking, not elsewhere classified: Secondary | ICD-10-CM | POA: Diagnosis not present

## 2022-04-14 DIAGNOSIS — M1990 Unspecified osteoarthritis, unspecified site: Secondary | ICD-10-CM | POA: Diagnosis not present

## 2022-04-14 DIAGNOSIS — R2681 Unsteadiness on feet: Secondary | ICD-10-CM | POA: Diagnosis not present

## 2022-04-14 DIAGNOSIS — I4891 Unspecified atrial fibrillation: Secondary | ICD-10-CM | POA: Diagnosis not present

## 2022-04-14 DIAGNOSIS — R2689 Other abnormalities of gait and mobility: Secondary | ICD-10-CM | POA: Diagnosis not present

## 2022-04-14 DIAGNOSIS — R279 Unspecified lack of coordination: Secondary | ICD-10-CM | POA: Diagnosis not present

## 2022-04-15 DIAGNOSIS — R279 Unspecified lack of coordination: Secondary | ICD-10-CM | POA: Diagnosis not present

## 2022-04-15 DIAGNOSIS — M1990 Unspecified osteoarthritis, unspecified site: Secondary | ICD-10-CM | POA: Diagnosis not present

## 2022-04-15 DIAGNOSIS — I4891 Unspecified atrial fibrillation: Secondary | ICD-10-CM | POA: Diagnosis not present

## 2022-04-15 DIAGNOSIS — M6281 Muscle weakness (generalized): Secondary | ICD-10-CM | POA: Diagnosis not present

## 2022-04-15 DIAGNOSIS — U071 COVID-19: Secondary | ICD-10-CM | POA: Diagnosis not present

## 2022-04-15 DIAGNOSIS — R262 Difficulty in walking, not elsewhere classified: Secondary | ICD-10-CM | POA: Diagnosis not present

## 2022-04-15 DIAGNOSIS — R2681 Unsteadiness on feet: Secondary | ICD-10-CM | POA: Diagnosis not present

## 2022-04-15 DIAGNOSIS — R2689 Other abnormalities of gait and mobility: Secondary | ICD-10-CM | POA: Diagnosis not present

## 2022-04-16 DIAGNOSIS — R2689 Other abnormalities of gait and mobility: Secondary | ICD-10-CM | POA: Diagnosis not present

## 2022-04-16 DIAGNOSIS — R279 Unspecified lack of coordination: Secondary | ICD-10-CM | POA: Diagnosis not present

## 2022-04-16 DIAGNOSIS — R2681 Unsteadiness on feet: Secondary | ICD-10-CM | POA: Diagnosis not present

## 2022-04-16 DIAGNOSIS — M1990 Unspecified osteoarthritis, unspecified site: Secondary | ICD-10-CM | POA: Diagnosis not present

## 2022-04-16 DIAGNOSIS — R262 Difficulty in walking, not elsewhere classified: Secondary | ICD-10-CM | POA: Diagnosis not present

## 2022-04-16 DIAGNOSIS — M6281 Muscle weakness (generalized): Secondary | ICD-10-CM | POA: Diagnosis not present

## 2022-04-16 DIAGNOSIS — U071 COVID-19: Secondary | ICD-10-CM | POA: Diagnosis not present

## 2022-04-16 DIAGNOSIS — I4891 Unspecified atrial fibrillation: Secondary | ICD-10-CM | POA: Diagnosis not present

## 2022-04-17 DIAGNOSIS — R262 Difficulty in walking, not elsewhere classified: Secondary | ICD-10-CM | POA: Diagnosis not present

## 2022-04-17 DIAGNOSIS — M6281 Muscle weakness (generalized): Secondary | ICD-10-CM | POA: Diagnosis not present

## 2022-04-17 DIAGNOSIS — R2681 Unsteadiness on feet: Secondary | ICD-10-CM | POA: Diagnosis not present

## 2022-04-17 DIAGNOSIS — R2689 Other abnormalities of gait and mobility: Secondary | ICD-10-CM | POA: Diagnosis not present

## 2022-04-17 DIAGNOSIS — I4891 Unspecified atrial fibrillation: Secondary | ICD-10-CM | POA: Diagnosis not present

## 2022-04-17 DIAGNOSIS — R279 Unspecified lack of coordination: Secondary | ICD-10-CM | POA: Diagnosis not present

## 2022-04-17 DIAGNOSIS — U071 COVID-19: Secondary | ICD-10-CM | POA: Diagnosis not present

## 2022-04-17 DIAGNOSIS — M1990 Unspecified osteoarthritis, unspecified site: Secondary | ICD-10-CM | POA: Diagnosis not present

## 2022-04-19 DIAGNOSIS — I4891 Unspecified atrial fibrillation: Secondary | ICD-10-CM | POA: Diagnosis not present

## 2022-04-19 DIAGNOSIS — R2681 Unsteadiness on feet: Secondary | ICD-10-CM | POA: Diagnosis not present

## 2022-04-19 DIAGNOSIS — R2689 Other abnormalities of gait and mobility: Secondary | ICD-10-CM | POA: Diagnosis not present

## 2022-04-19 DIAGNOSIS — R279 Unspecified lack of coordination: Secondary | ICD-10-CM | POA: Diagnosis not present

## 2022-04-19 DIAGNOSIS — U071 COVID-19: Secondary | ICD-10-CM | POA: Diagnosis not present

## 2022-04-19 DIAGNOSIS — M1990 Unspecified osteoarthritis, unspecified site: Secondary | ICD-10-CM | POA: Diagnosis not present

## 2022-04-19 DIAGNOSIS — M6281 Muscle weakness (generalized): Secondary | ICD-10-CM | POA: Diagnosis not present

## 2022-04-19 DIAGNOSIS — R262 Difficulty in walking, not elsewhere classified: Secondary | ICD-10-CM | POA: Diagnosis not present

## 2022-04-20 DIAGNOSIS — R2689 Other abnormalities of gait and mobility: Secondary | ICD-10-CM | POA: Diagnosis not present

## 2022-04-20 DIAGNOSIS — U071 COVID-19: Secondary | ICD-10-CM | POA: Diagnosis not present

## 2022-04-20 DIAGNOSIS — I4891 Unspecified atrial fibrillation: Secondary | ICD-10-CM | POA: Diagnosis not present

## 2022-04-20 DIAGNOSIS — M1990 Unspecified osteoarthritis, unspecified site: Secondary | ICD-10-CM | POA: Diagnosis not present

## 2022-04-20 DIAGNOSIS — R2681 Unsteadiness on feet: Secondary | ICD-10-CM | POA: Diagnosis not present

## 2022-04-20 DIAGNOSIS — R262 Difficulty in walking, not elsewhere classified: Secondary | ICD-10-CM | POA: Diagnosis not present

## 2022-04-20 DIAGNOSIS — M6281 Muscle weakness (generalized): Secondary | ICD-10-CM | POA: Diagnosis not present

## 2022-04-20 DIAGNOSIS — R279 Unspecified lack of coordination: Secondary | ICD-10-CM | POA: Diagnosis not present

## 2022-04-21 DIAGNOSIS — M6281 Muscle weakness (generalized): Secondary | ICD-10-CM | POA: Diagnosis not present

## 2022-04-21 DIAGNOSIS — M1990 Unspecified osteoarthritis, unspecified site: Secondary | ICD-10-CM | POA: Diagnosis not present

## 2022-04-21 DIAGNOSIS — R262 Difficulty in walking, not elsewhere classified: Secondary | ICD-10-CM | POA: Diagnosis not present

## 2022-04-21 DIAGNOSIS — I4891 Unspecified atrial fibrillation: Secondary | ICD-10-CM | POA: Diagnosis not present

## 2022-04-21 DIAGNOSIS — U071 COVID-19: Secondary | ICD-10-CM | POA: Diagnosis not present

## 2022-04-21 DIAGNOSIS — R279 Unspecified lack of coordination: Secondary | ICD-10-CM | POA: Diagnosis not present

## 2022-04-21 DIAGNOSIS — R2681 Unsteadiness on feet: Secondary | ICD-10-CM | POA: Diagnosis not present

## 2022-04-21 DIAGNOSIS — R2689 Other abnormalities of gait and mobility: Secondary | ICD-10-CM | POA: Diagnosis not present

## 2022-04-23 DIAGNOSIS — R279 Unspecified lack of coordination: Secondary | ICD-10-CM | POA: Diagnosis not present

## 2022-04-23 DIAGNOSIS — R262 Difficulty in walking, not elsewhere classified: Secondary | ICD-10-CM | POA: Diagnosis not present

## 2022-04-23 DIAGNOSIS — R2681 Unsteadiness on feet: Secondary | ICD-10-CM | POA: Diagnosis not present

## 2022-04-23 DIAGNOSIS — I4891 Unspecified atrial fibrillation: Secondary | ICD-10-CM | POA: Diagnosis not present

## 2022-04-23 DIAGNOSIS — M1990 Unspecified osteoarthritis, unspecified site: Secondary | ICD-10-CM | POA: Diagnosis not present

## 2022-04-23 DIAGNOSIS — R2689 Other abnormalities of gait and mobility: Secondary | ICD-10-CM | POA: Diagnosis not present

## 2022-04-23 DIAGNOSIS — U071 COVID-19: Secondary | ICD-10-CM | POA: Diagnosis not present

## 2022-04-23 DIAGNOSIS — M6281 Muscle weakness (generalized): Secondary | ICD-10-CM | POA: Diagnosis not present

## 2022-04-25 DIAGNOSIS — R2689 Other abnormalities of gait and mobility: Secondary | ICD-10-CM | POA: Diagnosis not present

## 2022-04-25 DIAGNOSIS — R279 Unspecified lack of coordination: Secondary | ICD-10-CM | POA: Diagnosis not present

## 2022-04-25 DIAGNOSIS — M1990 Unspecified osteoarthritis, unspecified site: Secondary | ICD-10-CM | POA: Diagnosis not present

## 2022-04-25 DIAGNOSIS — M6281 Muscle weakness (generalized): Secondary | ICD-10-CM | POA: Diagnosis not present

## 2022-04-25 DIAGNOSIS — R262 Difficulty in walking, not elsewhere classified: Secondary | ICD-10-CM | POA: Diagnosis not present

## 2022-04-25 DIAGNOSIS — U071 COVID-19: Secondary | ICD-10-CM | POA: Diagnosis not present

## 2022-04-25 DIAGNOSIS — I4891 Unspecified atrial fibrillation: Secondary | ICD-10-CM | POA: Diagnosis not present

## 2022-04-25 DIAGNOSIS — R2681 Unsteadiness on feet: Secondary | ICD-10-CM | POA: Diagnosis not present

## 2022-04-26 DIAGNOSIS — I4891 Unspecified atrial fibrillation: Secondary | ICD-10-CM | POA: Diagnosis not present

## 2022-04-26 DIAGNOSIS — M1990 Unspecified osteoarthritis, unspecified site: Secondary | ICD-10-CM | POA: Diagnosis not present

## 2022-04-26 DIAGNOSIS — R262 Difficulty in walking, not elsewhere classified: Secondary | ICD-10-CM | POA: Diagnosis not present

## 2022-04-26 DIAGNOSIS — M6281 Muscle weakness (generalized): Secondary | ICD-10-CM | POA: Diagnosis not present

## 2022-04-26 DIAGNOSIS — R2689 Other abnormalities of gait and mobility: Secondary | ICD-10-CM | POA: Diagnosis not present

## 2022-04-26 DIAGNOSIS — U071 COVID-19: Secondary | ICD-10-CM | POA: Diagnosis not present

## 2022-04-26 DIAGNOSIS — R279 Unspecified lack of coordination: Secondary | ICD-10-CM | POA: Diagnosis not present

## 2022-04-26 DIAGNOSIS — R2681 Unsteadiness on feet: Secondary | ICD-10-CM | POA: Diagnosis not present

## 2022-04-27 DIAGNOSIS — M1990 Unspecified osteoarthritis, unspecified site: Secondary | ICD-10-CM | POA: Diagnosis not present

## 2022-04-27 DIAGNOSIS — M6281 Muscle weakness (generalized): Secondary | ICD-10-CM | POA: Diagnosis not present

## 2022-04-27 DIAGNOSIS — R2681 Unsteadiness on feet: Secondary | ICD-10-CM | POA: Diagnosis not present

## 2022-04-27 DIAGNOSIS — R2689 Other abnormalities of gait and mobility: Secondary | ICD-10-CM | POA: Diagnosis not present

## 2022-04-27 DIAGNOSIS — I4891 Unspecified atrial fibrillation: Secondary | ICD-10-CM | POA: Diagnosis not present

## 2022-04-27 DIAGNOSIS — U071 COVID-19: Secondary | ICD-10-CM | POA: Diagnosis not present

## 2022-04-27 DIAGNOSIS — R262 Difficulty in walking, not elsewhere classified: Secondary | ICD-10-CM | POA: Diagnosis not present

## 2022-04-27 DIAGNOSIS — R279 Unspecified lack of coordination: Secondary | ICD-10-CM | POA: Diagnosis not present

## 2022-04-28 DIAGNOSIS — M6281 Muscle weakness (generalized): Secondary | ICD-10-CM | POA: Diagnosis not present

## 2022-04-28 DIAGNOSIS — R2689 Other abnormalities of gait and mobility: Secondary | ICD-10-CM | POA: Diagnosis not present

## 2022-04-28 DIAGNOSIS — I4891 Unspecified atrial fibrillation: Secondary | ICD-10-CM | POA: Diagnosis not present

## 2022-04-28 DIAGNOSIS — M1990 Unspecified osteoarthritis, unspecified site: Secondary | ICD-10-CM | POA: Diagnosis not present

## 2022-04-28 DIAGNOSIS — R279 Unspecified lack of coordination: Secondary | ICD-10-CM | POA: Diagnosis not present

## 2022-04-28 DIAGNOSIS — R2681 Unsteadiness on feet: Secondary | ICD-10-CM | POA: Diagnosis not present

## 2022-04-28 DIAGNOSIS — U071 COVID-19: Secondary | ICD-10-CM | POA: Diagnosis not present

## 2022-04-28 DIAGNOSIS — R262 Difficulty in walking, not elsewhere classified: Secondary | ICD-10-CM | POA: Diagnosis not present

## 2022-04-29 DIAGNOSIS — R279 Unspecified lack of coordination: Secondary | ICD-10-CM | POA: Diagnosis not present

## 2022-04-29 DIAGNOSIS — M6281 Muscle weakness (generalized): Secondary | ICD-10-CM | POA: Diagnosis not present

## 2022-04-29 DIAGNOSIS — U071 COVID-19: Secondary | ICD-10-CM | POA: Diagnosis not present

## 2022-04-29 DIAGNOSIS — I4891 Unspecified atrial fibrillation: Secondary | ICD-10-CM | POA: Diagnosis not present

## 2022-04-29 DIAGNOSIS — M1990 Unspecified osteoarthritis, unspecified site: Secondary | ICD-10-CM | POA: Diagnosis not present

## 2022-04-29 DIAGNOSIS — R262 Difficulty in walking, not elsewhere classified: Secondary | ICD-10-CM | POA: Diagnosis not present

## 2022-04-29 DIAGNOSIS — R2681 Unsteadiness on feet: Secondary | ICD-10-CM | POA: Diagnosis not present

## 2022-04-29 DIAGNOSIS — R2689 Other abnormalities of gait and mobility: Secondary | ICD-10-CM | POA: Diagnosis not present

## 2022-04-30 DIAGNOSIS — R262 Difficulty in walking, not elsewhere classified: Secondary | ICD-10-CM | POA: Diagnosis not present

## 2022-04-30 DIAGNOSIS — I4891 Unspecified atrial fibrillation: Secondary | ICD-10-CM | POA: Diagnosis not present

## 2022-04-30 DIAGNOSIS — R279 Unspecified lack of coordination: Secondary | ICD-10-CM | POA: Diagnosis not present

## 2022-04-30 DIAGNOSIS — M1990 Unspecified osteoarthritis, unspecified site: Secondary | ICD-10-CM | POA: Diagnosis not present

## 2022-04-30 DIAGNOSIS — R2681 Unsteadiness on feet: Secondary | ICD-10-CM | POA: Diagnosis not present

## 2022-04-30 DIAGNOSIS — R2689 Other abnormalities of gait and mobility: Secondary | ICD-10-CM | POA: Diagnosis not present

## 2022-04-30 DIAGNOSIS — M6281 Muscle weakness (generalized): Secondary | ICD-10-CM | POA: Diagnosis not present

## 2022-04-30 DIAGNOSIS — U071 COVID-19: Secondary | ICD-10-CM | POA: Diagnosis not present

## 2022-05-03 DIAGNOSIS — I4891 Unspecified atrial fibrillation: Secondary | ICD-10-CM | POA: Diagnosis not present

## 2022-05-03 DIAGNOSIS — R262 Difficulty in walking, not elsewhere classified: Secondary | ICD-10-CM | POA: Diagnosis not present

## 2022-05-03 DIAGNOSIS — U071 COVID-19: Secondary | ICD-10-CM | POA: Diagnosis not present

## 2022-05-03 DIAGNOSIS — R279 Unspecified lack of coordination: Secondary | ICD-10-CM | POA: Diagnosis not present

## 2022-05-03 DIAGNOSIS — R2689 Other abnormalities of gait and mobility: Secondary | ICD-10-CM | POA: Diagnosis not present

## 2022-05-03 DIAGNOSIS — M6281 Muscle weakness (generalized): Secondary | ICD-10-CM | POA: Diagnosis not present

## 2022-05-03 DIAGNOSIS — M1990 Unspecified osteoarthritis, unspecified site: Secondary | ICD-10-CM | POA: Diagnosis not present

## 2022-05-03 DIAGNOSIS — R2681 Unsteadiness on feet: Secondary | ICD-10-CM | POA: Diagnosis not present

## 2022-05-04 DIAGNOSIS — R279 Unspecified lack of coordination: Secondary | ICD-10-CM | POA: Diagnosis not present

## 2022-05-04 DIAGNOSIS — R2681 Unsteadiness on feet: Secondary | ICD-10-CM | POA: Diagnosis not present

## 2022-05-04 DIAGNOSIS — R262 Difficulty in walking, not elsewhere classified: Secondary | ICD-10-CM | POA: Diagnosis not present

## 2022-05-04 DIAGNOSIS — U071 COVID-19: Secondary | ICD-10-CM | POA: Diagnosis not present

## 2022-05-04 DIAGNOSIS — R2689 Other abnormalities of gait and mobility: Secondary | ICD-10-CM | POA: Diagnosis not present

## 2022-05-04 DIAGNOSIS — M1990 Unspecified osteoarthritis, unspecified site: Secondary | ICD-10-CM | POA: Diagnosis not present

## 2022-05-04 DIAGNOSIS — I4891 Unspecified atrial fibrillation: Secondary | ICD-10-CM | POA: Diagnosis not present

## 2022-05-04 DIAGNOSIS — M6281 Muscle weakness (generalized): Secondary | ICD-10-CM | POA: Diagnosis not present

## 2022-05-05 DIAGNOSIS — R262 Difficulty in walking, not elsewhere classified: Secondary | ICD-10-CM | POA: Diagnosis not present

## 2022-05-05 DIAGNOSIS — M6281 Muscle weakness (generalized): Secondary | ICD-10-CM | POA: Diagnosis not present

## 2022-05-05 DIAGNOSIS — M1990 Unspecified osteoarthritis, unspecified site: Secondary | ICD-10-CM | POA: Diagnosis not present

## 2022-05-05 DIAGNOSIS — R2689 Other abnormalities of gait and mobility: Secondary | ICD-10-CM | POA: Diagnosis not present

## 2022-05-05 DIAGNOSIS — U071 COVID-19: Secondary | ICD-10-CM | POA: Diagnosis not present

## 2022-05-05 DIAGNOSIS — I4891 Unspecified atrial fibrillation: Secondary | ICD-10-CM | POA: Diagnosis not present

## 2022-05-05 DIAGNOSIS — R2681 Unsteadiness on feet: Secondary | ICD-10-CM | POA: Diagnosis not present

## 2022-05-05 DIAGNOSIS — R279 Unspecified lack of coordination: Secondary | ICD-10-CM | POA: Diagnosis not present

## 2022-05-06 DIAGNOSIS — U071 COVID-19: Secondary | ICD-10-CM | POA: Diagnosis not present

## 2022-05-06 DIAGNOSIS — M6281 Muscle weakness (generalized): Secondary | ICD-10-CM | POA: Diagnosis not present

## 2022-05-06 DIAGNOSIS — R2689 Other abnormalities of gait and mobility: Secondary | ICD-10-CM | POA: Diagnosis not present

## 2022-05-06 DIAGNOSIS — R262 Difficulty in walking, not elsewhere classified: Secondary | ICD-10-CM | POA: Diagnosis not present

## 2022-05-06 DIAGNOSIS — R2681 Unsteadiness on feet: Secondary | ICD-10-CM | POA: Diagnosis not present

## 2022-05-06 DIAGNOSIS — M1990 Unspecified osteoarthritis, unspecified site: Secondary | ICD-10-CM | POA: Diagnosis not present

## 2022-05-06 DIAGNOSIS — I4891 Unspecified atrial fibrillation: Secondary | ICD-10-CM | POA: Diagnosis not present

## 2022-05-06 DIAGNOSIS — R279 Unspecified lack of coordination: Secondary | ICD-10-CM | POA: Diagnosis not present

## 2022-05-07 DIAGNOSIS — R2689 Other abnormalities of gait and mobility: Secondary | ICD-10-CM | POA: Diagnosis not present

## 2022-05-07 DIAGNOSIS — R262 Difficulty in walking, not elsewhere classified: Secondary | ICD-10-CM | POA: Diagnosis not present

## 2022-05-07 DIAGNOSIS — U071 COVID-19: Secondary | ICD-10-CM | POA: Diagnosis not present

## 2022-05-07 DIAGNOSIS — I4891 Unspecified atrial fibrillation: Secondary | ICD-10-CM | POA: Diagnosis not present

## 2022-05-07 DIAGNOSIS — M6281 Muscle weakness (generalized): Secondary | ICD-10-CM | POA: Diagnosis not present

## 2022-05-07 DIAGNOSIS — M1990 Unspecified osteoarthritis, unspecified site: Secondary | ICD-10-CM | POA: Diagnosis not present

## 2022-05-07 DIAGNOSIS — R2681 Unsteadiness on feet: Secondary | ICD-10-CM | POA: Diagnosis not present

## 2022-05-07 DIAGNOSIS — R279 Unspecified lack of coordination: Secondary | ICD-10-CM | POA: Diagnosis not present

## 2022-05-08 DIAGNOSIS — I4891 Unspecified atrial fibrillation: Secondary | ICD-10-CM | POA: Diagnosis not present

## 2022-05-08 DIAGNOSIS — M6281 Muscle weakness (generalized): Secondary | ICD-10-CM | POA: Diagnosis not present

## 2022-05-08 DIAGNOSIS — R2681 Unsteadiness on feet: Secondary | ICD-10-CM | POA: Diagnosis not present

## 2022-05-08 DIAGNOSIS — M1990 Unspecified osteoarthritis, unspecified site: Secondary | ICD-10-CM | POA: Diagnosis not present

## 2022-05-08 DIAGNOSIS — U071 COVID-19: Secondary | ICD-10-CM | POA: Diagnosis not present

## 2022-05-08 DIAGNOSIS — R279 Unspecified lack of coordination: Secondary | ICD-10-CM | POA: Diagnosis not present

## 2022-05-08 DIAGNOSIS — R262 Difficulty in walking, not elsewhere classified: Secondary | ICD-10-CM | POA: Diagnosis not present

## 2022-05-08 DIAGNOSIS — R2689 Other abnormalities of gait and mobility: Secondary | ICD-10-CM | POA: Diagnosis not present

## 2022-05-10 DIAGNOSIS — U071 COVID-19: Secondary | ICD-10-CM | POA: Diagnosis not present

## 2022-05-10 DIAGNOSIS — R262 Difficulty in walking, not elsewhere classified: Secondary | ICD-10-CM | POA: Diagnosis not present

## 2022-05-10 DIAGNOSIS — M1990 Unspecified osteoarthritis, unspecified site: Secondary | ICD-10-CM | POA: Diagnosis not present

## 2022-05-10 DIAGNOSIS — R2681 Unsteadiness on feet: Secondary | ICD-10-CM | POA: Diagnosis not present

## 2022-05-10 DIAGNOSIS — M6281 Muscle weakness (generalized): Secondary | ICD-10-CM | POA: Diagnosis not present

## 2022-05-10 DIAGNOSIS — I4891 Unspecified atrial fibrillation: Secondary | ICD-10-CM | POA: Diagnosis not present

## 2022-05-10 DIAGNOSIS — R279 Unspecified lack of coordination: Secondary | ICD-10-CM | POA: Diagnosis not present

## 2022-05-10 DIAGNOSIS — R2689 Other abnormalities of gait and mobility: Secondary | ICD-10-CM | POA: Diagnosis not present

## 2022-05-11 DIAGNOSIS — M1990 Unspecified osteoarthritis, unspecified site: Secondary | ICD-10-CM | POA: Diagnosis not present

## 2022-05-11 DIAGNOSIS — R279 Unspecified lack of coordination: Secondary | ICD-10-CM | POA: Diagnosis not present

## 2022-05-11 DIAGNOSIS — R2689 Other abnormalities of gait and mobility: Secondary | ICD-10-CM | POA: Diagnosis not present

## 2022-05-11 DIAGNOSIS — R2681 Unsteadiness on feet: Secondary | ICD-10-CM | POA: Diagnosis not present

## 2022-05-11 DIAGNOSIS — I4891 Unspecified atrial fibrillation: Secondary | ICD-10-CM | POA: Diagnosis not present

## 2022-05-11 DIAGNOSIS — M6281 Muscle weakness (generalized): Secondary | ICD-10-CM | POA: Diagnosis not present

## 2022-05-11 DIAGNOSIS — U071 COVID-19: Secondary | ICD-10-CM | POA: Diagnosis not present

## 2022-05-11 DIAGNOSIS — R262 Difficulty in walking, not elsewhere classified: Secondary | ICD-10-CM | POA: Diagnosis not present

## 2022-05-12 DIAGNOSIS — U071 COVID-19: Secondary | ICD-10-CM | POA: Diagnosis not present

## 2022-05-12 DIAGNOSIS — R279 Unspecified lack of coordination: Secondary | ICD-10-CM | POA: Diagnosis not present

## 2022-05-12 DIAGNOSIS — I4891 Unspecified atrial fibrillation: Secondary | ICD-10-CM | POA: Diagnosis not present

## 2022-05-12 DIAGNOSIS — R2681 Unsteadiness on feet: Secondary | ICD-10-CM | POA: Diagnosis not present

## 2022-05-12 DIAGNOSIS — M6281 Muscle weakness (generalized): Secondary | ICD-10-CM | POA: Diagnosis not present

## 2022-05-12 DIAGNOSIS — M1990 Unspecified osteoarthritis, unspecified site: Secondary | ICD-10-CM | POA: Diagnosis not present

## 2022-05-12 DIAGNOSIS — R2689 Other abnormalities of gait and mobility: Secondary | ICD-10-CM | POA: Diagnosis not present

## 2022-05-12 DIAGNOSIS — R262 Difficulty in walking, not elsewhere classified: Secondary | ICD-10-CM | POA: Diagnosis not present

## 2022-05-14 DIAGNOSIS — M1990 Unspecified osteoarthritis, unspecified site: Secondary | ICD-10-CM | POA: Diagnosis not present

## 2022-05-14 DIAGNOSIS — R279 Unspecified lack of coordination: Secondary | ICD-10-CM | POA: Diagnosis not present

## 2022-05-14 DIAGNOSIS — R262 Difficulty in walking, not elsewhere classified: Secondary | ICD-10-CM | POA: Diagnosis not present

## 2022-05-14 DIAGNOSIS — R2681 Unsteadiness on feet: Secondary | ICD-10-CM | POA: Diagnosis not present

## 2022-05-14 DIAGNOSIS — U071 COVID-19: Secondary | ICD-10-CM | POA: Diagnosis not present

## 2022-05-14 DIAGNOSIS — M6281 Muscle weakness (generalized): Secondary | ICD-10-CM | POA: Diagnosis not present

## 2022-05-14 DIAGNOSIS — I4891 Unspecified atrial fibrillation: Secondary | ICD-10-CM | POA: Diagnosis not present

## 2022-05-14 DIAGNOSIS — R2689 Other abnormalities of gait and mobility: Secondary | ICD-10-CM | POA: Diagnosis not present

## 2022-05-16 DIAGNOSIS — U071 COVID-19: Secondary | ICD-10-CM | POA: Diagnosis not present

## 2022-05-16 DIAGNOSIS — R2689 Other abnormalities of gait and mobility: Secondary | ICD-10-CM | POA: Diagnosis not present

## 2022-05-16 DIAGNOSIS — R279 Unspecified lack of coordination: Secondary | ICD-10-CM | POA: Diagnosis not present

## 2022-05-16 DIAGNOSIS — I4891 Unspecified atrial fibrillation: Secondary | ICD-10-CM | POA: Diagnosis not present

## 2022-05-16 DIAGNOSIS — M6281 Muscle weakness (generalized): Secondary | ICD-10-CM | POA: Diagnosis not present

## 2022-05-16 DIAGNOSIS — M1990 Unspecified osteoarthritis, unspecified site: Secondary | ICD-10-CM | POA: Diagnosis not present

## 2022-05-16 DIAGNOSIS — R2681 Unsteadiness on feet: Secondary | ICD-10-CM | POA: Diagnosis not present

## 2022-05-16 DIAGNOSIS — R262 Difficulty in walking, not elsewhere classified: Secondary | ICD-10-CM | POA: Diagnosis not present

## 2022-05-17 DIAGNOSIS — I4891 Unspecified atrial fibrillation: Secondary | ICD-10-CM | POA: Diagnosis not present

## 2022-05-17 DIAGNOSIS — U071 COVID-19: Secondary | ICD-10-CM | POA: Diagnosis not present

## 2022-05-17 DIAGNOSIS — R262 Difficulty in walking, not elsewhere classified: Secondary | ICD-10-CM | POA: Diagnosis not present

## 2022-05-17 DIAGNOSIS — R2681 Unsteadiness on feet: Secondary | ICD-10-CM | POA: Diagnosis not present

## 2022-05-17 DIAGNOSIS — R2689 Other abnormalities of gait and mobility: Secondary | ICD-10-CM | POA: Diagnosis not present

## 2022-05-17 DIAGNOSIS — M6281 Muscle weakness (generalized): Secondary | ICD-10-CM | POA: Diagnosis not present

## 2022-05-17 DIAGNOSIS — R279 Unspecified lack of coordination: Secondary | ICD-10-CM | POA: Diagnosis not present

## 2022-05-17 DIAGNOSIS — M1990 Unspecified osteoarthritis, unspecified site: Secondary | ICD-10-CM | POA: Diagnosis not present

## 2022-05-18 DIAGNOSIS — R2689 Other abnormalities of gait and mobility: Secondary | ICD-10-CM | POA: Diagnosis not present

## 2022-05-18 DIAGNOSIS — M6281 Muscle weakness (generalized): Secondary | ICD-10-CM | POA: Diagnosis not present

## 2022-05-18 DIAGNOSIS — M1990 Unspecified osteoarthritis, unspecified site: Secondary | ICD-10-CM | POA: Diagnosis not present

## 2022-05-18 DIAGNOSIS — R279 Unspecified lack of coordination: Secondary | ICD-10-CM | POA: Diagnosis not present

## 2022-05-18 DIAGNOSIS — I4891 Unspecified atrial fibrillation: Secondary | ICD-10-CM | POA: Diagnosis not present

## 2022-05-18 DIAGNOSIS — R262 Difficulty in walking, not elsewhere classified: Secondary | ICD-10-CM | POA: Diagnosis not present

## 2022-05-18 DIAGNOSIS — U071 COVID-19: Secondary | ICD-10-CM | POA: Diagnosis not present

## 2022-05-18 DIAGNOSIS — R2681 Unsteadiness on feet: Secondary | ICD-10-CM | POA: Diagnosis not present

## 2022-05-19 DIAGNOSIS — R279 Unspecified lack of coordination: Secondary | ICD-10-CM | POA: Diagnosis not present

## 2022-05-19 DIAGNOSIS — R2689 Other abnormalities of gait and mobility: Secondary | ICD-10-CM | POA: Diagnosis not present

## 2022-05-19 DIAGNOSIS — M6281 Muscle weakness (generalized): Secondary | ICD-10-CM | POA: Diagnosis not present

## 2022-05-19 DIAGNOSIS — R2681 Unsteadiness on feet: Secondary | ICD-10-CM | POA: Diagnosis not present

## 2022-05-19 DIAGNOSIS — M1990 Unspecified osteoarthritis, unspecified site: Secondary | ICD-10-CM | POA: Diagnosis not present

## 2022-05-19 DIAGNOSIS — R262 Difficulty in walking, not elsewhere classified: Secondary | ICD-10-CM | POA: Diagnosis not present

## 2022-05-19 DIAGNOSIS — U071 COVID-19: Secondary | ICD-10-CM | POA: Diagnosis not present

## 2022-05-19 DIAGNOSIS — I4891 Unspecified atrial fibrillation: Secondary | ICD-10-CM | POA: Diagnosis not present

## 2022-07-14 DIAGNOSIS — K219 Gastro-esophageal reflux disease without esophagitis: Secondary | ICD-10-CM | POA: Diagnosis not present

## 2022-07-14 DIAGNOSIS — I4891 Unspecified atrial fibrillation: Secondary | ICD-10-CM | POA: Diagnosis not present

## 2022-07-14 DIAGNOSIS — G4089 Other seizures: Secondary | ICD-10-CM | POA: Diagnosis not present

## 2022-09-05 DIAGNOSIS — R059 Cough, unspecified: Secondary | ICD-10-CM | POA: Diagnosis not present

## 2022-11-08 DEATH — deceased
